# Patient Record
Sex: Female | Born: 1990 | Race: White | Hispanic: No | Marital: Single | Smoking: Never smoker
Health system: Southern US, Community
[De-identification: ages and names within clinical notes are randomized; demographics above are authoritative.]

## PROBLEM LIST (undated history)

## (undated) DIAGNOSIS — L0291 Cutaneous abscess, unspecified: Secondary | ICD-10-CM

## (undated) DIAGNOSIS — G43909 Migraine, unspecified, not intractable, without status migrainosus: Secondary | ICD-10-CM

## (undated) DIAGNOSIS — A6000 Herpesviral infection of urogenital system, unspecified: Secondary | ICD-10-CM

## (undated) DIAGNOSIS — Z9889 Other specified postprocedural states: Secondary | ICD-10-CM

## (undated) DIAGNOSIS — F329 Major depressive disorder, single episode, unspecified: Secondary | ICD-10-CM

## (undated) DIAGNOSIS — F319 Bipolar disorder, unspecified: Secondary | ICD-10-CM

## (undated) DIAGNOSIS — T8859XA Other complications of anesthesia, initial encounter: Secondary | ICD-10-CM

## (undated) DIAGNOSIS — F32A Depression, unspecified: Secondary | ICD-10-CM

## (undated) DIAGNOSIS — F419 Anxiety disorder, unspecified: Secondary | ICD-10-CM

## (undated) HISTORY — PX: CERVICAL DISC SURGERY: SHX588

## (undated) HISTORY — PX: BACK SURGERY: SHX140

## (undated) HISTORY — PX: MOUTH SURGERY: SHX715

---
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
## 2011-05-27 ENCOUNTER — Emergency Department: Payer: Self-pay | Admitting: Emergency Medicine

## 2011-07-07 ENCOUNTER — Ambulatory Visit: Payer: Self-pay | Admitting: Family Medicine

## 2011-11-13 ENCOUNTER — Observation Stay: Payer: Self-pay

## 2011-11-22 ENCOUNTER — Inpatient Hospital Stay: Payer: Self-pay | Admitting: Obstetrics and Gynecology

## 2011-11-22 LAB — CBC WITH DIFFERENTIAL/PLATELET
Basophil %: 0.2 %
Eosinophil #: 0.1 10*3/uL (ref 0.0–0.7)
Eosinophil %: 0.7 %
HCT: 38.4 % (ref 35.0–47.0)
HGB: 12.9 g/dL (ref 12.0–16.0)
Lymphocyte #: 1.8 10*3/uL (ref 1.0–3.6)
Lymphocyte %: 10.6 %
Monocyte #: 0.8 10*3/uL — ABNORMAL HIGH (ref 0.0–0.7)
Neutrophil #: 14.3 10*3/uL — ABNORMAL HIGH (ref 1.4–6.5)
Neutrophil %: 83.8 %
RBC: 4.12 10*6/uL (ref 3.80–5.20)

## 2011-11-23 LAB — URINALYSIS, COMPLETE
Blood: NEGATIVE
Leukocyte Esterase: NEGATIVE
Nitrite: NEGATIVE
Ph: 7 (ref 4.5–8.0)
Protein: NEGATIVE
Specific Gravity: 1.006 (ref 1.003–1.030)
WBC UR: 1 /HPF (ref 0–5)

## 2011-11-24 LAB — HEMATOCRIT: HCT: 34.9 % — ABNORMAL LOW (ref 35.0–47.0)

## 2011-11-25 LAB — URINE CULTURE

## 2012-10-02 ENCOUNTER — Emergency Department: Payer: Self-pay | Admitting: Emergency Medicine

## 2012-10-02 LAB — URINALYSIS, COMPLETE
Bilirubin,UR: NEGATIVE
Glucose,UR: NEGATIVE mg/dL (ref 0–75)
Ketone: NEGATIVE
Leukocyte Esterase: NEGATIVE
Ph: 5 (ref 4.5–8.0)
Squamous Epithelial: 2

## 2012-10-02 LAB — DRUG SCREEN, URINE
Amphetamines, Ur Screen: NEGATIVE (ref ?–1000)
Benzodiazepine, Ur Scrn: NEGATIVE (ref ?–200)
Cannabinoid 50 Ng, Ur ~~LOC~~: NEGATIVE (ref ?–50)
Cocaine Metabolite,Ur ~~LOC~~: NEGATIVE (ref ?–300)
MDMA (Ecstasy)Ur Screen: NEGATIVE (ref ?–500)
Opiate, Ur Screen: NEGATIVE (ref ?–300)
Phencyclidine (PCP) Ur S: NEGATIVE (ref ?–25)
Tricyclic, Ur Screen: NEGATIVE (ref ?–1000)

## 2012-10-02 LAB — COMPREHENSIVE METABOLIC PANEL
Albumin: 3.5 g/dL (ref 3.4–5.0)
Alkaline Phosphatase: 78 U/L (ref 50–136)
BUN: 10 mg/dL (ref 7–18)
Bilirubin,Total: 0.2 mg/dL (ref 0.2–1.0)
Calcium, Total: 8.9 mg/dL (ref 8.5–10.1)
Co2: 29 mmol/L (ref 21–32)
Creatinine: 0.78 mg/dL (ref 0.60–1.30)
EGFR (Non-African Amer.): >60
Osmolality: 283 (ref 275–301)
Potassium: 3.7 mmol/L (ref 3.5–5.1)
SGPT (ALT): 20 U/L (ref 12–78)
Sodium: 143 mmol/L (ref 136–145)

## 2012-10-02 LAB — CBC
HCT: 43.5 % (ref 35.0–47.0)
HGB: 14.6 g/dL (ref 12.0–16.0)
MCHC: 33.5 g/dL (ref 32.0–36.0)
Platelet: 315 10*3/uL (ref 150–440)
RBC: 4.83 10*6/uL (ref 3.80–5.20)

## 2012-10-02 LAB — ETHANOL: Ethanol: <3 mg/dL

## 2012-10-02 LAB — TSH: Thyroid Stimulating Horm: 1.71 u[IU]/mL

## 2012-10-02 LAB — ACETAMINOPHEN LEVEL: Acetaminophen: <2 ug/mL

## 2013-08-01 ENCOUNTER — Emergency Department: Payer: Self-pay | Admitting: Emergency Medicine

## 2014-05-25 ENCOUNTER — Emergency Department: Payer: Self-pay | Admitting: Emergency Medicine

## 2014-05-25 LAB — TROPONIN I: Troponin-I: <0.02 ng/mL

## 2014-05-25 LAB — BASIC METABOLIC PANEL
Anion Gap: 12 (ref 7–16)
BUN: 7 mg/dL (ref 7–18)
CHLORIDE: 105 mmol/L (ref 98–107)
Calcium, Total: 8.9 mg/dL (ref 8.5–10.1)
Co2: 24 mmol/L (ref 21–32)
Creatinine: 1.01 mg/dL (ref 0.60–1.30)
EGFR (Non-African Amer.): >60
Glucose: 95 mg/dL (ref 65–99)
Osmolality: 279 (ref 275–301)
POTASSIUM: 3.3 mmol/L — AB (ref 3.5–5.1)
SODIUM: 141 mmol/L (ref 136–145)

## 2014-05-25 LAB — CBC
HCT: 43.7 % (ref 35.0–47.0)
HGB: 15.1 g/dL (ref 12.0–16.0)
MCH: 31 pg (ref 26.0–34.0)
MCHC: 34.6 g/dL (ref 32.0–36.0)
MCV: 90 fL (ref 80–100)
PLATELETS: 340 10*3/uL (ref 150–440)
RBC: 4.87 10*6/uL (ref 3.80–5.20)
RDW: 12.7 % (ref 11.5–14.5)
WBC: 10.9 10*3/uL (ref 3.6–11.0)

## 2014-05-30 ENCOUNTER — Ambulatory Visit (INDEPENDENT_AMBULATORY_CARE_PROVIDER_SITE_OTHER): Payer: BC Managed Care – PPO | Admitting: Cardiovascular Disease

## 2014-05-30 ENCOUNTER — Encounter (INDEPENDENT_AMBULATORY_CARE_PROVIDER_SITE_OTHER): Payer: Self-pay

## 2014-05-30 ENCOUNTER — Encounter: Payer: Self-pay | Admitting: Cardiovascular Disease

## 2014-05-30 VITALS — BP 118/82 | HR 100 | Ht 63.0 in | Wt 163.2 lb

## 2014-05-30 DIAGNOSIS — R0789 Other chest pain: Secondary | ICD-10-CM | POA: Insufficient documentation

## 2014-05-30 DIAGNOSIS — F411 Generalized anxiety disorder: Secondary | ICD-10-CM

## 2014-05-30 DIAGNOSIS — F419 Anxiety disorder, unspecified: Secondary | ICD-10-CM | POA: Insufficient documentation

## 2014-05-30 DIAGNOSIS — R0602 Shortness of breath: Secondary | ICD-10-CM | POA: Insufficient documentation

## 2014-05-30 DIAGNOSIS — R000 Tachycardia, unspecified: Secondary | ICD-10-CM | POA: Insufficient documentation

## 2014-05-30 DIAGNOSIS — F410 Panic disorder [episodic paroxysmal anxiety] without agoraphobia: Secondary | ICD-10-CM

## 2014-05-30 NOTE — Progress Notes (Signed)
Patient ID: [name], female    DOB: 28-Sep-1991, 23 y.o.   MRN: [id number]  HPI Comments: [name] is a pleasant 23 year old woman with long history of general anxiety disorder, depression, migraines, seen at the [facility] who presents to the office for new patient evaluation after being seen in the emergency room for chest pain.  She states that for several years she has had difficulty with anxiety and insomnia.  Symptoms have been worse in the past month or so since her brother moved into her mother's house. Now there are 3 families living under one roof. The patient and HER-22-year-old child share one room, brother and their child share another room. She states it is very stressful. There are frequent arguments. [place] and does not like to go walking outside her house as she is frequently approached by strange men. He does not have a car and unable to work at this time. She does not have the financial means or transportation to enjoy herself. She states that her father is relatively supportive, mother is not very supportive and often has many complaints of her own.  She was previously in therapy, previously on medications including Remeron for sleep and depression but this made her feel like a zombie.  In the past month has been having chest squeezing, heart pounding, episodes of shortness of breath even at rest, palpitations, episodes of dizziness, worsening headaches, insomnia.  She was seen in the emergency room 05/25/2014 for the symptoms above. Workup was essentially normal, potassium 3.3 otherwise everything was within normal limits EKG was normal, heart rate 100 beats per minute EKG today shows normal sinus rhythm with rate 97 beats per minute with no significant ST or T wave changes  From the emergency room she was given Toradol, Valium and Phenergan for nausea. This has not been helping her symptoms She reports that she also has a prescription  for Flexeril and this has also not been helping her chest tightness  Orthostatics were done that showed mild drop in her pressure from supine to standing position, change from 093 systolic down to 235, minimal increase in her heart rate up from 96 beats per minute up to 102   Outpatient Encounter Prescriptions as of 05/30/2014  Medication Sig  . diazepam (VALIUM) 5 MG tablet Take 5 mg by mouth every 6 (six) hours as needed.   Marland Kitchen ketorolac (TORADOL) 10 MG tablet Take 10 mg by mouth every 6 (six) hours.   . promethazine (PHENERGAN) 25 MG tablet Take 25 mg by mouth every 6 (six) hours as needed for nausea or vomiting.     Review of Systems  Constitutional: Negative.   HENT: Negative.   Eyes: Negative.   Respiratory: Positive for chest tightness and shortness of breath.   Cardiovascular: Positive for chest pain.  Gastrointestinal: Negative.   Endocrine: Negative.   Musculoskeletal: Negative.   Skin: Negative.   Allergic/Immunologic: Negative.   Neurological: Positive for dizziness.  Hematological: Negative.   Psychiatric/Behavioral: Positive for sleep disturbance, dysphoric mood and decreased concentration. The patient is nervous/anxious.   All other systems reviewed and are negative.   BP 118/82  Pulse 100  Ht $R'5\' 3"'yE$  (1.6 m)  Wt 163 lb 4 oz (74.05 kg)  BMI 28.93 kg/m2  Physical Exam  Nursing note and vitals reviewed. Constitutional: She is oriented to person, place, and time. She appears well-developed and well-nourished.  HENT:  Head: Normocephalic.  Nose: Nose  normal.  Mouth/Throat: Oropharynx is clear and moist.  Eyes: Conjunctivae are normal. Pupils are equal, round, and reactive to light.  Neck: Normal range of motion. Neck supple. No JVD present.  Cardiovascular: Normal rate, regular rhythm, normal heart sounds and intact distal pulses.  Exam reveals no gallop and no friction rub.   No murmur heard. Pulmonary/Chest: Effort normal and breath sounds normal. No respiratory  distress. She has no wheezes. She has no rales. She exhibits no tenderness.  Abdominal: Soft. Bowel sounds are normal. She exhibits no distension. There is no tenderness.  Musculoskeletal: Normal range of motion. She exhibits no edema and no tenderness.  Lymphadenopathy:    She has no cervical adenopathy.  Neurological: She is alert and oriented to person, place, and time. Coordination normal.  Skin: Skin is warm and dry. No rash noted. No erythema.  Psychiatric: She has a normal mood and affect. Her behavior is normal. Judgment and thought content normal.    Assessment and Plan

## 2014-05-30 NOTE — Assessment & Plan Note (Signed)
She reports having a history of panic attack. One episode while she was at her last job. Suspect she will need medical management with prescription medications given the severity of her symptoms

## 2014-05-30 NOTE — Assessment & Plan Note (Signed)
Shortness of breath here in the exam room at rest likely from anxiety and stress at home. Unable to do daily exercise given transportation issues. Will likely require pharmacologic therapy

## 2014-05-30 NOTE — Patient Instructions (Addendum)
Your heart is doing well No medication changes were made.  Numbers for psychiatry: [facility] [address]  [place] [phone] Walk-in clinic Mon, Wed, Fri 8-3  Download the apps on your phone: Search for "pulse meter" Download "instant heart rate"

## 2014-05-30 NOTE — Assessment & Plan Note (Signed)
She appreciates tachycardia. She'll monitor her heart rate using her phone. Sinus tachycardia noted in the office today. Again likely from anxiety. If she does appreciate heart rates more than 130 at rest, suggested she call us. Holter monitor could be ordered

## 2014-05-30 NOTE — Assessment & Plan Note (Signed)
Most of her symptoms are likely secondary to underlying anxiety and stress at home. Also suspect she has general anxiety disorder. Clinical exam is essentially benign, normal EKG. No significant risk factors, no smoking, no diabetes. No further workup at this time

## 2014-05-30 NOTE — Assessment & Plan Note (Signed)
Long history of anxiety per the patient. Recommended she follow up with psychiatry. Number will be provided today

## 2014-11-08 ENCOUNTER — Inpatient Hospital Stay: Payer: Self-pay | Admitting: Internal Medicine

## 2014-11-08 LAB — CBC WITH DIFFERENTIAL/PLATELET
Basophil #: 0 10*3/uL (ref 0.0–0.1)
Basophil %: 0.2 %
Eosinophil #: 0 10*3/uL (ref 0.0–0.7)
Eosinophil %: 0 %
HCT: 42.7 % (ref 35.0–47.0)
HGB: 14.4 g/dL (ref 12.0–16.0)
LYMPHS ABS: 2.1 10*3/uL (ref 1.0–3.6)
LYMPHS PCT: 17.2 %
MCH: 30.7 pg (ref 26.0–34.0)
MCHC: 33.7 g/dL (ref 32.0–36.0)
MCV: 91 fL (ref 80–100)
Monocyte #: 1.1 x10 3/mm — ABNORMAL HIGH (ref 0.2–0.9)
Monocyte %: 8.5 %
Neutrophil #: 9.2 10*3/uL — ABNORMAL HIGH (ref 1.4–6.5)
Neutrophil %: 74.1 %
Platelet: 303 10*3/uL (ref 150–440)
RBC: 4.69 10*6/uL (ref 3.80–5.20)
RDW: 12.7 % (ref 11.5–14.5)
WBC: 12.4 10*3/uL — AB (ref 3.6–11.0)

## 2014-11-08 LAB — URINALYSIS, COMPLETE
RBC,UR: 373 /HPF (ref 0–5)
Specific Gravity: 1.027 (ref 1.003–1.030)
Squamous Epithelial: 150
WBC UR: 6329 /HPF (ref 0–5)

## 2014-11-08 LAB — COMPREHENSIVE METABOLIC PANEL
ALBUMIN: 4 g/dL (ref 3.4–5.0)
ALK PHOS: 86 U/L
Anion Gap: 10 (ref 7–16)
BUN: 13 mg/dL (ref 7–18)
Bilirubin,Total: 0.7 mg/dL (ref 0.2–1.0)
CREATININE: 1.11 mg/dL (ref 0.60–1.30)
Calcium, Total: 9.1 mg/dL (ref 8.5–10.1)
Chloride: 103 mmol/L (ref 98–107)
Co2: 25 mmol/L (ref 21–32)
EGFR (African American): >60
EGFR (Non-African Amer.): >60
GLUCOSE: 103 mg/dL — AB (ref 65–99)
OSMOLALITY: 276 (ref 275–301)
Potassium: 3.8 mmol/L (ref 3.5–5.1)
SGOT(AST): 27 U/L (ref 15–37)
SGPT (ALT): 26 U/L
SODIUM: 138 mmol/L (ref 136–145)
TOTAL PROTEIN: 8.4 g/dL — AB (ref 6.4–8.2)

## 2014-11-08 LAB — PREGNANCY, URINE: Pregnancy Test, Urine: NEGATIVE m[IU]/mL

## 2014-11-09 LAB — BASIC METABOLIC PANEL
ANION GAP: 6 — AB (ref 7–16)
BUN: 10 mg/dL (ref 7–18)
CO2: 27 mmol/L (ref 21–32)
Calcium, Total: 7.9 mg/dL — ABNORMAL LOW (ref 8.5–10.1)
Chloride: 107 mmol/L (ref 98–107)
Creatinine: 0.95 mg/dL (ref 0.60–1.30)
EGFR (African American): >60
EGFR (Non-African Amer.): >60
Glucose: 110 mg/dL — ABNORMAL HIGH (ref 65–99)
Osmolality: 279 (ref 275–301)
Potassium: 3.8 mmol/L (ref 3.5–5.1)
SODIUM: 140 mmol/L (ref 136–145)

## 2014-11-09 LAB — CBC WITH DIFFERENTIAL/PLATELET
Basophil #: 0 10*3/uL (ref 0.0–0.1)
Basophil %: 0.4 %
EOS PCT: 0.1 %
Eosinophil #: 0 10*3/uL (ref 0.0–0.7)
HCT: 36.5 % (ref 35.0–47.0)
HGB: 12.1 g/dL (ref 12.0–16.0)
LYMPHS ABS: 2.8 10*3/uL (ref 1.0–3.6)
Lymphocyte %: 33.1 %
MCH: 30.8 pg (ref 26.0–34.0)
MCHC: 33.3 g/dL (ref 32.0–36.0)
MCV: 93 fL (ref 80–100)
MONO ABS: 0.7 x10 3/mm (ref 0.2–0.9)
Monocyte %: 8.8 %
NEUTROS ABS: 4.9 10*3/uL (ref 1.4–6.5)
Neutrophil %: 57.6 %
Platelet: 212 10*3/uL (ref 150–440)
RBC: 3.95 10*6/uL (ref 3.80–5.20)
RDW: 12.6 % (ref 11.5–14.5)
WBC: 8.5 10*3/uL (ref 3.6–11.0)

## 2014-11-10 LAB — URINE CULTURE

## 2014-11-10 LAB — GC/CHLAMYDIA PROBE AMP

## 2014-11-10 LAB — ALT: SGPT (ALT): 43 U/L

## 2014-11-10 LAB — ALKALINE PHOSPHATASE: ALK PHOS: 70 U/L

## 2014-11-10 LAB — SGOT (AST)(ARMC): SGOT(AST): 42 U/L — ABNORMAL HIGH (ref 15–37)

## 2014-11-11 LAB — ALT: ALT: 48 U/L

## 2014-11-11 LAB — SGOT (AST)(ARMC): AST: 38 U/L — AB (ref 15–37)

## 2014-11-12 LAB — BASIC METABOLIC PANEL
Anion Gap: 6 — ABNORMAL LOW (ref 7–16)
BUN: 8 mg/dL (ref 7–18)
CALCIUM: 8.4 mg/dL — AB (ref 8.5–10.1)
Chloride: 108 mmol/L — ABNORMAL HIGH (ref 98–107)
Co2: 29 mmol/L (ref 21–32)
Creatinine: 0.81 mg/dL (ref 0.60–1.30)
EGFR (African American): >60
GLUCOSE: 79 mg/dL (ref 65–99)
Osmolality: 282 (ref 275–301)
POTASSIUM: 3.8 mmol/L (ref 3.5–5.1)
Sodium: 143 mmol/L (ref 136–145)

## 2014-11-12 LAB — CBC WITH DIFFERENTIAL/PLATELET
BASOS PCT: 0.5 %
Basophil #: 0 10*3/uL (ref 0.0–0.1)
EOS PCT: 3 %
Eosinophil #: 0.2 10*3/uL (ref 0.0–0.7)
HCT: 33.9 % — ABNORMAL LOW (ref 35.0–47.0)
HGB: 11.4 g/dL — AB (ref 12.0–16.0)
LYMPHS ABS: 2.5 10*3/uL (ref 1.0–3.6)
Lymphocyte %: 42.4 %
MCH: 30.7 pg (ref 26.0–34.0)
MCHC: 33.6 g/dL (ref 32.0–36.0)
MCV: 92 fL (ref 80–100)
Monocyte #: 0.6 x10 3/mm (ref 0.2–0.9)
Monocyte %: 9.6 %
NEUTROS ABS: 2.6 10*3/uL (ref 1.4–6.5)
NEUTROS PCT: 44.5 %
Platelet: 215 10*3/uL (ref 150–440)
RBC: 3.71 10*6/uL — AB (ref 3.80–5.20)
RDW: 12.3 % (ref 11.5–14.5)
WBC: 5.9 10*3/uL (ref 3.6–11.0)

## 2014-11-13 LAB — RAPID HIV SCREEN (HIV 1/2 AB+AG)

## 2014-11-15 LAB — BASIC METABOLIC PANEL
ANION GAP: 7 (ref 7–16)
BUN: 16 mg/dL (ref 7–18)
CO2: 28 mmol/L (ref 21–32)
Calcium, Total: 9.1 mg/dL (ref 8.5–10.1)
Chloride: 105 mmol/L (ref 98–107)
Creatinine: 0.94 mg/dL (ref 0.60–1.30)
EGFR (Non-African Amer.): >60
GLUCOSE: 90 mg/dL (ref 65–99)
Osmolality: 280 (ref 275–301)
Potassium: 4 mmol/L (ref 3.5–5.1)
Sodium: 140 mmol/L (ref 136–145)

## 2014-11-17 LAB — MISC AER/ANAEROBIC CULT.

## 2015-02-17 NOTE — Discharge Summary (Signed)
PATIENT NAME:  [name], [name] MR#:  [id number] DATE OF BIRTH:  1991-04-07  DATE OF ADMISSION:  11/08/2014 DATE OF DISCHARGE:  11/16/2014  DISCHARGE DIAGNOSES: 1.  Sepsis secondary to acute pyelonephritis.  2.  Vulvar edema with pustular lesions.  SECONDARY DIAGNOSES: Bipolar disorder with depression and anxiety.    CONSULTATIONS: 1.  Infectious disease, Stann MainlandDavid P. Sampson GoonFitzgerald, MD.  2.  OB/GYN, [place] Bingharlie Pickens, MD.  3.  Dermatology, [place] Astersavid A. Dasher, MD.   PROCEDURES AND RADIOLOGY:  CT scan of the abdomen and pelvis with contrast on January 23 showed no acute abnormality.   Major laboratory panel: UA on admission showed 6329 WBCs, 3+ bacteria.   Urine culture was contaminated.   Right thigh ulcer wound culture was consistent with normal flora.   Urine pregnancy test was negative. HIV antibodies were nonreactive. Chlamydia trachomatis was negative, along with gonorrhea which was also negative.   HSV IgG was negative. HSV IgM was negative. HSV IgM 1/2 ratio was elevated with a value of 2.86. HSV-1 and -2 DNA were negative.   RPR was nonreactive.   CMV antibodies were negative for IgM. CMV IgG was elevated with a value of 7.6. EBV IgM and IgG early antigen were negative, but EBV VCA antibody was elevated with a value of 61.3. EBV nuclear antigen antibodies IgG was elevated with a value of 410.   HISTORY AND SHORT HOSPITAL COURSE: The patient is a 24 year old female with the above-mentioned medical problems who was admitted for dysuria and right flank pain. She was found to have early sepsis secondary to acute pyelonephritis. Please see Dr. Thomasena Edisadhika Kalisetti'Ward dictated history and physical for further details. OB/GYN consultation was obtained with Dr. [place] Bingharlie Pickens for vulvar swelling and discharge, who felt this to be HSV infection and the patient was started on acyclovir. She was found to have multiple vesicular lesions worrisome for herpes simplex. The patient was not getting  significantly better with ongoing treatment with acyclovir for which infectious disease consultation was obtained along with dermatology consult. Dr. Sampson GoonFitzgerald recommended treating the patient'Ward sepsis due to UTI with ciprofloxacin, and he also recommended continuing acyclovir to finish the course for possible herpes simplex. Dermatology consultation was obtained with Dr. Pilar Plateavid Dasher from [facility], who recommended possible punch biopsy, but the patient refused. He recommended further different virus cultures which were ordered, some of which seemed to be positive for IgG antibodies. On the day of patient'Ward discharge, the patient remained afebrile for almost 48 hours before discharge and she was feeling a whole lot better by January 29. She was started on prednisone taper by Dr. Sampson GoonFitzgerald due to vulvar edema which was not improving much, and steroid did seem to help her.  DISCHARGE VITAL SIGNS: On the date of discharge, her vital signs were as follows: Temperature 98.2, heart rate 90 per minute, respirations 18 per minute, blood pressure 100/60. She was saturating 97% on room air.   PERTINENT PHYSICAL EXAMINATION ON THE DATE OF DISCHARGE:  CARDIOVASCULAR: S1, S2 normal. No murmurs, rubs, or gallop.  LUNGS: Clear to auscultation bilaterally. No wheezing, rales, rhonchi, or crepitation.  ABDOMEN: Soft, benign.  NEUROLOGIC: Nonfocal examination. GENITOURINARY: The patient'Ward vulvar area shows multiple 20 to 30 discreet 2 to 5 mm vesicular, some hemorrhagic, and erosions involving the vulva. Moderate vulvar edema also was noted.  All other physical examination remained at baseline.   DISCHARGE MEDICATIONS:   Medication Instructions  seroquel xr 400 mg oral tablet, extended release  1 tab(Ward) orally once a  day (at bedtime)   azo urinary pain relief 97.5 mg oral tablet  2 tab(Ward) orally every 6 hours, As Needed for UTI symptoms.    sprintec 35 mcg-0.25 mg oral tablet  1 tab(Ward) orally once a  day   ciprofloxacin 500 mg oral tablet  1 tab(Ward) orally every 12 hours x 5 days   acyclovir 400 mg oral tablet  1 tab(Ward) orally every 8 hours x 3 days   prednisone 10 mg oral tablet  Start at 30 mg and taper by 10 mg daily until complete    DISCHARGE DIET: Regular.   DISCHARGE ACTIVITY: As tolerated.  DISCHARGE INSTRUCTIONS AND FOLLOWUP: The patient was instructed to follow up with her primary care physician doctor in 1 week. She was instructed to follow up with Dr. Clydie Braun from infectious disease in 1 to 2 weeks, with Dr. Pilar Plate from [facility] in 2 to 4 weeks, and with [facility] in 4 to 6 weeks.  TOTAL TIME SPENT: On this patient was 45 minutes.   ____________________________ Ellamae Sia. Sherryll Burger, MD vss:ST D: 11/19/2014 22:56:11 ET T: 11/19/2014 23:32:44 ET JOB#: [id number]  cc: Camilah Spillman Ward. Sherryll Burger, MD, <Dictator> Primary care physician Stann Mainland. Sampson Goon, MD [place] Asters Dasher, MD [facility] Bing, MD  Ellamae Sia [facility] MD ELECTRONICALLY SIGNED 11/20/2014 10:11

## 2015-02-17 NOTE — H&P (Signed)
PATIENT NAME:  [name], [name] MR#:  [id number] DATE OF BIRTH:  11/10/1990  DATE OF ADMISSION:  11/08/2014  ADMITTING PHYSICIAN:  , MD .   PRIMARY CARE PHYSICIAN: Nonlocal.   CHIEF COMPLAINT: Dysuria and right flank pain.   HISTORY OF PRESENT ILLNESS: [name] is a 23-year-old Caucasian female with past medical history significant for bipolar, depression, anxiety, who presents to the hospital secondary to worsening dysuria and also right flank pain. The patient said she developed vaginal discharge about 4 days ago. She is not sexually active currently. She was asymptomatic. Two days after that she found to have significant dysuria, chills at home, so went to her physician and was started on Cipro twice a day. She just got it filled yesterday, started taking it, but her discomfort, dysuria has been worsening. She has nausea, unable to take her medications, very dehydrated, so presents to the hospital. Now she also has right-sided flank ankle. She is tachycardic, has a white and urine shows significant UTI. So she is being admitted for early sepsis from acute pyelonephritis.   PAST MEDICAL HISTORY: Bipolar disorder with depression and anxiety.   PAST SURGICAL HISTORY: None other than a dental surgery.   ALLERGIES TO MEDICATIONS: No known drug allergies.   CURRENT HOME MEDICATIONS:  1.  She was started on the Cipro 250 b.i.d., which she just started taking yesterday.  2.  She was also started on Azo for urinary pain relief, which she just started taking yesterday.  3.  She takes Seroquel XR 400 mg p.o. at bedtime.   SOCIAL HISTORY: Lives at home with her parents and also has a 3-year-old daughter. No smoking. Occasional alcohol use.   FAMILY HISTORY: Significant for cancer, diabetes and hypertension in the family.   REVIEW OF SYSTEMS: CONSTITUTIONAL: Positive for fever and chills. No fatigue or weakness.  EYES: No blurred vision, double vision, inflammation or glaucoma.   EARS, NOSE, AND THROAT: No tinnitus, ear pain, hearing loss, epistaxis or discharge.  RESPIRATORY: No cough, wheeze, hemoptysis or COPD.  CARDIOVASCULAR: No chest pain, orthopnea, edema, arrhythmia, palpitations or syncope.  GASTROINTESTINAL: Positive for nausea. No vomiting, diarrhea, abdominal pain, hematemesis, or melena.  GENITOURINARY: Significant dysuria, decreased frequency of urination. No hematuria, no incontinence.  ENDOCRINE: No polyuria, nocturia, thyroid problems, heat or cold intolerance.  HEMATOLOGY: No anemia, easy bruising or bleeding.  SKIN: No acne, rash or lesions.  MUSCULOSKELETAL: No neck, back, shoulder pain, arthritis or gout.  NEUROLOGIC: No numbness, weakness, CVA, TIA or seizures.  PSYCHOLOGICAL: No anxiety, insomnia, depression.   PHYSICAL EXAMINATION:  VITAL SIGNS: Temperature 98.2 degrees Fahrenheit, pulse 116, respirations 18, blood pressure 109/78, pulse of 99% on room air.  GENERAL: Well-built, well-nourished female lying in bed, not in any acute distress.  HEENT: Normocephalic, atraumatic. Pupils equal, round, reacting to light. Anicteric sclerae. Extraocular movements intact.  OROPHARYNX: Clear without erythema, mass or exudates.  NECK: Supple. No thyromegaly, JVD or carotid bruits. No lymphadenopathy. LUNGS: Moving air bilaterally. No wheeze or crackles. No use of accessory muscles for breathing.  CARDIOVASCULAR: S1, S2, regular rate and rhythm. No murmurs, rubs or gallops.  ABDOMEN: Soft, nontender, nondistended. Some discomfort in the right CVA with tenderness. No rebound tenderness. No guarding or rigidity. Normal bowel sounds.  EXTREMITIES: No pedal edema. No clubbing or cyanosis, 2+ dorsalis pedis pulses palpable bilaterally.  SKIN: No acne, rash or lesions.  LYMPHATICS: No cervical or inguinal lymphadenopathy.  NEUROLOGICAL: Cranial nerves intact. No focal motor or sensory deficits. PSYCHOLOGICAL:   The patient is awake, alert, oriented x 3.    LABORATORY DATA: WBC 12.4, hemoglobin 14.4, hematocrit 42.7, platelet count 303,000.   Sodium 138, potassium 3.8, chloride 103, bicarb 25, BUN 13, creatinine 1.1, glucose 103, calcium of 9.1.   ALT 26, AST 27, alk phos 86, total bili 0.7, albumin of 4.0. Pregnancy test is negative. Urinalysis with 3+ bacteria, 6000 wbc'Ward, epithelial cells noted. Urine was significantly orange, cloudy with mucus present. Lactic acid is within normal limits.   ASSESSMENT AND PLAN: A 23-year-old female with history of bipolar, depression admitted for acute pyelonephritis.  1.  Early sepsis secondary to acute pyelonephritis. Failed outpatient antibiotic treatment with Cipro. We will admit, blood and urine cultures, IV fluids, started on Pyridium for discomfort and also Rocephin. No CT done. If symptoms do not improve by tomorrow, consider CT abdomen.  2.  Bipolar with depression and anxiety. Stable. Continue on her Seroquel.   CODE STATUS: Full code.   TIME SPENT ON ADMISSION: Fifty minutes.    ____________________________  , MD rk:TT D: 11/08/2014 19:39:58 ET T: 11/08/2014 20:08:37 ET JOB#: [id number]  cc:  , MD, <Dictator>   MD ELECTRONICALLY SIGNED 11/11/2014 14:37 

## 2015-02-17 NOTE — Consult Note (Signed)
Chief Complaint:  Subjective/Chief Complaint Feels about the same.  Has noted no significant change in vulvar edema or lesions.   VITAL SIGNS/ANCILLARY NOTES: **Vital Signs.:   25-Jan-16 00:26  Vital Signs Type Q 8hr  Temperature Temperature (F) 98.2  Celsius 36.7  Temperature Source oral  Pulse Pulse 101  Respirations Respirations 18  Systolic BP Systolic BP 110  Diastolic BP (mmHg) Diastolic BP (mmHg) 65  Mean BP 80  Pulse Ox % Pulse Ox % 96  Pulse Ox Activity Level  At rest  Oxygen Delivery Room Air/ 21 %    07:53  Temperature Temperature (F) 98.3  Celsius 36.8  Pulse Pulse 93  Respirations Respirations 16  Systolic BP Systolic BP 104  Diastolic BP (mmHg) Diastolic BP (mmHg) 66  Mean BP 78  Pulse Ox % Pulse Ox % 96  Pulse Ox Activity Level  At rest  Oxygen Delivery Room Air/ 21 %   Brief Assessment:  GEN no acute distress   Additional Physical Exam HEENT: normal oropharynx, no oral lesions Genitourinary: Significant edema involving labia minora and majora with several vesicuar lesions as well as some small ulcerations involving labia minor, majora, and thighs.  Foley in place   Lab Results: Routine Hem:  21-Jan-16 17:19   WBC (CBC)  12.4  22-Jan-16 04:56   WBC (CBC) 8.5  25-Jan-16 05:18   WBC (CBC) 5.9   Assessment/Plan:  Assessment/Plan:  Assessment 24 yo with vulvar lesions being presumptively treated fo HSV   Plan - HSV PCR and IgG negative, IgM pending - Given no intercourse in the last year and negative IgG would have to consider etiologies outside of HSV.  These include vulvar Bahcet's disease, staph infection, or candida.  I will place a dermatology consult to get their input while IgM is pending - at present continue current plan of managment   Electronic Signatures: Lorrene ReidStaebler, Goddess Gebbia M (MD)  (Signed 25-Jan-16 09:05)  Authored: Chief Complaint, VITAL SIGNS/ANCILLARY NOTES, Brief Assessment, Lab Results, Assessment/Plan   Last Updated: 25-Jan-16  09:05 by Lorrene ReidStaebler, Mekiah Cambridge M (MD)

## 2015-02-17 NOTE — Consult Note (Signed)
PATIENT NAME:  [name], [name] DATE OF BIRTH:  Nov 17, 1990  DATE OF CONSULTATION:  11/12/2014  REFERRING PHYSICIAN:  Enid Baasadhika Kalisetti, MD  CONSULTING PHYSICIAN:  Stann Mainlandavid P. Sampson GoonFitzgerald, MD  REASON FOR CONSULTATION:  Pyelonephritis and vaginal lesions.   HISTORY OF PRESENT ILLNESS:  This is a very pleasant 24 year old female with history of bipolar disorder and depression, who presented to the hospital January 21 with dysuria and right flank pain. She reports initially that she developed a mild vaginal discharge. She then started to have some dysuria and presented to her primary care office. They started her on ciprofloxacin. However, the dysuria and flank pain worsened, and she developed nausea and decreased p.o. intake. She also developed some swelling on her external genitalia. She was admitted January 21 with tachycardia and increased white count. Since then, she has been seen by gynecology. Clinically, she reports some mild improvement in her dysuria and back pain, but her vaginal swelling persists.   PAST MEDICAL HISTORY: Bipolar with depression and anxiety.   PAST SURGICAL HISTORY:  None.   ALLERGIES:  No known drug allergies.   SOCIAL HISTORY:  Lives with her parents and her 23-year-old daughter. She is seeking work and just had a job interview. She does not smoke. Occasional alcohol use.   FAMILY HISTORY:  Noncontributory.   REVIEW OF SYSTEMS:  Eleven systems reviewed and negative except as per HPI.  PHYSICAL EXAMINATION: VITAL SIGNS:  Temperature 98.3, pulse 93, blood pressure 104/66, respirations 16, saturation 96% on room air. Maximum temperature since admission was 102 on January 23.  GENERAL:  She is pleasant, interactive, and alert. She is walking around the room.  HEENT:  Pupils are reactive. Extraocular movements are intact. Sclerae are anicteric. Oropharynx is clear.  NECK:  Supple.  HEART:  Regular.  LUNGS:  Clear.  ABDOMEN:  Soft, nontender,  nondistended. No hepatosplenomegaly.  EXTREMITIES:  No clubbing, cyanosis, or edema.  GENITOURINARY:  She has a Foley catheter in place. There is marked swelling of her vulva and perineum. She has what appear to be pedunculated umbilicated lesions on her labia and perineum. There is no active ulceration.  NEUROLOGIC:  She is alert and oriented x 3. Grossly nonfocal neurological exam.   LABORATORY DATA:  White blood count on admission was 12.4 and currently 5.9, hemoglobin 11.4, and platelets 215,000. Her renal function shows a creatinine of 0.81. LFTs were normal. Urinalysis done January 21 showed 6329 white cells, 373 red cells, and 3+ bacteria. Mucus was present. HSV swab of the genital lesions was negative by PCR. HSV-1 and HSV-2 IgG antibodies were negative. HSV type specific IgM antibodies are pending. HIV testing was not done. Gonorrhea and Chlamydia Gen-Probe were negative. Pregnancy test was negative.   CT of the abdomen and pelvis showed no acute abdominal abnormalities.   IMPRESSION:  This is a 24 year old female admitted with pyelonephritis. She also has marked labial swelling and erythema. She has umbilicated lesions on her genitalia as well. This appears somewhat like molluscum contagiosum.   RECOMMENDATIONS: 1.  Continue current antibiotics.  2.  Check HIV/RPR 3.  Continue local care of the lesions per gynecology. 4.  Would discontinue acyclovir, as PCR is negative.  5.  Start cipro for UTI (was initially on ceftriaxone but changed to keflex)   ____________________________ Stann Mainlandavid P. Sampson GoonFitzgerald, MD dpf:nb D: 11/12/2014 21:35:42 ET T: 11/12/2014 23:22:06 ET JOB#: [id number]  cc: Stann Mainlandavid P. Sampson GoonFitzgerald, MD, <Dictator> Kelda Azad Sampson GoonFITZGERALD MD ELECTRONICALLY SIGNED 11/13/2014 9:52

## 2015-02-17 NOTE — Consult Note (Signed)
OBGYN Consult Noteof Consult: 1/22/16of Admission 1/21/2015Service: Medicine, Dr. Maryruth HancockGouruOBGYN: none complaint: vulvar swelling23 y/o G1P1001 (LMP 3 weeks ago) with the above CC. PMHx unremarkable. Patient had UTI s/s, such as dysuria, starting on 1/19, with some clear/white, non malodorous vaginal discharge starting a two days prior; she sometimes has this type of discharge prior to her periods, so she thought it was that.  She was prescribed cipro on 1/19 but her s/s got worse so she was further evaluated in the ER and was admitted for UTI/pyelo and early sepsis and started on rocephin.  She states that she feels better and denies any constitutional s/s and her right sided back pain is gone. TSVD 2013, no problems. Periods are regular, qmonth but can be irregular if patient is stressed. Started OCPs a few days.bipolar, anxiety, depresionoralno female cancersno tobacco, rare etoh and no illicit drug use. single. not currently working. Last intercourse approximately 1 year agoprior to admission: OCP (pt unsure of name), seroquel, cipro  AF (Tmax: 98.7)  100s-120s  90s-130s/60s-80s (98/62, 96/64) 96/RA 18no inguinal LAD. vulva is edematous (RL 6x2.5x4cm swelling>LR 4 x 2 x 4cm) that appears to involve the whole vulva but doesn't track near her rectur. Vulva is very TTP and there is slight yellowish discharge (unclear if uretheral vs vaginal). Patient very uncomfortable with swabs for GC/CT and wet prep so bimanual or speculum exam not done.  Patient is shaven with several, diffuse, small well healed excoriations.  These appear to be healing well and not to be infected. RRR no MRGssoft, NTTP, ND, +BSno CVATno c/c/e WBC 8.5 from 12.4 and Cr 0.9 from 1.1. UPT negativeprep by me: rare fungal elements, normal squamous epithelium, numerous WBCs, no RBCs.none patient with new onset vulvar swelling and discharge, with pyelo; pt is currently stableof etiology. excoriations from shaving are well healed and don't appear to  be infected. PID is low on differential given no abdominal pain and negative abdominal exam and, per patient, no intercourse in last year. Also, patient states the swelling started prior to hospital admission and foley placement, which is a non latex one.  Possibly could be really bad candidasis infection.  Will come by later tonight and assess to ensure stability and start diflucan 150mg  po x 2 (72hours apart). If swelling gets worse or any other concerning features, will consider CT scan. Will follow up GC/CT swab.  of care d/w Dr. Amado CoeGouru      Electronic Signatures: [place] BingPickens, Aften Lipsey (MD) (Signed on 22-Jan-16 16:37)  Authored   Last Updated: 26-Jan-16 12:46 by [place] BingPickens, Ralph Benavidez (MD)

## 2015-02-17 NOTE — Consult Note (Signed)
Details:   - GYN Consult Note 24 y/o G1P1001 (LMP 4 weeks ago) HD#6 admitted for pyelo, early sepsis. GYN consulted HD#2 for vulvar swelling. Foley still in place S: patient states vulvar swelling and pain are stable (no worse/no better) O: AF VS normal and stable with just mild tachycardia in the low 100s NAD, patient crying/upset due to possibility of HSV infection GU: limited due to above; lesions (vs when I first consulted on her) look different with black/scab crusted over them; difficult to tell if they are weeping but don't appear to be. vulvar edema appears worse in that on the left side at the inferior aspect, it appears to more edematous. still ttp.  Labs: IgM combination HSV test +  A/P: pt stable Patient is s/p ID consult yesterday and Dermatology just got done seeing her with plans for biopsy. Per ID, the +test has possibility to be + due to cross-reactivity from other sources. Perplexing that HSV swab was negative, especially given all her other labs point to this as a primary infection, if this is indeed an HSV cause. Dermatology to come by and biopsy it again and possibly reswab. As for her lesions looking different to me today vs on initial consultation, it's likely that I saw them just before they worsened and hopefully since the left labia only looks slightly different and she, subjectively, states that her discomfort is stable, that her disease progression isn't worsening.  Will continue to follow; appreciate excellent care from primary service and consultants.   Electronic Signatures: [place] BingPickens, Jaquavious Mercer (MD)  (Signed 26-Jan-16 12:55)  Authored: Details   Last Updated: 26-Jan-16 12:55 by [place] BingPickens, Teiana Hajduk (MD)

## 2015-02-17 NOTE — Consult Note (Signed)
Chief Complaint:  Subjective/Chief Complaint Patient with decreased vulvar and lower abdominal pain today.  States that she is mostly tired.  Denies sexual intercourse for > 1 year.  She had a fever this morning to 102.  Has come down slightly now, but still febrile.  Denies history of HSV.  Catheter placed yesterday with return of 1,000 ml urine.  Now is clear.   VITAL SIGNS/ANCILLARY NOTES: **Vital Signs.:   23-Jan-16 00:57  Vital Signs Type Q 8hr  Temperature Temperature (F) 99.6  Celsius 37.5  Temperature Source oral  Pulse Pulse 109  Respirations Respirations 18  Systolic BP Systolic BP 120  Diastolic BP (mmHg) Diastolic BP (mmHg) 70  Mean BP 86  Pulse Ox % Pulse Ox % 95  Pulse Ox Activity Level  At rest  Oxygen Delivery Room Air/ 21 %    08:28  Vital Signs Type Q 8hr  Temperature Temperature (F) 102  Celsius 38.8  Temperature Source oral  Pulse Pulse 139  Respirations Respirations 18  Systolic BP Systolic BP 104  Diastolic BP (mmHg) Diastolic BP (mmHg) 68  Mean BP 80  Pulse Ox % Pulse Ox % 96  Pulse Ox Activity Level  At rest  Oxygen Delivery Room Air/ 21 %  *Intake and Output.:   23-Jan-16 00:46  Urine ml     Out:  325    04:03  Urine ml     Out:  475    Shift 07:00  Urine ml     Out:  800    Daily 07:00  Urine ml     Out:  2600    08:28  Urine ml     Out:  550    Shift 15:00  Urine ml     Out:  550   Brief Assessment:  GEN well developed, well nourished   Cardiac tachycardia, rhythm regular   Respiratory normal resp effort  clear BS  no use of accessory muscles   Gastrointestinal details normal Soft  Nontender  Nondistended  No masses palpable  Bowel sounds normal  No rebound tenderness  No gaurding   EXTR negative edema, no erythema   Additional Physical Exam GU: external genitalia edemetous with right labium minus greater than left.  Bilateral labiu majoria edematous.  This edema does not track and no inguinal lymphadenopathy noted.  multiple  vesicular lesions noted in various stages of developement (noted by RN to be new from yesterday and much worse).  No obvious lesions that are unroofed. Very tender to palpation.  mild erythema, foley catheter in place.  The vesicals are bilateral and include the surrounding vulva, labia majora and minora.   Lab Results: Hepatic:  21-Jan-16 17:19   Alkaline Phosphatase 86 (46-116 NOTE: New Reference Range 05/08/14)  SGPT (ALT) 26 (14-63 NOTE: New Reference Range 05/08/14)  SGOT (AST) 27  Routine Micro:  21-Jan-16 17:19   Micro Text Report URINE CULTURE   COMMENT                   MIXED BACTERIAL ORGANISMS   COMMENT                   RESULTS SUGGESTIVE OF CONTAMINATION   ANTIBIOTIC                       Specimen Source CLEAN [facility]  22-Jan-16 16:53   Micro Text Report CHLAM/N.GC RT-PCR ([facility])   CHLAMYDIA  CHLAMYDIA TRACHOMATIS NEGATIVE   N.GONORRHOEAE             N.GONORRHOEAE NEGATIVE   ANTIBIOTIC                       Specimen Source VAGINAL  Routine Chem:  21-Jan-16 17:19   Creatinine (comp) 1.11  22-Jan-16 04:56   Creatinine (comp) 0.95  Routine Sero:  21-Jan-16 17:19   Pregnancy Test, Urine NEGATIVE (The results of the qualitative urine HCG (Pregnancy Test) should be evaluated in light of other clinical information.  There are limitations to the test which, in certain clinical situations, may result in a false positive or negative result. Thehigh dose hook effect can occur in urine samples with extremely high HCG concentrations.  This effect can produce a negative result in certain situations. It is suggested that results of the qualitative HCG be confirmed by an alternate methodology, such as the quantitative serum beta HCG test.)  Routine Hem:  21-Jan-16 17:19   WBC (CBC)  12.4  Hemoglobin (CBC) 14.4  Hematocrit (CBC) 42.7  Platelet Count (CBC) 303  22-Jan-16 04:56   WBC (CBC) 8.5  Hemoglobin (CBC) 12.1  Hematocrit (CBC) 36.5  Platelet Count  (CBC) 212   Assessment/Plan:  Invasive Device Daily Assessment of Necessity:  Does the patient currently have any of the following indwelling devices? foley   Indwelling Urinary Catheter continued, requirement due to   Reason to continue Indwelling Urinary Catheter inability to void as documented when return of urine was 900 ml when placed   Assessment/Plan:  Assessment 1) Pyelonephritis with continued fever 2) urinary retention: likely secondary to vulvar/labial edema.  Likely to improve once able to reduce edema (See below) 3) multiple vesicles in various stages of progression - noted to be worse today by RN and per report from gynecologist seeing her yesterday during sign-out report.  These lesions are highly suggestive of a herpes simplex eruption.  The patient denies sexual intercourse in the past year and denies a history of a prior eruption of similar lesions.   Plan 1) pyelonephritis: per primary medicine team. 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  Also, will get serum HSV I-II IgG and IgM to get an idea of whether this is truly a primary outbreak or a longstanding infection.  Given severity of edema, is likely primary.  Recommend acyclovir  PO tid.  for edema, may give topical steroid of mild to medium strength.  For discomfort a topical anesthetic may also be given.  LFTs normal two days ago. But, will recheck as with primary HSV infection can be lead to end-organ dysfunction, including hepatitis.  If present, may require more aggressive therapy for this complicated version of the disease.   Discussed with general medicine attending of the day. Will continue to follow along and monitor.   Electronic Signatures: Conard Novak (MD)  (Signed 23-Jan-16 11:59)  Authored: Chief Complaint, VITAL SIGNS/ANCILLARY NOTES, Brief Assessment, Lab Results, Assessment/Plan   Last Updated: 23-Jan-16 11:59  by Conard Novak (MD)

## 2015-02-17 NOTE — Consult Note (Signed)
Chief Complaint:  Subjective/Chief Complaint patient feeling lethergic today and sore.  States the topical anesthetic used yesterday caused burning.  Has been afebrile since yesterday at around 11am.   VITAL SIGNS/ANCILLARY NOTES: **Vital Signs.:   24-Jan-16 01:29  Vital Signs Type Q 8hr  Celsius 37.6  Temperature Source oral  Pulse Pulse 122  Respirations Respirations 18  Systolic BP Systolic BP 105  Diastolic BP (mmHg) Diastolic BP (mmHg) 66  Mean BP 79  Pulse Ox % Pulse Ox % 93  Pulse Ox Activity Level  At rest  Oxygen Delivery Room Air/ 21 %    08:09  Vital Signs Type Q 8hr  Temperature Temperature (F) 99.7  Celsius 37.6  Temperature Source oral  Pulse Pulse 109  Respirations Respirations 18  Systolic BP Systolic BP 121  Diastolic BP (mmHg) Diastolic BP (mmHg) 74  Mean BP 89  Pulse Ox % Pulse Ox % 96  Pulse Ox Activity Level  At rest  Oxygen Delivery Room Air/ 21 %  *Intake and Output.:   24-Jan-16 01:22  Grand Totals Intake:  579 Output:  400    Net:  179 24 Hr.:  -1910    04:47  Grand Totals Intake:  393.01 Output:  600    Net:  -206.99 24 Hr.:  -2116.99    06:23  Grand Totals Intake:  0 Output:  200    Net:  -200 24 Hr.:  -2316.99    Shift 07:00  Grand Totals Intake:  972.01 Output:  1200    Net:  -227.99 24 Hr.:  -2316.99    Daily 07:00  Grand Totals Intake:  3033.01 Output:  5350    Net:  -2316.99 24 Hr.:  -2316.99    08:09  Grand Totals Intake:  0 Output:  250    Net:  -250 24 Hr.:  -250    08:58  Grand Totals Intake:  0 Output:      Net:  0 24 Hr.:  -250    09:10  Grand Totals Intake:  254 Output:      Net:  254 24 Hr.:  4    10:08  Grand Totals Intake:   Output:  325    Net:  -325 24 Hr.:  -321    12:45  Grand Totals Intake:   Output:  400    Net:  -400 24 Hr.:  -721    Shift 15:00  Grand Totals Intake:  254 Output:  975    Net:  -721 24 Hr.:  -721   Brief Assessment:  GEN well developed, well nourished   Cardiac  Regular Rate and Rhythm   Respiratory normal resp effort  clear BS  no use of accessory muscles   Gastrointestinal details normal Soft  Nontender  Nondistended  No masses palpable  Bowel sounds normal  No rebound tenderness  No gaurding   EXTR negative edema, no erythema   Additional Physical Exam GU: external genitalia edemetous with right labium minus greater than left.  Bilateral labiu majoria edematous.  This edema does not track and no inguinal lymphadenopathy noted.  multiple vesicular lesions noted in various stages of developement with increased crusting of lesions. None appeard unrood and ulcerated at this time. Very tender to palpation.  mild erythema decreased from yesterday, foley catheter in place.  The vesicals are bilateral and include the surrounding vulva, labia majora and minora.   Lab Results: Hepatic:  23-Jan-16 12:01   SGOT (AST)  42 (Result(s) reported on 10 Nov 2014 at 12:33PM.)  SGPT (ALT) 43 (14-63 NOTE: New Reference Range 05/08/14)  Alkaline Phosphatase 70 (46-116 NOTE: New Reference Range 05/08/14)   Radiology Results: CT:    23-Jan-16 14:33, CT Abdomen and Pelvis With Contrast  CT Abdomen and Pelvis With Contrast   REASON FOR EXAM:    (1) fevers, pyelonephritis; (2) fevers, vulvar edema  COMMENTS:       PROCEDURE: CT  - CT ABDOMEN / PELVIS  W  - Nov 10 2014  2:33PM     CLINICAL DATA:  Fevers and vulvar lesions    EXAM:  CT ABDOMEN AND PELVIS WITH CONTRAST    TECHNIQUE:  Multidetector CT imaging of the abdomen and pelvis was performed  using the standard protocol following bolus administration of  intravenous contrast.  CONTRAST:  100 mL Omnipaque 300.    COMPARISON:  None.    FINDINGS:  Lung bases demonstrate mild bibasilar atelectatic changes. Very  minimal pleural effusions are seen.    The liver, gallbladder, spleen, adrenal glands and pancreas are all  normal in their CT appearance. Kidneys are well visualized  bilaterally and reveal a  normal enhancement pattern. No obstructive  changes are seen.    Bladder is well distended and demonstrates some air within. This is  likely related to the recent instrumentation. The catheter is noted  within the bladder. No pelvic mass lesion or sidewall abnormality is  noted. No pelvic fluid is noted. The appendix is within normal  limits. No acute bony abnormality is noted.     IMPRESSION:  No acute abnormality noted.      Electronically Signed    By: Alcide CleverMark  Lukens M.D.    On: 11/10/2014 14:38         Verified By: Phillips OdorMARK L. LUKENS, M.D.,   Assessment/Plan:  Invasive Device Daily Assessment of Necessity:  Does the patient currently have any of the following indwelling devices? foley   Indwelling Urinary Catheter continued, requirement due to   Reason to continue Indwelling Urinary Catheter inability to void as documented when return of urine was 900 ml when placed   Assessment/Plan:  Assessment 1) Pyelonephritis: afebrile since yesterday.   2) urinary retention: likely secondary to vulvar/labial edema.  Likely to improve once able to reduce edema (See below) 3) multiple vesicles in various stages of progression - These lesions are highly suggestive of a herpes simplex eruption.  The patient denies sexual intercourse in the past year and denies a history of a prior eruption of similar lesions.   Plan 1) pyelonephritis: per primary medicine team. 2) urinary retention: continue indwelling catheter at this time 3) likely HSV: pending HSV swab for PCR speciation.  Also, pending is serum HSV I-II IgG and IgM to get an idea of whether this is truly a primary outbreak or a longstanding infection.  Given severity of edema, is likely primary.  Recommend acyclovir 400mg  PO tid.  for edema, may use ice packs.  For discomfort and edema will switch to epifoam.  LFTs slightly elevated yesterday. But, will recheck as with primary HSV infection can be lead to end-organ dysfunction, including  hepatitis.  If present, may require more aggressive therapy for this complicated version of the disease.   Will continue to follow along and monitor.   Electronic Signatures: Conard NovakJackson, Angelyse Heslin D (MD)  (Signed 24-Jan-16 13:47)  Authored: Chief Complaint, VITAL SIGNS/ANCILLARY NOTES, Brief Assessment, Lab Results, Radiology Results, Assessment/Plan   Last Updated: 24-Jan-16 13:47 by Conard NovakJackson, Haris Baack D (MD)

## 2015-11-24 ENCOUNTER — Emergency Department
Admission: EM | Admit: 2015-11-24 | Discharge: 2015-11-24 | Disposition: A | Discharge status: Home / Self Care | Payer: Medicaid Other | Attending: Emergency Medicine | Admitting: Emergency Medicine

## 2015-11-24 ENCOUNTER — Emergency Department: Payer: Medicaid Other

## 2015-11-24 DIAGNOSIS — Z79899 Other long term (current) drug therapy: Secondary | ICD-10-CM | POA: Diagnosis not present

## 2015-11-24 DIAGNOSIS — F410 Panic disorder [episodic paroxysmal anxiety] without agoraphobia: Secondary | ICD-10-CM | POA: Diagnosis not present

## 2015-11-24 DIAGNOSIS — Z3202 Encounter for pregnancy test, result negative: Secondary | ICD-10-CM | POA: Diagnosis not present

## 2015-11-24 DIAGNOSIS — R079 Chest pain, unspecified: Secondary | ICD-10-CM

## 2015-11-24 HISTORY — DX: Herpesviral infection of urogenital system, unspecified: A60.00

## 2015-11-24 LAB — BASIC METABOLIC PANEL
Anion gap: 7 (ref 5–15)
BUN: 10 mg/dL (ref 6–20)
CHLORIDE: 106 mmol/L (ref 101–111)
CO2: 25 mmol/L (ref 22–32)
Calcium: 9.5 mg/dL (ref 8.9–10.3)
Creatinine, Ser: 1.05 mg/dL — ABNORMAL HIGH (ref 0.44–1.00)
GFR calc Af Amer: >60 mL/min (ref >60)
GLUCOSE: 97 mg/dL (ref 65–99)
POTASSIUM: 3.5 mmol/L (ref 3.5–5.1)
Sodium: 138 mmol/L (ref 135–145)

## 2015-11-24 LAB — CBC
HEMATOCRIT: 42.1 % (ref 35.0–47.0)
Hemoglobin: 14.3 g/dL (ref 12.0–16.0)
MCH: 30 pg (ref 26.0–34.0)
MCHC: 34.1 g/dL (ref 32.0–36.0)
MCV: 88 fL (ref 80.0–100.0)
Platelets: 289 10*3/uL (ref 150–440)
RBC: 4.79 MIL/uL (ref 3.80–5.20)
RDW: 14 % (ref 11.5–14.5)
WBC: 7.9 10*3/uL (ref 3.6–11.0)

## 2015-11-24 LAB — URINALYSIS COMPLETE WITH MICROSCOPIC (ARMC ONLY)
BACTERIA UA: NONE SEEN
Bilirubin Urine: NEGATIVE
Glucose, UA: NEGATIVE mg/dL
Hgb urine dipstick: NEGATIVE
Ketones, ur: NEGATIVE mg/dL
Nitrite: NEGATIVE
PH: 8 (ref 5.0–8.0)
PROTEIN: NEGATIVE mg/dL
Specific Gravity, Urine: 1.013 (ref 1.005–1.030)

## 2015-11-24 LAB — PREGNANCY, URINE: Preg Test, Ur: NEGATIVE

## 2015-11-24 LAB — TROPONIN I: Troponin I: <0.03 ng/mL (ref <0.031)

## 2015-11-24 MED ORDER — DIAZEPAM 5 MG/ML IJ SOLN
5.0000 mg | Freq: Once | INTRAMUSCULAR | Status: AC
  Administered 2015-11-24: 5 mg via INTRAVENOUS
  Filled 2015-11-24: qty 2

## 2015-11-24 MED ORDER — ONDANSETRON 4 MG PO TBDP: 4.0000 mg | ORAL_TABLET | Freq: Three times a day (TID) | ORAL | Status: DC | PRN

## 2015-11-24 MED ORDER — ONDANSETRON 8 MG PO TBDP
8.0000 mg | ORAL_TABLET | Freq: Once | ORAL | Status: AC
  Administered 2015-11-24: 8 mg via ORAL
  Filled 2015-11-24: qty 1

## 2015-11-24 NOTE — ED Notes (Signed)
Pt came to ED c/o chest pain that started at work. Pt reports nothing triggered the chest pain. Pt reports a pressure/squeezing feeling in the center of her chest. Pt reports feeling numb and tingling. Pt has history of anxiety attacks.

## 2015-11-24 NOTE — Discharge Instructions (Signed)
°Panic Attacks °Panic attacks are sudden, short-lived surges of severe anxiety, fear, or discomfort. They may occur for no reason when you are relaxed, when you are anxious, or when you are sleeping. Panic attacks may occur for a number of reasons:  °· Healthy people occasionally have panic attacks in extreme, life-threatening situations, such as war or natural disasters. Normal anxiety is a protective mechanism of the body that helps us react to danger (fight or flight response). °· Panic attacks are often seen with anxiety disorders, such as panic disorder, social anxiety disorder, generalized anxiety disorder, and phobias. Anxiety disorders cause excessive or uncontrollable anxiety. They may interfere with your relationships or other life activities. °· Panic attacks are sometimes seen with other mental illnesses, such as depression and posttraumatic stress disorder. °· Certain medical conditions, prescription medicines, and drugs of abuse can cause panic attacks. °SYMPTOMS  °Panic attacks start suddenly, peak within 20 minutes, and are accompanied by four or more of the following symptoms: °· Pounding heart or fast heart rate (palpitations). °· Sweating. °· Trembling or shaking. °· Shortness of breath or feeling smothered. °· Feeling choked. °· Chest pain or discomfort. °· Nausea or strange feeling in your stomach. °· Dizziness, light-headedness, or feeling like you will faint. °· Chills or hot flushes. °· Numbness or tingling in your lips or hands and feet. °· Feeling that things are not real or feeling that you are not yourself. °· Fear of losing control or going crazy. °· Fear of dying. °Some of these symptoms can mimic serious medical conditions. For example, you may think you are having a heart attack. Although panic attacks can be very scary, they are not life threatening. °DIAGNOSIS  °Panic attacks are diagnosed through an assessment by your health care provider. Your health care provider will ask  questions about your symptoms, such as where and when they occurred. Your health care provider will also ask about your medical history and use of alcohol and drugs, including prescription medicines. Your health care provider may order blood tests or other studies to rule out a serious medical condition. Your health care provider may refer you to a mental health professional for further evaluation. °TREATMENT  °· Most healthy people who have one or two panic attacks in an extreme, life-threatening situation will not require treatment. °· The treatment for panic attacks associated with anxiety disorders or other mental illness typically involves counseling with a mental health professional, medicine, or a combination of both. Your health care provider will help determine what treatment is best for you. °· Panic attacks due to physical illness usually go away with treatment of the illness. If prescription medicine is causing panic attacks, talk with your health care provider about stopping the medicine, decreasing the dose, or substituting another medicine. °· Panic attacks due to alcohol or drug abuse go away with abstinence. Some adults need professional help in order to stop drinking or using drugs. °HOME CARE INSTRUCTIONS  °· Take all medicines as directed by your health care provider.   °· Schedule and attend follow-up visits as directed by your health care provider. It is important to keep all your appointments. °SEEK MEDICAL CARE IF: °· You are not able to take your medicines as prescribed. °· Your symptoms do not improve or get worse. °SEEK IMMEDIATE MEDICAL CARE IF:  °· You experience panic attack symptoms that are different than your usual symptoms. °· You have serious thoughts about hurting yourself or others. °· You are taking medicine for panic attacks and   have a serious side effect. °MAKE SURE YOU: °· Understand these instructions. °· Will watch your condition. °· Will get help right away if you are not  doing well or get worse. °  °This information is not intended to replace advice given to you by your health care provider. Make sure you discuss any questions you have with your health care provider. °  °Document Released: 10/05/2005 Document Revised: 10/10/2013 Document Reviewed: 05/19/2013 °Elsevier Interactive Patient Education ©2016 [employer] °Nonspecific Chest Pain  °Chest pain can be caused by many different conditions. There is always a chance that your pain could be related to something serious, such as a heart attack or a blood clot in your lungs. Chest pain can also be caused by conditions that are not life-threatening. If you have chest pain, it is very important to follow up with your health care provider. °CAUSES  °Chest pain can be caused by: °· Heartburn. °· Pneumonia or bronchitis. °· Anxiety or stress. °· Inflammation around your heart (pericarditis) or lung (pleuritis or pleurisy). °· A blood clot in your lung. °· A collapsed lung (pneumothorax). It can develop suddenly on its own (spontaneous pneumothorax) or from trauma to the chest. °· Shingles infection (varicella-zoster virus). °· Heart attack. °· Damage to the bones, muscles, and cartilage that make up your chest wall. This can include: °· Bruised bones due to injury. °· Strained muscles or cartilage due to frequent or repeated coughing or overwork. °· Fracture to one or more ribs. °· Sore cartilage due to inflammation (costochondritis). °RISK FACTORS  °Risk factors for chest pain may include: °· Activities that increase your risk for trauma or injury to your chest. °· Respiratory infections or conditions that cause frequent coughing. °· Medical conditions or overeating that can cause heartburn. °· Heart disease or family history of heart disease. °· Conditions or health behaviors that increase your risk of developing a blood clot. °· Having had chicken pox (varicella zoster). °SIGNS AND SYMPTOMS °Chest pain can feel like: °· Burning or  tingling on the surface of your chest or deep in your chest. °· Crushing, pressure, aching, or squeezing pain. °· Dull or sharp pain that is worse when you move, cough, or take a deep breath. °· Pain that is also felt in your back, neck, shoulder, or arm, or pain that spreads to any of these areas. °Your chest pain may come and go, or it may stay constant. °DIAGNOSIS °Lab tests or other studies may be needed to find the cause of your pain. Your health care provider may have you take a test called an ambulatory ECG (electrocardiogram). An ECG records your heartbeat patterns at the time the test is performed. You may also have other tests, such as: °· Transthoracic echocardiogram (TTE). During echocardiography, sound waves are used to create a picture of all of the heart structures and to look at how blood flows through your heart. °· Transesophageal echocardiogram (TEE). This is a more advanced imaging test that obtains images from inside your body. It allows your health care provider to see your heart in finer detail. °· Cardiac monitoring. This allows your health care provider to monitor your heart rate and rhythm in real time. °· Holter monitor. This is a portable device that records your heartbeat and can help to diagnose abnormal heartbeats. It allows your health care provider to track your heart activity for several days, if needed. °· Stress tests. These can be done through exercise or by taking medicine that makes your heart   beat more quickly. °· Blood tests. °· Imaging tests. °TREATMENT  °Your treatment depends on what is causing your chest pain. Treatment may include: °· Medicines. These may include: °· Acid blockers for heartburn. °· Anti-inflammatory medicine. °· Pain medicine for inflammatory conditions. °· Antibiotic medicine, if an infection is present. °· Medicines to dissolve blood clots. °· Medicines to treat coronary artery disease. °· Supportive care for conditions that do not require medicines.  This may include: °· Resting. °· Applying heat or cold packs to injured areas. °· Limiting activities until pain decreases. °HOME CARE INSTRUCTIONS °· If you were prescribed an antibiotic medicine, finish it all even if you start to feel better. °· Avoid any activities that bring on chest pain. °· Do not use any tobacco products, including cigarettes, chewing tobacco, or electronic cigarettes. If you need help quitting, ask your health care provider. °· Do not drink alcohol. °· Take medicines only as directed by your health care provider. °· Keep all follow-up visits as directed by your health care provider. This is important. This includes any further testing if your chest pain does not go away. °· If heartburn is the cause for your chest pain, you may be told to keep your head raised (elevated) while sleeping. This reduces the chance that acid will go from your stomach into your esophagus. °· Make lifestyle changes as directed by your health care provider. These may include: °· Getting regular exercise. Ask your health care provider to suggest some activities that are safe for you. °· Eating a heart-healthy diet. A registered dietitian can help you to learn healthy eating options. °· Maintaining a healthy weight. °· Managing diabetes, if necessary. °· Reducing stress. °SEEK MEDICAL CARE IF: °· Your chest pain does not go away after treatment. °· You have a rash with blisters on your chest. °· You have a fever. °SEEK IMMEDIATE MEDICAL CARE IF:  °· Your chest pain is worse. °· You have an increasing cough, or you cough up blood. °· You have severe abdominal pain. °· You have severe weakness. °· You faint. °· You have chills. °· You have sudden, unexplained chest discomfort. °· You have sudden, unexplained discomfort in your arms, back, neck, or jaw. °· You have shortness of breath at any time. °· You suddenly start to sweat, or your skin gets clammy. °· You feel nauseous or you vomit. °· You suddenly feel  light-headed or dizzy. °· Your heart begins to beat quickly, or it feels like it is skipping beats. °These symptoms may represent a serious problem that is an emergency. Do not wait to see if the symptoms will go away. Get medical help right away. Call your local emergency services (911 in the [place] Do not drive yourself to the hospital. °  °This information is not intended to replace advice given to you by your health care provider. Make sure you discuss any questions you have with your health care provider. °  °Document Released: 07/15/2005 Document Revised: 10/26/2014 Document Reviewed: 05/11/2014 °Elsevier Interactive Patient Education ©2016 Elsevier Inc. ° °

## 2015-11-24 NOTE — ED Provider Notes (Signed)
[facility] Emergency Department Provider Note  ____________________________________________  Time seen: 12:05 PM  I have reviewed the triage vital signs and the nursing notes.   HISTORY  Chief Complaint Chest Pain    HPI [name] is a 25 y.o. female who complains of chest pain that started earlier today about 3 hours ago. It's been waxing and waning but constant since then. Since described as pressure and squeezing in the center of her chest, nonradiating, no associated shortness of breath diaphoresis or vomiting. She does report feeling paresthesias in her bilateral hands face and her entire body. Denies syncope but was having some dizziness. Pain is not exertional, not pleuritic. Has a history of similar symptoms from anxiety and panic attacks. Was sent to the ED by her coworkers due to dizziness.    Past Medical History  Diagnosis Date  . Genital herpes      Patient Active Problem List   Diagnosis Date Noted  . SOB (shortness of breath) 05/30/2014  . Other chest pain 05/30/2014  . Rapid heart beat 05/30/2014  . Panic attacks 05/30/2014  . Anxiety 05/30/2014     History reviewed. No pertinent past surgical history.   Current Outpatient Rx  Name  Route  Sig  Dispense  Refill  . DULoxetine (CYMBALTA) 20 MG capsule   Oral   Take 20 mg by mouth at bedtime.         . norgestimate-ethinyl estradiol (SPRINTEC 28) 0.25-35 MG-MCG tablet   Oral   Take 1 tablet by mouth at bedtime.         Marland Kitchen QUEtiapine (SEROQUEL XR) 400 MG 24 hr tablet   Oral   Take 400 mg by mouth at bedtime.            Allergies Review of patient's allergies indicates no known allergies.   Family History  Problem Relation Age of Onset  . Hyperlipidemia Mother   . Hypertension Mother   . Hyperlipidemia Father     Social History Social History  Substance Use Topics  . Smoking status: Never Smoker   . Smokeless tobacco: None  . Alcohol Use: Yes   Comment: Rare.     Review of Systems  Constitutional:   No fever or chills. No weight changes Eyes:   No blurry vision or double vision.  ENT:   No sore throat. Cardiovascular:   Positive as above chest pain. Respiratory:   No dyspnea or cough. Gastrointestinal:   Negative for abdominal pain, vomiting and diarrhea.  No BRBPR or melena. Genitourinary:   Negative for dysuria, urinary retention, bloody urine, or difficulty urinating. Musculoskeletal:   Negative for back pain. No joint swelling or pain. Skin:   Negative for rash. Neurological:   Negative for headaches, focal weakness or numbness. Psychiatric:  Positive anxiety.   Endocrine:  No hot/cold intolerance, changes in energy, or sleep difficulty.  10-point ROS otherwise negative.  ____________________________________________   PHYSICAL EXAM:  VITAL SIGNS: ED Triage Vitals  Enc Vitals Group     BP 11/24/15 1204 132/85 mmHg     Pulse Rate 11/24/15 1204 107     Resp 11/24/15 1204 20     Temp 11/24/15 1204 97.8 F (36.6 C)     Temp Source 11/24/15 1204 Oral     SpO2 11/24/15 1204 100 %     Weight 11/24/15 1204 190 lb (86.183 kg)     Height 11/24/15 1204  (1.6 m)     Head Cir --  Peak Flow --      Pain Score 11/24/15 1205 8     Pain Loc --      Pain Edu? --      Excl. in GC? --     Vital signs reviewed, nursing assessments reviewed.   Constitutional:   Alert and oriented. Well appearing and in no distress. Eyes:   No scleral icterus. No conjunctival pallor. PERRL. EOMI ENT   Head:   Normocephalic and atraumatic.   Nose:   No congestion/rhinnorhea. No septal hematoma   Mouth/Throat:   MMM, no pharyngeal erythema. No peritonsillar mass. No uvula shift.   Neck:   No stridor. No SubQ emphysema. No meningismus. Hematological/Lymphatic/Immunilogical:   No cervical lymphadenopathy. Cardiovascular:   RRR. Normal and symmetric distal pulses are present in all extremities. No murmurs, rubs, or  gallops. Respiratory:   Normal respiratory effort without tachypnea nor retractions. Breath sounds are clear and equal bilaterally. No wheezes/rales/rhonchi. Gastrointestinal:   Soft and nontender. No distention. There is no CVA tenderness.  No rebound, rigidity, or guarding. Genitourinary:   deferred Musculoskeletal:   Nontender with normal range of motion in all extremities. No joint effusions.  No lower extremity tenderness.  No edema. Neurologic:   Normal speech and language.  CN 2-10 normal. Motor grossly intact. No pronator drift.  Normal gait. No gross focal neurologic deficits are appreciated.  Skin:    Skin is warm, dry and intact. No rash noted.  No petechiae, purpura, or bullae. Psychiatric:   Mood and affect are normal. Speech and behavior are normal. Patient exhibits appropriate insight and judgment.  ____________________________________________    LABS (pertinent positives/negatives) (all labs ordered are listed, but only abnormal results are displayed) Labs Reviewed  BASIC METABOLIC PANEL - Abnormal; Notable for the following:    Creatinine, Ser 1.05 (*)    All other components within normal limits  CBC  TROPONIN I  URINALYSIS COMPLETEWITH MICROSCOPIC ([facility] ONLY)  PREGNANCY, URINE   ____________________________________________   EKG  Interpreted by me  Date: 11/24/2015  Rate: 97  Rhythm: normal sinus rhythm  QRS Axis: normal  Intervals: normal  ST/T Wave abnormalities: normal  Conduction Disutrbances: none  Narrative Interpretation: unremarkable      ____________________________________________    RADIOLOGY  Chest x-ray unremarkable  ____________________________________________   PROCEDURES   ____________________________________________   INITIAL IMPRESSION / ASSESSMENT AND PLAN / ED COURSE  Pertinent labs & imaging results that were available during my care of the patient were reviewed by me and considered in my medical decision making  (see chart for details).  Patient presents with nonspecific chest pain. History and physical exam are both reassuring.Considering the patient's symptoms, medical history, and physical examination today, I have low suspicion for ACS, PE, TAD, pneumothorax, carditis, mediastinitis, pneumonia, CHF, or sepsis. Heart score is low. Patient does appear to be anxious and we'll give her a dose of Ativan while checking labs.       ____________________________________________   FINAL CLINICAL IMPRESSION(S) / ED DIAGNOSES  Final diagnoses:  Panic attacks  Nonspecific chest pain      Sharman Cheek, MD 11/24/15 [phone]

## 2015-11-30 ENCOUNTER — Emergency Department
Admission: EM | Admit: 2015-11-30 | Discharge: 2015-12-01 | Disposition: A | Discharge status: Home / Self Care | Payer: Medicaid Other | Attending: Emergency Medicine | Admitting: Emergency Medicine

## 2015-11-30 ENCOUNTER — Emergency Department: Payer: Medicaid Other

## 2015-11-30 ENCOUNTER — Encounter: Payer: Self-pay | Admitting: Emergency Medicine

## 2015-11-30 DIAGNOSIS — Z793 Long term (current) use of hormonal contraceptives: Secondary | ICD-10-CM | POA: Diagnosis not present

## 2015-11-30 DIAGNOSIS — Y9389 Activity, other specified: Secondary | ICD-10-CM | POA: Insufficient documentation

## 2015-11-30 DIAGNOSIS — Y92512 Supermarket, store or market as the place of occurrence of the external cause: Secondary | ICD-10-CM | POA: Insufficient documentation

## 2015-11-30 DIAGNOSIS — W208XXA Other cause of strike by thrown, projected or falling object, initial encounter: Secondary | ICD-10-CM | POA: Diagnosis not present

## 2015-11-30 DIAGNOSIS — Z79899 Other long term (current) drug therapy: Secondary | ICD-10-CM | POA: Diagnosis not present

## 2015-11-30 DIAGNOSIS — S9031XA Contusion of right foot, initial encounter: Secondary | ICD-10-CM | POA: Diagnosis not present

## 2015-11-30 DIAGNOSIS — S99921A Unspecified injury of right foot, initial encounter: Secondary | ICD-10-CM | POA: Diagnosis present

## 2015-11-30 DIAGNOSIS — Y990 Civilian activity done for income or pay: Secondary | ICD-10-CM | POA: Diagnosis not present

## 2015-11-30 MED ORDER — NAPROXEN 500 MG PO TABS: 500.0000 mg | ORAL_TABLET | Freq: Two times a day (BID) | ORAL | Status: DC

## 2015-11-30 NOTE — ED Provider Notes (Signed)
[facility] Emergency Department Provider Note  ____________________________________________  Time seen: Approximately 10:57 PM  I have reviewed the triage vital signs and the nursing notes.   HISTORY  Chief Complaint Foot Pain    HPI [name] is a 25 y.o. female who presents emergency department complaining of right foot pain. Patient states that she was at work yesterday when she dropped a box on her foot. She didn't think anything of it at that time but states that this evening when she was taking a shower she saw the bruise and an foot began to hurt worse. She denies any nose or tingling into the distal portion of her foot. She states the pain is mid foot. There is a bruise there. No swelling.   Past Medical History  Diagnosis Date  . Genital herpes     Patient Active Problem List   Diagnosis Date Noted  . SOB (shortness of breath) 05/30/2014  . Other chest pain 05/30/2014  . Rapid heart beat 05/30/2014  . Panic attacks 05/30/2014  . Anxiety 05/30/2014    History reviewed. No pertinent past surgical history.  Current Outpatient Rx  Name  Route  Sig  Dispense  Refill  . DULoxetine (CYMBALTA) 20 MG capsule   Oral   Take 30 mg by mouth daily.          . naproxen (NAPROSYN) 500 MG tablet   Oral   Take 1 tablet (500 mg total) by mouth 2 (two) times daily with a meal.   60 tablet   0   . norgestimate-ethinyl estradiol (SPRINTEC 28) 0.25-35 MG-MCG tablet   Oral   Take 1 tablet by mouth at bedtime.         . ondansetron (ZOFRAN ODT) 4 MG disintegrating tablet   Oral   Take 1 tablet (4 mg total) by mouth every 8 (eight) hours as needed for nausea or vomiting.   20 tablet   0   . QUEtiapine (SEROQUEL XR) 400 MG 24 hr tablet   Oral   Take 400 mg by mouth at bedtime.           Allergies Review of patient's allergies indicates no known allergies.  Family History  Problem Relation Age of Onset  . Hyperlipidemia Mother   .  Hypertension Mother   . Hyperlipidemia Father     Social History Social History  Substance Use Topics  . Smoking status: Never Smoker   . Smokeless tobacco: None  . Alcohol Use: Yes     Comment: Rare.      Review of Systems  Constitutional: No fever/chills Musculoskeletal: Negative for back pain. Positive for right foot pain and bruise to the dorsal aspect. Skin: Negative for rash. Neurological: Negative for headaches, focal weakness or numbness. 10-point ROS otherwise negative.  ____________________________________________   PHYSICAL EXAM:  VITAL SIGNS: ED Triage Vitals  Enc Vitals Group     BP 11/30/15 2208 136/93 mmHg     Pulse Rate 11/30/15 2208 102     Resp 11/30/15 2208 18     Temp 11/30/15 2208 98.5 F (36.9 C)     Temp Source 11/30/15 2208 Oral     SpO2 11/30/15 2208 98 %     Weight 11/30/15 2208 194 lb (87.998 kg)     Height 11/30/15 2208  (1.6 m)     Head Cir --      Peak Flow --      Pain Score 11/30/15 2209 6  Pain Loc --      Pain Edu? --      Excl. in GC? --      Constitutional: Alert and oriented. Well appearing and in no acute distress. Musculoskeletal: Bruising noted to the dorsal aspect of the right foot. No edema or deformity noted. Full range of motion to ankle and all digits. Patient is diffusely tender to palpation mid foot on the medial aspect. No palpable abnormality. Capillary refill intact 5 digits. Sensation intact 5 digits. Neurologic:  Normal speech and language. No gross focal neurologic deficits are appreciated.  Skin:  Skin is warm, dry and intact. No rash noted. Psychiatric: Mood and affect are normal. Speech and behavior are normal. Patient exhibits appropriate insight and judgement.   ____________________________________________   LABS (all labs ordered are listed, but only abnormal results are displayed)  Labs Reviewed - No data to  display ____________________________________________  EKG   ____________________________________________  RADIOLOGY Festus Barren Cuthriell, personally viewed and evaluated these images (plain radiographs) as part of my medical decision making, as well as reviewing the written report by the radiologist.  Dg Foot Complete Right  11/30/2015  CLINICAL DATA:  Dropped box on right foot, with pain and bruising about the first metatarsal. Initial encounter. EXAM: RIGHT FOOT COMPLETE - 3+ VIEW COMPARISON:  Right ankle radiographs performed 08/01/2013 FINDINGS: There is no evidence of fracture or dislocation. The joint spaces are preserved. There is no evidence of talar subluxation; the subtalar joint is unremarkable in appearance. No significant soft tissue abnormalities are seen. IMPRESSION: No evidence of fracture or dislocation. Electronically Signed   By: Roanna Raider M.D.   On: 11/30/2015 22:30    ____________________________________________    PROCEDURES  Procedure(s) performed:       Medications - No data to display   ____________________________________________   INITIAL IMPRESSION / ASSESSMENT AND PLAN / ED COURSE  Pertinent labs & imaging results that were available during my care of the patient were reviewed by me and considered in my medical decision making (see chart for details).  Patient's diagnosis is consistent with right foot contusion. Patient will be discharged home with prescriptions for anti-inflammatories. Patient is to follow up with her Medicare provider if symptoms persist past this treatment course. Patient is given ED precautions to return to the ED for any worsening or new symptoms.     ____________________________________________  FINAL CLINICAL IMPRESSION(S) / ED DIAGNOSES  Final diagnoses:  Foot contusion, right, initial encounter      NEW MEDICATIONS STARTED DURING THIS VISIT:  New Prescriptions   NAPROXEN (NAPROSYN) 500 MG TABLET     Take 1 tablet (500 mg total) by mouth 2 (two) times daily with a meal.        Racheal Patches, PA-C 11/30/15 2309  Governor Rooks, MD 12/01/15 0000

## 2015-11-30 NOTE — ED Notes (Signed)
Pt says she dropped a box on her foot at work yesterday at [employer]; pain to the top of her right foot radiating up to top of right great toe; pt says she was able to work today; didn't notice the pain until she was in the shower tonight and saw the bruise;

## 2015-11-30 NOTE — Discharge Instructions (Signed)

## 2015-12-01 NOTE — ED Notes (Signed)
Pt verbalizes understanding of discharged instructions

## 2016-01-23 ENCOUNTER — Emergency Department
Admission: EM | Admit: 2016-01-23 | Discharge: 2016-01-23 | Disposition: A | Discharge status: Home / Self Care | Payer: Medicaid Other | Attending: Emergency Medicine | Admitting: Emergency Medicine

## 2016-01-23 ENCOUNTER — Emergency Department: Payer: Medicaid Other

## 2016-01-23 ENCOUNTER — Encounter: Payer: Self-pay | Admitting: Urgent Care

## 2016-01-23 DIAGNOSIS — R000 Tachycardia, unspecified: Secondary | ICD-10-CM | POA: Diagnosis not present

## 2016-01-23 DIAGNOSIS — M25511 Pain in right shoulder: Secondary | ICD-10-CM | POA: Insufficient documentation

## 2016-01-23 DIAGNOSIS — Z79899 Other long term (current) drug therapy: Secondary | ICD-10-CM | POA: Insufficient documentation

## 2016-01-23 DIAGNOSIS — F3131 Bipolar disorder, current episode depressed, mild: Secondary | ICD-10-CM | POA: Diagnosis not present

## 2016-01-23 HISTORY — DX: Major depressive disorder, single episode, unspecified: F32.9

## 2016-01-23 HISTORY — DX: Bipolar disorder, unspecified: F31.9

## 2016-01-23 HISTORY — DX: Depression, unspecified: F32.A

## 2016-01-23 MED ORDER — CYCLOBENZAPRINE HCL 10 MG PO TABS: 10.0000 mg | ORAL_TABLET | Freq: Three times a day (TID) | ORAL | Status: DC | PRN

## 2016-01-23 MED ORDER — CYCLOBENZAPRINE HCL 10 MG PO TABS
10.0000 mg | ORAL_TABLET | Freq: Once | ORAL | Status: AC
  Administered 2016-01-23: 10 mg via ORAL
  Filled 2016-01-23: qty 1

## 2016-01-23 NOTE — ED Notes (Addendum)
Pt states R shoulder pain- states constant pain in muscle, pain in shoulder, pain in arm with movement-, states pain x1 month. Pt states pain increased after going to gym last night. Pt states she lifts heavy objects at work. States muscle on shoulder blade "burns" "feels like its on fire". Denies falling on shoulder. Pt states she has taken ibuprofen and tylenol.

## 2016-01-23 NOTE — ED Notes (Signed)
Patient presents with c/o RIGHT shoulder pain x 1 month. Limited ROM, per her report, secondary to pain. Patient denies injury; states, "it feels like bone is grinding".

## 2016-01-23 NOTE — ED Provider Notes (Signed)
[facility] Emergency Department Provider Note  ____________________________________________  Time seen: 5:00 AM  I have reviewed the triage vital signs and the nursing notes.   HISTORY  Chief Complaint Shoulder Pain      HPI RAFEEF Ward is a 25 y.o. female presents with nontraumatic right shoulder pain times one month with acute worsening yesterday after going to the gym. Patient states that the pain is worse with movement current pain score 7 out of 10. Patient states that she used machine hour her upper body at the gym last night with worsening of the pain following that.     Past Medical History  Diagnosis Date  . Genital herpes   . Depression   . Bipolar disorder [facility])     Patient Active Problem List   Diagnosis Date Noted  . SOB (shortness of breath) 05/30/2014  . Other chest pain 05/30/2014  . Rapid heart beat 05/30/2014  . Panic attacks 05/30/2014  . Anxiety 05/30/2014    History reviewed. No pertinent past surgical history.  Current Outpatient Rx  Name  Route  Sig  Dispense  Refill  . cyclobenzaprine (FLEXERIL) 10 MG tablet   Oral   Take 1 tablet (10 mg total) by mouth 3 (three) times daily as needed for muscle spasms.   30 tablet   0   . DULoxetine (CYMBALTA) 20 MG capsule   Oral   Take 30 mg by mouth daily.          . naproxen (NAPROSYN) 500 MG tablet   Oral   Take 1 tablet (500 mg total) by mouth 2 (two) times daily with a meal.   60 tablet   0   . norgestimate-ethinyl estradiol (SPRINTEC 28) 0.25-35 MG-MCG tablet   Oral   Take 1 tablet by mouth at bedtime.         . ondansetron (ZOFRAN ODT) 4 MG disintegrating tablet   Oral   Take 1 tablet (4 mg total) by mouth every 8 (eight) hours as needed for nausea or vomiting.   20 tablet   0   . QUEtiapine (SEROQUEL XR) 400 MG 24 hr tablet   Oral   Take 400 mg by mouth at bedtime.           Allergies Nickel  Family History  Problem Relation Age of Onset   . Hyperlipidemia Mother   . Hypertension Mother   . Hyperlipidemia Father     Social History Social History  Substance Use Topics  . Smoking status: Never Smoker   . Smokeless tobacco: None  . Alcohol Use: Yes     Comment: Rare.     Review of Systems  Constitutional: Negative for fever. Eyes: Negative for visual changes. ENT: Negative for sore throat. Cardiovascular: Negative for chest pain. Respiratory: Negative for shortness of breath. Gastrointestinal: Negative for abdominal pain, vomiting and diarrhea. Genitourinary: Negative for dysuria. Musculoskeletal: Negative for back pain.Positive for right shoulder pain Skin: Negative for rash. Neurological: Negative for headaches, focal weakness or numbness.  10-point ROS otherwise negative.  ____________________________________________   PHYSICAL EXAM:  VITAL SIGNS: ED Triage Vitals  Enc Vitals Group     BP 01/23/16 0347 137/84 mmHg     Pulse Rate 01/23/16 0347 110     Resp 01/23/16 0347 18     Temp 01/23/16 0347 98.1 F (36.7 C)     Temp Source 01/23/16 0347 Oral     SpO2 01/23/16 0347 100 %     Weight  01/23/16 0347 197 lb (89.359 kg)     Height 01/23/16 0347 5\' 3"  (1.6 m)     Head Cir --      Peak Flow --      Pain Score 01/23/16 0348 9     Pain Loc --      Pain Edu? --      Excl. in GC? --      Constitutional: Alert and oriented. Well appearing and in no distress. Eyes: Conjunctivae are normal. PERRL. Normal extraocular movements. ENT   Head: Normocephalic and atraumatic.   Nose: No congestion/rhinnorhea.   Mouth/Throat: Mucous membranes are moist.   Neck: No stridor. Hematological/Lymphatic/Immunilogical: No cervical lymphadenopathy. Cardiovascular: Normal rate, regular rhythm. Normal and symmetric distal pulses are present in all extremities. No murmurs, rubs, or gallops. Respiratory: Normal respiratory effort without tachypnea nor retractions. Breath sounds are clear and equal  bilaterally. No wheezes/rales/rhonchi. Gastrointestinal: Soft and nontender. No distention. There is no CVA tenderness. Genitourinary: deferred Musculoskeletal: Nontender with normal range of motion in all extremities. No joint effusions.  No lower extremity tenderness nor edema. Right shoulder pain with passive and active range of motion. Pain with palpation supraspinatus muscle and tendon. Pain with palpation of the rhomboid muscles on the right Neurologic:  Normal speech and language. No gross focal neurologic deficits are appreciated. Speech is normal.  Skin:  Skin is warm, dry and intact. No rash noted. Psychiatric: Mood and affect are normal. Speech and behavior are normal. Patient exhibits appropriate insight and judgment.      RADIOLOGY     DG Shoulder Right (Final result) Result time: 01/23/16 04:15:11   Final result by Rad Results In Interface (01/23/16 04:15:11)   Narrative:   CLINICAL DATA: Initial evaluation for acute right shoulder pain for 1 month, limited range of motion. No injury.  EXAM: RIGHT SHOULDER - 2+ VIEW  COMPARISON: None.  FINDINGS: No acute fracture dislocation. Humeral head in normal alignment with the glenoid. AC joint approximated. No radiographic evidence for degenerative or erosive arthropathy. No periarticular calcification. Osseous mineralization normal. Partially visualized right hemi thorax is clear.  IMPRESSION: Normal radiograph of the right shoulder.   Electronically Signed By: Rise MuBenjamin McClintock M.D. On: 01/23/2016 04:15         ____________________________________________   INITIAL IMPRESSION / ASSESSMENT AND PLAN / ED COURSE  Pertinent labs & imaging results that were available during my care of the patient were reviewed by me and considered in my medical decision making (see chart for details).  Patient received Flexeril 10 mg tablet. Suspect possible bursitis versus rotator cuff  injury  ____________________________________________   FINAL CLINICAL IMPRESSION(S) / ED DIAGNOSES  Final diagnoses:  Shoulder pain, acute, right      Darci Currentandolph N Ingram Onnen, MD 01/23/16 [phone]

## 2016-01-23 NOTE — Discharge Instructions (Signed)

## 2016-02-19 ENCOUNTER — Emergency Department
Admission: EM | Admit: 2016-02-19 | Discharge: 2016-02-19 | Disposition: A | Discharge status: Home / Self Care | Payer: No Typology Code available for payment source | Attending: Emergency Medicine | Admitting: Emergency Medicine

## 2016-02-19 DIAGNOSIS — S4991XA Unspecified injury of right shoulder and upper arm, initial encounter: Secondary | ICD-10-CM | POA: Diagnosis present

## 2016-02-19 DIAGNOSIS — S46811A Strain of other muscles, fascia and tendons at shoulder and upper arm level, right arm, initial encounter: Secondary | ICD-10-CM | POA: Diagnosis not present

## 2016-02-19 DIAGNOSIS — S46911A Strain of unspecified muscle, fascia and tendon at shoulder and upper arm level, right arm, initial encounter: Secondary | ICD-10-CM

## 2016-02-19 DIAGNOSIS — F319 Bipolar disorder, unspecified: Secondary | ICD-10-CM | POA: Insufficient documentation

## 2016-02-19 DIAGNOSIS — Y92410 Unspecified street and highway as the place of occurrence of the external cause: Secondary | ICD-10-CM | POA: Diagnosis not present

## 2016-02-19 DIAGNOSIS — Y999 Unspecified external cause status: Secondary | ICD-10-CM | POA: Insufficient documentation

## 2016-02-19 DIAGNOSIS — Y9389 Activity, other specified: Secondary | ICD-10-CM | POA: Diagnosis not present

## 2016-02-19 MED ORDER — MELOXICAM 15 MG PO TABS: 15.0000 mg | ORAL_TABLET | Freq: Every day | ORAL | Status: DC

## 2016-02-19 MED ORDER — CYCLOBENZAPRINE HCL 10 MG PO TABS: 10.0000 mg | ORAL_TABLET | Freq: Three times a day (TID) | ORAL | Status: DC | PRN

## 2016-02-19 NOTE — Discharge Instructions (Signed)
Muscle Strain °A muscle strain is an injury that occurs when a muscle is stretched beyond its normal length. Usually a small number of muscle fibers are torn when this happens. Muscle strain is rated in degrees. First-degree strains have the least amount of muscle fiber tearing and pain. Second-degree and third-degree strains have increasingly more tearing and pain.  °Usually, recovery from muscle strain takes 1-2 weeks. Complete healing takes 5-6 weeks.  °CAUSES  °Muscle strain happens when a sudden, violent force placed on a muscle stretches it too far. This may occur with lifting, sports, or a fall.  °RISK FACTORS °Muscle strain is especially common in athletes.  °SIGNS AND SYMPTOMS °At the site of the muscle strain, there may be: °· Pain. °· Bruising. °· Swelling. °· Difficulty using the muscle due to pain or lack of normal function. °DIAGNOSIS  °Your health care provider will perform a physical exam and ask about your medical history. °TREATMENT  °Often, the best treatment for a muscle strain is resting, icing, and applying cold compresses to the injured area.   °HOME CARE INSTRUCTIONS  °· Use the PRICE method of treatment to promote muscle healing during the first 2-3 days after your injury. The PRICE method involves: °· Protecting the muscle from being injured again. °· Restricting your activity and resting the injured body part. °· Icing your injury. To do this, put ice in a plastic bag. Place a towel between your skin and the bag. Then, apply the ice and leave it on from 15-20 minutes each hour. After the third day, switch to moist heat packs. °· Apply compression to the injured area with a splint or elastic bandage. Be careful not to wrap it too tightly. This may interfere with blood circulation or increase swelling. °· Elevate the injured body part above the level of your heart as often as you can. °· Only take over-the-counter or prescription medicines for pain, discomfort, or fever as directed by your  health care provider. °· Warming up prior to exercise helps to prevent future muscle strains. °SEEK MEDICAL CARE IF:  °· You have increasing pain or swelling in the injured area. °· You have numbness, tingling, or a significant loss of strength in the injured area. °MAKE SURE YOU:  °· Understand these instructions. °· Will watch your condition. °· Will get help right away if you are not doing well or get worse. °  °This information is not intended to replace advice given to you by your health care provider. Make sure you discuss any questions you have with your health care provider. °  °Document Released: 10/05/2005 Document Revised: 07/26/2013 Document Reviewed: 05/04/2013 °Elsevier Interactive Patient Education ©2016 [employer] ° °Cryotherapy °Cryotherapy means treatment with cold. Ice or gel packs can be used to reduce both pain and swelling. Ice is the most helpful within the first 24 to 48 hours after an injury or flare-up from overusing a muscle or joint. Sprains, strains, spasms, burning pain, shooting pain, and aches can all be eased with ice. Ice can also be used when recovering from surgery. Ice is effective, has very few side effects, and is safe for most people to use. °PRECAUTIONS  °Ice is not a safe treatment option for people with: °· Raynaud phenomenon. This is a condition affecting small blood vessels in the extremities. Exposure to cold may cause your problems to return. °· Cold hypersensitivity. There are many forms of cold hypersensitivity, including: °¨ Cold urticaria. Red, itchy hives appear on the skin when the   tissues begin to warm after being iced. °¨ Cold erythema. This is a red, itchy rash caused by exposure to cold. °¨ Cold hemoglobinuria. Red blood cells break down when the tissues begin to warm after being iced. The hemoglobin that carry oxygen are passed into the urine because they cannot combine with blood proteins fast enough. °· Numbness or altered sensitivity in the area being  iced. °If you have any of the following conditions, do not use ice until you have discussed cryotherapy with your caregiver: °· Heart conditions, such as arrhythmia, angina, or chronic heart disease. °· High blood pressure. °· Healing wounds or open skin in the area being iced. °· Current infections. °· Rheumatoid arthritis. °· Poor circulation. °· Diabetes. °Ice slows the blood flow in the region it is applied. This is beneficial when trying to stop inflamed tissues from spreading irritating chemicals to surrounding tissues. However, if you expose your skin to cold temperatures for too long or without the proper protection, you can damage your skin or nerves. Watch for signs of skin damage due to cold. °HOME CARE INSTRUCTIONS °Follow these tips to use ice and cold packs safely. °· Place a dry or damp towel between the ice and skin. A damp towel will cool the skin more quickly, so you may need to shorten the time that the ice is used. °· For a more rapid response, add gentle compression to the ice. °· Ice for no more than 10 to 20 minutes at a time. The bonier the area you are icing, the less time it will take to get the benefits of ice. °· Check your skin after 5 minutes to make sure there are no signs of a poor response to cold or skin damage. °· Rest 20 minutes or more between uses. °· Once your skin is numb, you can end your treatment. You can test numbness by very lightly touching your skin. The touch should be so light that you do not see the skin dimple from the pressure of your fingertip. When using ice, most people will feel these normal sensations in this order: cold, burning, aching, and numbness. °· Do not use ice on someone who cannot communicate their responses to pain, such as small children or people with dementia. °HOW TO MAKE AN ICE PACK °Ice packs are the most common way to use ice therapy. Other methods include ice massage, ice baths, and cryosprays. Muscle creams that cause a cold, tingly  feeling do not offer the same benefits that ice offers and should not be used as a substitute unless recommended by your caregiver. °To make an ice pack, do one of the following: °· Place crushed ice or a bag of frozen vegetables in a sealable plastic bag. Squeeze out the excess air. Place this bag inside another plastic bag. Slide the bag into a pillowcase or place a damp towel between your skin and the bag. °· Mix 3 parts water with 1 part rubbing alcohol. Freeze the mixture in a sealable plastic bag. When you remove the mixture from the freezer, it will be slushy. Squeeze out the excess air. Place this bag inside another plastic bag. Slide the bag into a pillowcase or place a damp towel between your skin and the bag. °SEEK MEDICAL CARE IF: °· You develop white spots on your skin. This may give the skin a blotchy (mottled) appearance. °· Your skin turns blue or pale. °· Your skin becomes waxy or hard. °· Your swelling gets worse. °MAKE SURE   YOU:  °· Understand these instructions. °· Will watch your condition. °· Will get help right away if you are not doing well or get worse. °  °This information is not intended to replace advice given to you by your health care provider. Make sure you discuss any questions you have with your health care provider. °  °Document Released: 06/01/2011 Document Revised: 10/26/2014 Document Reviewed: 06/01/2011 °Elsevier Interactive Patient Education ©2016 Elsevier Inc. ° °
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
## 2016-05-14 ENCOUNTER — Emergency Department
Admission: EM | Admit: 2016-05-14 | Discharge: 2016-05-14 | Disposition: A | Discharge status: Home / Self Care | Payer: Medicaid Other | Attending: Emergency Medicine | Admitting: Emergency Medicine

## 2016-05-14 ENCOUNTER — Encounter: Payer: Self-pay | Admitting: Emergency Medicine

## 2016-05-14 DIAGNOSIS — Z791 Long term (current) use of non-steroidal anti-inflammatories (NSAID): Secondary | ICD-10-CM | POA: Insufficient documentation

## 2016-05-14 DIAGNOSIS — F410 Panic disorder [episodic paroxysmal anxiety] without agoraphobia: Secondary | ICD-10-CM

## 2016-05-14 DIAGNOSIS — Z79899 Other long term (current) drug therapy: Secondary | ICD-10-CM | POA: Diagnosis not present

## 2016-05-14 DIAGNOSIS — R0602 Shortness of breath: Secondary | ICD-10-CM | POA: Insufficient documentation

## 2016-05-14 DIAGNOSIS — F319 Bipolar disorder, unspecified: Secondary | ICD-10-CM | POA: Insufficient documentation

## 2016-05-14 LAB — CBC
HCT: 40.3 % (ref 35.0–47.0)
Hemoglobin: 13.9 g/dL (ref 12.0–16.0)
MCH: 30.4 pg (ref 26.0–34.0)
MCHC: 34.5 g/dL (ref 32.0–36.0)
MCV: 88.1 fL (ref 80.0–100.0)
PLATELETS: 309 10*3/uL (ref 150–440)
RBC: 4.58 MIL/uL (ref 3.80–5.20)
RDW: 12.6 % (ref 11.5–14.5)
WBC: 12.4 10*3/uL — AB (ref 3.6–11.0)

## 2016-05-14 LAB — BASIC METABOLIC PANEL
Anion gap: 12 (ref 5–15)
BUN: 12 mg/dL (ref 6–20)
CHLORIDE: 106 mmol/L (ref 101–111)
CO2: 20 mmol/L — ABNORMAL LOW (ref 22–32)
CREATININE: 0.89 mg/dL (ref 0.44–1.00)
Calcium: 9.3 mg/dL (ref 8.9–10.3)
Glucose, Bld: 110 mg/dL — ABNORMAL HIGH (ref 65–99)
POTASSIUM: 3.3 mmol/L — AB (ref 3.5–5.1)
SODIUM: 138 mmol/L (ref 135–145)

## 2016-05-14 LAB — TROPONIN I: Troponin I: <0.03 ng/mL (ref <0.03)

## 2016-05-14 NOTE — ED Provider Notes (Signed)
[facility] Emergency Department Provider Note  ____________________________________________   First MD Initiated Contact with Patient 05/14/16 2039     (approximate)  I have reviewed the triage vital signs and the nursing notes.   HISTORY  Chief Complaint Panic Attack    HPI [name] is a 25 y.o. female with history of bipolar disorder and panic attackspresents by EMS after having a panic attack at work.  She says she has been under increased amount of stress recently.  She has had panic attacks at work and at home in the past.  She states that it felt similar to prior with racing heart, some shortness of breath, and some mild chest pain.  Her symptoms completely resolved at this time and she is even able to get some sleep while she was awaiting evaluation.  She is on medication and sees a therapist once every 2 weeks.  She denies any alcohol or drug use.  She denies suicidal ideation and homicidal ideation.  She says the symptoms were acute in onset, severe, but it completely resolved.   Past Medical History:  Diagnosis Date  . Bipolar disorder (HCC)   . Depression   . Genital herpes     Patient Active Problem List   Diagnosis Date Noted  . SOB (shortness of breath) 05/30/2014  . Other chest pain 05/30/2014  . Rapid heart beat 05/30/2014  . Panic attacks 05/30/2014  . Anxiety 05/30/2014    History reviewed. No pertinent surgical history.  Prior to Admission medications   Medication Sig Start Date End Date Taking? Authorizing Provider  cyclobenzaprine (FLEXERIL) 10 MG tablet Take 1 tablet (10 mg total) by mouth 3 (three) times daily as needed for muscle spasms. 02/19/16   Delorise Royals Cuthriell, PA-C  DULoxetine (CYMBALTA) 20 MG capsule Take 30 mg by mouth daily.     Historical Provider, MD  meloxicam (MOBIC) 15 MG tablet Take 1 tablet (15 mg total) by mouth daily. 02/19/16   Delorise Royals Cuthriell, PA-C  naproxen (NAPROSYN) 500 MG tablet Take 1  tablet (500 mg total) by mouth 2 (two) times daily with a meal. 11/30/15   Delorise Royals Cuthriell, PA-C  norgestimate-ethinyl estradiol (SPRINTEC 28) 0.25-35 MG-MCG tablet Take 1 tablet by mouth at bedtime.    Historical Provider, MD  ondansetron (ZOFRAN ODT) 4 MG disintegrating tablet Take 1 tablet (4 mg total) by mouth every 8 (eight) hours as needed for nausea or vomiting. 11/24/15   Sharman Cheek, MD  QUEtiapine (SEROQUEL XR) 400 MG 24 hr tablet Take 400 mg by mouth at bedtime.    Historical Provider, MD    Allergies Nickel  Family History  Problem Relation Age of Onset  . Hyperlipidemia Mother   . Hypertension Mother   . Hyperlipidemia Father     Social History Social History  Substance Use Topics  . Smoking status: Never Smoker  . Smokeless tobacco: Never Used  . Alcohol use Yes     Comment: Rare.     Review of Systems Constitutional: No fever/chills Eyes: No visual changes. ENT: No sore throat. Cardiovascular: +chest pain. Respiratory: +shortness of breath. Gastrointestinal: No abdominal pain.  No nausea, no vomiting.  No diarrhea.  No constipation. Genitourinary: Negative for dysuria. Musculoskeletal: Negative for back pain. Skin: Negative for rash. Neurological: Negative for headaches, focal weakness or numbness.  10-point ROS otherwise negative.  ____________________________________________   PHYSICAL EXAM:  VITAL SIGNS: ED Triage Vitals  Enc Vitals Group     BP  05/14/16 1743 116/83     Pulse Rate 05/14/16 1743 98     Resp 05/14/16 1743 18     Temp 05/14/16 1743 98.7 F (37.1 C)     Temp Source 05/14/16 1743 Oral     SpO2 05/14/16 1743 100 %     Weight 05/14/16 1743 200 lb (90.7 kg)     Height 05/14/16 1743 5\' 3"  (1.6 m)     Head Circumference --      Peak Flow --      Pain Score 05/14/16 1747 9     Pain Loc --      Pain Edu? --      Excl. in GC? --     Constitutional: Alert and oriented. Well appearing and in no acute distress. Eyes:  Conjunctivae are normal. PERRL. EOMI. Head: Atraumatic. Nose: No congestion/rhinnorhea. Mouth/Throat: Mucous membranes are moist.  Oropharynx non-erythematous. Neck: No stridor.  No meningeal signs.   Cardiovascular: Normal rate, regular rhythm. Good peripheral circulation. Grossly normal heart sounds.   Respiratory: Normal respiratory effort.  No retractions. Lungs CTAB. Gastrointestinal: Soft and nontender. No distention.  Musculoskeletal: No lower extremity tenderness nor edema. No gross deformities of extremities. Neurologic:  Normal speech and language. No gross focal neurologic deficits are appreciated.  Skin:  Skin is warm, dry and intact. No rash noted. Psychiatric: Mood and affect are normal. Speech and behavior are normal.  ____________________________________________   LABS (all labs ordered are listed, but only abnormal results are displayed)  Labs Reviewed  BASIC METABOLIC PANEL - Abnormal; Notable for the following:       Result Value   Potassium 3.3 (*)    CO2 20 (*)    Glucose, Bld 110 (*)    All other components within normal limits  CBC - Abnormal; Notable for the following:    WBC 12.4 (*)    All other components within normal limits  TROPONIN I   ____________________________________________  EKG  ED ECG REPORT I, Winfrey Chillemi, the attending physician, personally viewed and interpreted this ECG.  Date: 05/14/2016 Rate: 80 Rhythm: normal sinus rhythm QRS Axis: normal Intervals: normal ST/T Wave abnormalities: normal Conduction Disturbances: none Narrative Interpretation: unremarkable  ____________________________________________  RADIOLOGY   No results found.  ____________________________________________   PROCEDURES  Procedure(s) performed:   Procedures   ____________________________________________   INITIAL IMPRESSION / ASSESSMENT AND PLAN / ED COURSE  Pertinent labs & imaging results that were available during my care of the  patient were reviewed by me and considered in my medical decision making (see chart for details).  Uncomplicated panic attack, now resolved.  Patient has good follow-up available.  I gave my usual and customary return precautions.     Clinical Course    ____________________________________________  FINAL CLINICAL IMPRESSION(S) / ED DIAGNOSES  Final diagnoses:  Panic attack     MEDICATIONS GIVEN DURING THIS VISIT:  Medications - No data to display   NEW OUTPATIENT MEDICATIONS STARTED DURING THIS VISIT:  Discharge Medication List as of 05/14/2016  8:56 PM        Note:  This document was prepared using Dragon voice recognition software and may include unintentional dictation errors.    Loleta Rose, MD 05/14/16 [phone]

## 2016-05-14 NOTE — ED Notes (Signed)
This RN noted superficial scratches to patient's left arm.  Patient states she scratched her arm with a knife 2 days ago.  Patient denies thoughts of hurting herself today.  Patient states, "they kind of come and go, but I'm not planning on killing myself."

## 2016-05-14 NOTE — ED Triage Notes (Signed)
Patient presents to the ED via EMS from work with a panic attack.  Patient reports having a panic disorder that she takes cymbalta daily for.  Patient hyperventilating in triage and reports chest pain and feeling like she is going to pass out.  Patient states she was feeling stressed at work.  Patient states she is currently feeling the way she usually does when she has a panic attack.

## 2016-07-02 ENCOUNTER — Emergency Department
Admission: EM | Admit: 2016-07-02 | Discharge: 2016-07-02 | Disposition: A | Discharge status: Home / Self Care | Payer: Medicaid Other | Attending: Emergency Medicine | Admitting: Emergency Medicine

## 2016-07-02 ENCOUNTER — Encounter: Payer: Self-pay | Admitting: Emergency Medicine

## 2016-07-02 ENCOUNTER — Emergency Department: Payer: Medicaid Other

## 2016-07-02 DIAGNOSIS — M25562 Pain in left knee: Secondary | ICD-10-CM | POA: Diagnosis not present

## 2016-07-02 DIAGNOSIS — S93402A Sprain of unspecified ligament of left ankle, initial encounter: Secondary | ICD-10-CM | POA: Diagnosis not present

## 2016-07-02 DIAGNOSIS — Z79899 Other long term (current) drug therapy: Secondary | ICD-10-CM | POA: Diagnosis not present

## 2016-07-02 DIAGNOSIS — X501XXA Overexertion from prolonged static or awkward postures, initial encounter: Secondary | ICD-10-CM | POA: Insufficient documentation

## 2016-07-02 DIAGNOSIS — Y9389 Activity, other specified: Secondary | ICD-10-CM | POA: Diagnosis not present

## 2016-07-02 DIAGNOSIS — Y999 Unspecified external cause status: Secondary | ICD-10-CM | POA: Insufficient documentation

## 2016-07-02 DIAGNOSIS — Y929 Unspecified place or not applicable: Secondary | ICD-10-CM | POA: Diagnosis not present

## 2016-07-02 DIAGNOSIS — S99912A Unspecified injury of left ankle, initial encounter: Secondary | ICD-10-CM | POA: Diagnosis present

## 2016-07-02 MED ORDER — KETOROLAC TROMETHAMINE 60 MG/2ML IM SOLN
60.0000 mg | Freq: Once | INTRAMUSCULAR | Status: AC
  Administered 2016-07-02: 60 mg via INTRAMUSCULAR
  Filled 2016-07-02: qty 2

## 2016-07-02 MED ORDER — IBUPROFEN 800 MG PO TABS: 800.0000 mg | ORAL_TABLET | Freq: Three times a day (TID) | ORAL | 0 refills | Status: DC | PRN

## 2016-07-02 NOTE — ED Triage Notes (Signed)
Pt to triage via w/c with no distress noted; st about 1130pm, twisted left knee & ankle while chasing a shopping cart

## 2016-07-03 NOTE — ED Provider Notes (Signed)
[facility] Emergency Department Provider Note    First MD Initiated Contact with Patient 07/02/16 [phone]     (approximate)  I have reviewed the triage vital signs and the nursing notes.   HISTORY  Chief Complaint No chief complaint on file.    HPI [name] is a 25 y.o. female with history of bipolar disorder genital herpes and depression presents to emergency department history of twisting her left ankle and knee [place]   Past Medical History:  Diagnosis Date  . Bipolar disorder (HCC)   . Depression   . Genital herpes     Patient Active Problem List   Diagnosis Date Noted  . SOB (shortness of breath) 05/30/2014  . Other chest pain 05/30/2014  . Rapid heart beat 05/30/2014  . Panic attacks 05/30/2014  . Anxiety 05/30/2014    History reviewed. No pertinent surgical history.  Prior to Admission medications   Medication Sig Start Date End Date Taking? Authorizing Provider  DULoxetine (CYMBALTA) 20 MG capsule Take 30 mg by mouth daily.    Yes Historical Provider, MD  norgestimate-ethinyl estradiol (SPRINTEC 28) 0.25-35 MG-MCG tablet Take 1 tablet by mouth at bedtime.   Yes Historical Provider, MD  QUEtiapine (SEROQUEL XR) 400 MG 24 hr tablet Take 400 mg by mouth at bedtime.   Yes Historical Provider, MD  cyclobenzaprine (FLEXERIL) 10 MG tablet Take 1 tablet (10 mg total) by mouth 3 (three) times daily as needed for muscle spasms. 02/19/16   Delorise RoyalsJonathan D Cuthriell, PA-C  ibuprofen (ADVIL,MOTRIN) 800 MG tablet Take 1 tablet (800 mg total) by mouth every 8 (eight) hours as needed. 07/02/16   Darci Currentandolph N Vincen Bejar, MD  meloxicam (MOBIC) 15 MG tablet Take 1 tablet (15 mg total) by mouth daily. 02/19/16   Delorise RoyalsJonathan D Cuthriell, PA-C  naproxen (NAPROSYN) 500 MG tablet Take 1 tablet (500 mg total) by mouth 2 (two) times daily with a meal. 11/30/15   Delorise RoyalsJonathan D Cuthriell, PA-C  ondansetron (ZOFRAN ODT) 4 MG disintegrating  tablet Take 1 tablet (4 mg total) by mouth every 8 (eight) hours as needed for nausea or vomiting. 11/24/15   Sharman CheekPhillip Stafford, MD    Allergies Nickel  Family History  Problem Relation Age of Onset  . Hyperlipidemia Mother   . Hypertension Mother   . Hyperlipidemia Father     Social History Social History  Substance Use Topics  . Smoking status: Never Smoker  . Smokeless tobacco: Never Used  . Alcohol use Yes     Comment: Rare.     Review of Systems Constitutional: No fever/chills Eyes: No visual changes. ENT: No sore throat. Cardiovascular: Denies chest pain. Respiratory: Denies shortness of breath. Gastrointestinal: No abdominal pain.  No nausea, no vomiting.  No diarrhea.  No constipation. Genitourinary: Negative for dysuria. Musculoskeletal: Negative for back pain.Positive for left ankle and knee pain Skin: Negative for rash. Neurological: Negative for headaches, focal weakness or numbness.  10-point ROS otherwise negative.  ____________________________________________   PHYSICAL EXAM:  VITAL SIGNS: ED Triage Vitals  Enc Vitals Group     BP 07/02/16 0144 131/81     Pulse Rate 07/02/16 0144 (!) 102     Resp 07/02/16 0144 20     Temp 07/02/16 0144 97.7 F (36.5 C)     Temp Source 07/02/16 0144 Oral     SpO2 07/02/16 0144 100 %     Weight 07/02/16 0143 200 lb (90.7 kg)  Height 07/02/16 0143 5\' 3"  (1.6 m)     Head Circumference --      Peak Flow --      Pain Score 07/02/16 0143 9     Pain Loc --      Pain Edu? --      Excl. in GC? --     Constitutional: Alert and oriented. Well appearing and in no acute distress. Eyes: Conjunctivae are normal. PERRL. EOMI. Head: Atraumatic. Mouth/Throat: Mucous membranes are moist.  Oropharynx non-erythematous. Neck: No stridor.  No meningeal signs.   Cardiovascular: Normal rate, regular rhythm. Good peripheral circulation. Grossly normal heart sounds. Respiratory: Normal respiratory effort.  No retractions. Lungs  CTAB. Gastrointestinal: Soft and nontender. No distention.  Musculoskeletal: No lower extremity tenderness nor edema. No gross deformities of extremities. Neurologic:  Normal speech and language. No gross focal neurologic deficits are appreciated.  Skin:  Skin is warm, dry and intact. No rash noted. Psychiatric: Mood and affect are normal. Speech and behavior are normal.   RADIOLOGY I, Hollins N Shayne Diguglielmo, personally viewed and evaluated these images (plain radiographs) as part of my medical decision making, as well as reviewing the written report by the radiologist. CLINICAL DATA:  Pain after twisting injury at 23:30 tonight  EXAM: LEFT ANKLE COMPLETE - 3+ VIEW  COMPARISON:  None.  FINDINGS: There is no evidence of fracture, dislocation, or joint effusion. There is no evidence of arthropathy or other focal bone abnormality. Soft tissues are unremarkable.  IMPRESSION: Negative.   Electronically Signed   By: Ellery Plunk M.D.   On: 07/02/2016 02:13 CLINICAL DATA:  Pain after twisting injury at 23:30 tonight  EXAM: LEFT KNEE - COMPLETE 4+ VIEW  COMPARISON:  None.  FINDINGS: No evidence of fracture, dislocation, or joint effusion. No evidence of arthropathy or other focal bone abnormality. Soft tissues are unremarkable.  IMPRESSION: Negative.   Electronically Signed   By: Ellery Plunk M.D.   On: 07/02/2016 02:12   Procedures     INITIAL IMPRESSION / ASSESSMENT AND PLAN / ED COURSE  Pertinent labs & imaging results that were available during my care of the patient were reviewed by me and considered in my medical decision making (see chart for details).    Clinical Course    ____________________________________________  FINAL CLINICAL IMPRESSION(S) / ED DIAGNOSES  Final diagnoses:  Left ankle sprain, initial encounter  Left knee pain     MEDICATIONS GIVEN DURING THIS VISIT:  Medications  ketorolac (TORADOL) injection 60 mg  (60 mg Intramuscular Given 07/02/16 0426)     NEW OUTPATIENT MEDICATIONS STARTED DURING THIS VISIT:  Discharge Medication List as of 07/02/2016  4:43 AM    START taking these medications   Details  ibuprofen (ADVIL,MOTRIN) 800 MG tablet Take 1 tablet (800 mg total) by mouth every 8 (eight) hours as needed., Starting Thu 07/02/2016, Print        Discharge Medication List as of 07/02/2016  4:43 AM      Discharge Medication List as of 07/02/2016  4:43 AM       Note:  This document was prepared using Dragon voice recognition software and may include unintentional dictation errors.    Darci Current, MD 07/03/16 [phone]

## 2016-07-30 ENCOUNTER — Encounter: Payer: Self-pay | Admitting: Emergency Medicine

## 2016-07-30 ENCOUNTER — Emergency Department
Admission: EM | Admit: 2016-07-30 | Discharge: 2016-07-30 | Disposition: A | Discharge status: Home / Self Care | Payer: Medicaid Other | Attending: Emergency Medicine | Admitting: Emergency Medicine

## 2016-07-30 DIAGNOSIS — F410 Panic disorder [episodic paroxysmal anxiety] without agoraphobia: Secondary | ICD-10-CM | POA: Insufficient documentation

## 2016-07-30 MED ORDER — LORAZEPAM 0.5 MG PO TABS
0.5000 mg | ORAL_TABLET | Freq: Once | ORAL | Status: AC
  Administered 2016-07-30: 0.5 mg via ORAL
  Filled 2016-07-30: qty 1

## 2016-07-30 NOTE — ED Notes (Signed)
Pt arrived via wheelchair to triage by EMS.  C/o anxiety, VS WNL

## 2016-07-30 NOTE — Discharge Instructions (Signed)
Prefer not to have blood drawn which is certainly her choice but does limit what we can evaluate you for here. We are reassured that this for things look well and that you feel better. Without blood work there are some things we cannot rule out. For this reason we stressed that if you feel worse in any way including chest pain shortness of breath any thoughts of hurting herself or anyone else, or any other concerning symptoms you  return to the emergency department. Follow closely with your therapist and primary care physician.

## 2016-07-30 NOTE — ED Notes (Signed)
Pt refuses any blood work till a provider evaluates her, " I am scared  Of needles, EKG performed for chest discomfort

## 2016-07-30 NOTE — ED Provider Notes (Signed)
[facility] Emergency Department Provider Note  ____________________________________________   I have reviewed the triage vital signs and the nursing notes.   HISTORY  Chief Complaint Panic Attack    HPI [name] is a 25 y.o. female who states that she has history of chronic panic attacks, who states that she had a panic today. She has panic attacks with great frequency she states. This was exactly like all of her prior panic attacks. No history of blood clot or PE. She states she has been able to calm down she has no symptoms. She is refusing blood draw or any further testing. She would just like to be discharged. She's been here for panic attacks in the past. She states that she did not passed out. She states that when she gets panic attacks, she sometimes will zones out". She states that she has mild chest tightness when she has and this is exactly the same as multiple other episodes of panic attacks which she has had for years. She sees a psychiatrist and therapist and she takes medication for it.      Past Medical History:  Diagnosis Date  . Bipolar disorder (HCC)   . Depression   . Genital herpes     Patient Active Problem List   Diagnosis Date Noted  . SOB (shortness of breath) 05/30/2014  . Other chest pain 05/30/2014  . Rapid heart beat 05/30/2014  . Panic attacks 05/30/2014  . Anxiety 05/30/2014    History reviewed. No pertinent surgical history.  Prior to Admission medications   Medication Sig Start Date End Date Taking? Authorizing Provider  cyclobenzaprine (FLEXERIL) 10 MG tablet Take 1 tablet (10 mg total) by mouth 3 (three) times daily as needed for muscle spasms. 02/19/16   Delorise RoyalsJonathan D Cuthriell, PA-C  DULoxetine (CYMBALTA) 20 MG capsule Take 30 mg by mouth daily.     Historical Provider, MD  ibuprofen (ADVIL,MOTRIN) 800 MG tablet Take 1 tablet (800 mg total) by mouth every 8 (eight) hours as needed. 07/02/16   Darci Currentandolph N Brown, MD   meloxicam (MOBIC) 15 MG tablet Take 1 tablet (15 mg total) by mouth daily. 02/19/16   Delorise RoyalsJonathan D Cuthriell, PA-C  naproxen (NAPROSYN) 500 MG tablet Take 1 tablet (500 mg total) by mouth 2 (two) times daily with a meal. 11/30/15   Delorise RoyalsJonathan D Cuthriell, PA-C  norgestimate-ethinyl estradiol (SPRINTEC 28) 0.25-35 MG-MCG tablet Take 1 tablet by mouth at bedtime.    Historical Provider, MD  ondansetron (ZOFRAN ODT) 4 MG disintegrating tablet Take 1 tablet (4 mg total) by mouth every 8 (eight) hours as needed for nausea or vomiting. 11/24/15   Sharman CheekPhillip Stafford, MD  QUEtiapine (SEROQUEL XR) 400 MG 24 hr tablet Take 400 mg by mouth at bedtime.    Historical Provider, MD    Allergies Nickel  Family History  Problem Relation Age of Onset  . Hyperlipidemia Mother   . Hypertension Mother   . Hyperlipidemia Father     Social History Social History  Substance Use Topics  . Smoking status: Never Smoker  . Smokeless tobacco: Never Used  . Alcohol use Yes     Comment: Rare.     Review of Systems Constitutional: No fever/chills Eyes: No visual changes. ENT: No sore throat. No stiff neck no neck pain Cardiovascular: Denies chest pain.At this time Respiratory: Denies shortness of breath. Gastrointestinal:   no vomiting.  No diarrhea.  No constipation. Genitourinary: Negative for dysuria. Musculoskeletal: Negative lower extremity swelling Skin:  Negative for rash. Neurological: Negative for severe headaches, focal weakness or numbness. 10-point ROS otherwise negative.  ____________________________________________   PHYSICAL EXAM:  VITAL SIGNS: ED Triage Vitals  Enc Vitals Group     BP 07/30/16 1543 116/74     Pulse Rate 07/30/16 1543 (!) 101     Resp 07/30/16 1543 18     Temp 07/30/16 1543 98.1 F (36.7 C)     Temp Source 07/30/16 1543 Oral     SpO2 07/30/16 1543 99 %     Weight 07/30/16 1543 200 lb (90.7 kg)     Height 07/30/16 1543 5\' 3"  (1.6 m)     Head Circumference --      Peak  Flow --      Pain Score 07/30/16 1544 7     Pain Loc --      Pain Edu? --      Excl. in GC? --     Constitutional: Alert and oriented. Well appearing and in no acute distress. Eyes: Conjunctivae are normal. PERRL. EOMI. Head: Atraumatic. Nose: No congestion/rhinnorhea. Mouth/Throat: Mucous membranes are moist.  Oropharynx non-erythematous. Neck: No stridor.   Nontender with no meningismus Cardiovascular: Normal rate, regular rhythm. Grossly normal heart sounds.  Good peripheral circulation. Respiratory: Normal respiratory effort.  No retractions. Lungs CTAB. Abdominal: Soft and nontender. No distention. No guarding no rebound Back:  There is no focal tenderness or step off.  there is no midline tenderness there are no lesions noted. there is no CVA tenderness  Musculoskeletal: No lower extremity tenderness, no upper extremity tenderness. No joint effusions, no DVT signs strong distal pulses no edema Neurologic:  Normal speech and language. No gross focal neurologic deficits are appreciated.  Skin:  Skin is warm, dry and intact. No rash noted. Psychiatric: Mood and affect are normal. Speech and behavior are normal.  ____________________________________________   LABS (all labs ordered are listed, but only abnormal results are displayed)  Labs Reviewed - No data to display ____________________________________________  EKG  I personally interpreted any EKGs ordered by me or triage Sinus rhythm mild tachycardia noted rate 103 normal axis, not suggestive flattening no acute ischemic changes ____________________________________________  RADIOLOGY  I reviewed any imaging ordered by me or triage that were performed during my shift and, if possible, patient and/or family made aware of any abnormal findings. ____________________________________________   PROCEDURES  Procedure(s) performed: None  Procedures  Critical Care performed:  None  ____________________________________________   INITIAL IMPRESSION / ASSESSMENT AND PLAN / ED COURSE  Pertinent labs & imaging results that were available during my care of the patient were reviewed by me and considered in my medical decision making (see chart for details).  Patient here claiming that she had a panic attack. She refuses further workup she feels 100% better and she wants to go home. I have low suspicion for ACS PE or dissection etc. however, obviously cannot rule those things out any further without further imaging or blood work and patient is disinclined to display not at this time she states "I just want to go home". She is not driving. She is asking for some A for her nerves before she goes. Her heart rate at this time is 63, she has a very reassuring exam and her history is consistent with what she describes as a long history of panic attacks. A very low suspicion for any other acute pathology and I do not feel uncomfortable discharging her without further workup but it has been made clear  that without further workup I cannot rule anything else out. The patient's requesting some Ativan prior to discharge as her mother is driving. She has no SI or HI and denies being abused. Return precautions and follow-up are given and understood  Clinical Course   ____________________________________________   FINAL CLINICAL IMPRESSION(S) / ED DIAGNOSES  Final diagnoses:  None      This chart was dictated using voice recognition software.  Despite best efforts to proofread,  errors can occur which can change meaning.      Jeanmarie Plant, MD 07/30/16 [phone]

## 2016-07-30 NOTE — ED Notes (Signed)
Pt states she had panic attack at work around 1500. Unaware of duration, states her coworkers said she was in and out of consciousness. Denies any triggers that precipitated event.Pt hx of panic attacks. Pt A&O, c/o some generalized CP , denies SOB . NSR on monitor

## 2016-07-30 NOTE — ED Triage Notes (Signed)
Ems pt to lobby, from work with a panic attack, hx of panic attacks, pt currently ahving chest discomfort, pt states is the same feeling she has had in the past, frequency of panic attack " a couple a month"  Pt calm and cooperative, skin warm and dry, even unlabored respirations

## 2016-08-17 ENCOUNTER — Emergency Department
Admission: EM | Admit: 2016-08-17 | Discharge: 2016-08-17 | Disposition: A | Discharge status: Home / Self Care | Payer: Medicaid Other | Attending: Emergency Medicine | Admitting: Emergency Medicine

## 2016-08-17 DIAGNOSIS — Z791 Long term (current) use of non-steroidal anti-inflammatories (NSAID): Secondary | ICD-10-CM | POA: Diagnosis not present

## 2016-08-17 DIAGNOSIS — L03113 Cellulitis of right upper limb: Secondary | ICD-10-CM | POA: Diagnosis not present

## 2016-08-17 DIAGNOSIS — Z79899 Other long term (current) drug therapy: Secondary | ICD-10-CM | POA: Insufficient documentation

## 2016-08-17 DIAGNOSIS — L0291 Cutaneous abscess, unspecified: Secondary | ICD-10-CM

## 2016-08-17 DIAGNOSIS — L089 Local infection of the skin and subcutaneous tissue, unspecified: Secondary | ICD-10-CM | POA: Diagnosis present

## 2016-08-17 DIAGNOSIS — L02413 Cutaneous abscess of right upper limb: Secondary | ICD-10-CM | POA: Insufficient documentation

## 2016-08-17 LAB — CBC WITH DIFFERENTIAL/PLATELET
BASOS PCT: 1 %
Basophils Absolute: 0.1 10*3/uL (ref 0–0.1)
Eosinophils Absolute: 0.2 10*3/uL (ref 0–0.7)
Eosinophils Relative: 2 %
HEMATOCRIT: 38.8 % (ref 35.0–47.0)
HEMOGLOBIN: 13.1 g/dL (ref 12.0–16.0)
LYMPHS ABS: 2.8 10*3/uL (ref 1.0–3.6)
Lymphocytes Relative: 31 %
MCH: 29.8 pg (ref 26.0–34.0)
MCHC: 33.8 g/dL (ref 32.0–36.0)
MCV: 88.1 fL (ref 80.0–100.0)
MONOS PCT: 7 %
Monocytes Absolute: 0.6 10*3/uL (ref 0.2–0.9)
NEUTROS ABS: 5.5 10*3/uL (ref 1.4–6.5)
NEUTROS PCT: 59 %
Platelets: 342 10*3/uL (ref 150–440)
RBC: 4.4 MIL/uL (ref 3.80–5.20)
RDW: 12.9 % (ref 11.5–14.5)
WBC: 9.2 10*3/uL (ref 3.6–11.0)

## 2016-08-17 LAB — COMPREHENSIVE METABOLIC PANEL
ALBUMIN: 3.6 g/dL (ref 3.5–5.0)
ALK PHOS: 75 U/L (ref 38–126)
ALT: 18 U/L (ref 14–54)
ANION GAP: 8 (ref 5–15)
AST: 21 U/L (ref 15–41)
BUN: 11 mg/dL (ref 6–20)
CALCIUM: 8.7 mg/dL — AB (ref 8.9–10.3)
CO2: 22 mmol/L (ref 22–32)
CREATININE: 0.98 mg/dL (ref 0.44–1.00)
Chloride: 101 mmol/L (ref 101–111)
GFR calc Af Amer: >60 mL/min (ref >60)
GFR calc non Af Amer: >60 mL/min (ref >60)
Glucose, Bld: 109 mg/dL — ABNORMAL HIGH (ref 65–99)
Potassium: 3.4 mmol/L — ABNORMAL LOW (ref 3.5–5.1)
Sodium: 131 mmol/L — ABNORMAL LOW (ref 135–145)
TOTAL PROTEIN: 7.3 g/dL (ref 6.5–8.1)

## 2016-08-17 LAB — LACTIC ACID, PLASMA: Lactic Acid, Venous: 1.2 mmol/L (ref 0.5–1.9)

## 2016-08-17 MED ORDER — CLINDAMYCIN PHOSPHATE 600 MG/50ML IV SOLN
600.0000 mg | Freq: Once | INTRAVENOUS | Status: AC
  Administered 2016-08-17: 600 mg via INTRAVENOUS
  Filled 2016-08-17: qty 50

## 2016-08-17 MED ORDER — CLINDAMYCIN HCL 300 MG PO CAPS: 300.0000 mg | ORAL_CAPSULE | Freq: Four times a day (QID) | ORAL | 0 refills | Status: DC

## 2016-08-17 MED ORDER — SODIUM CHLORIDE 0.9 % IV BOLUS (SEPSIS)
1000.0000 mL | Freq: Once | INTRAVENOUS | Status: AC
  Administered 2016-08-17: 1000 mL via INTRAVENOUS

## 2016-08-17 NOTE — ED Triage Notes (Signed)
Pt reports going to [facility] 3 days ago and was put on Bactrim for "several bumps" near a new tattoo.  Pt reports bumps being there for a week.  [facility] her it was a st[facility] infection.  Pt reports increased pain, redness, and swelling to area.

## 2016-08-17 NOTE — ED Notes (Signed)
Pt discharged to home.  Family member driving.  Discharge instructions reviewed.  Verbalized understanding.  No questions or concerns at this time.  Teach back verified.  Pt in NAD.  No items left in ED.   

## 2016-08-17 NOTE — ED Provider Notes (Signed)
[facility] Emergency Department Provider Note  ____________________________________________  Time seen: Approximately 7:43 PM  I have reviewed the triage vital signs and the nursing notes.   HISTORY  Chief Complaint Wound Infection    HPI [name] is a 25 y.o. female who presents emergency department complaining of a worsening infection to her right forearm. Patient reports that she had her boyfriend perform a home tattoo to her arm that became infected. She presented to urgent care in town and was diagnosed with staph infection and placed on oral antibiotics. Patient reports that the erythema, edema, pain have increased. Now area is openly using pustulant material. Patient denies any fevers or chills, chest pain, shortness of breath, abdominal pain, nausea or vomiting. No history of reoccurring skin infections. Patient states that she has been using the Bactrim as prescribed from urgent care.   Past Medical History:  Diagnosis Date  . Bipolar disorder (HCC)   . Depression   . Genital herpes     Patient Active Problem List   Diagnosis Date Noted  . SOB (shortness of breath) 05/30/2014  . Other chest pain 05/30/2014  . Rapid heart beat 05/30/2014  . Panic attacks 05/30/2014  . Anxiety 05/30/2014    History reviewed. No pertinent surgical history.  Prior to Admission medications   Medication Sig Start Date End Date Taking? Authorizing Provider  clindamycin (CLEOCIN) 300 MG capsule Take 1 capsule (300 mg total) by mouth 4 (four) times daily. 08/17/16   Delorise RoyalsJonathan D Hazelynn Mckenny, PA-C  cyclobenzaprine (FLEXERIL) 10 MG tablet Take 1 tablet (10 mg total) by mouth 3 (three) times daily as needed for muscle spasms. 02/19/16   Delorise RoyalsJonathan D Bali Lyn, PA-C  DULoxetine (CYMBALTA) 20 MG capsule Take 30 mg by mouth daily.     Historical Provider, MD  ibuprofen (ADVIL,MOTRIN) 800 MG tablet Take 1 tablet (800 mg total) by mouth every 8 (eight) hours as needed.  07/02/16   Darci Currentandolph N Brown, MD  meloxicam (MOBIC) 15 MG tablet Take 1 tablet (15 mg total) by mouth daily. 02/19/16   Delorise RoyalsJonathan D Maresa Morash, PA-C  naproxen (NAPROSYN) 500 MG tablet Take 1 tablet (500 mg total) by mouth 2 (two) times daily with a meal. 11/30/15   Delorise RoyalsJonathan D Naleyah Ohlinger, PA-C  norgestimate-ethinyl estradiol (SPRINTEC 28) 0.25-35 MG-MCG tablet Take 1 tablet by mouth at bedtime.    Historical Provider, MD  ondansetron (ZOFRAN ODT) 4 MG disintegrating tablet Take 1 tablet (4 mg total) by mouth every 8 (eight) hours as needed for nausea or vomiting. 11/24/15   Sharman CheekPhillip Stafford, MD  QUEtiapine (SEROQUEL XR) 400 MG 24 hr tablet Take 400 mg by mouth at bedtime.    Historical Provider, MD    Allergies Nickel  Family History  Problem Relation Age of Onset  . Hyperlipidemia Mother   . Hypertension Mother   . Hyperlipidemia Father     Social History Social History  Substance Use Topics  . Smoking status: Never Smoker  . Smokeless tobacco: Never Used  . Alcohol use Yes     Comment: Rare.      Review of Systems  Constitutional: No fever/chills Eyes: No visual changes.  Cardiovascular: no chest pain. Respiratory: no cough. No SOB. Gastrointestinal: No abdominal pain.  No nausea, no vomiting.  No diarrhea.  No constipation. Musculoskeletal: Negative for musculoskeletal pain. Skin: Positive for worsening infection to the right forearm Neurological: Negative for headaches, focal weakness or numbness. 10-point ROS otherwise negative.  ____________________________________________   PHYSICAL EXAM:  VITAL SIGNS: ED Triage Vitals  Enc Vitals Group     BP 08/17/16 1929 137/86     Pulse Rate 08/17/16 1929 98     Resp 08/17/16 1929 18     Temp 08/17/16 1929 98.5 F (36.9 C)     Temp Source 08/17/16 1929 Oral     SpO2 08/17/16 1929 98 %     Weight 08/17/16 1932 200 lb (90.7 kg)     Height 08/17/16 1932 5\' 3"  (1.6 m)     Head Circumference --      Peak Flow --      Pain Score  08/17/16 1932 7     Pain Loc --      Pain Edu? --      Excl. in GC? --      Constitutional: Alert and oriented. Well appearing and in no acute distress. Eyes: Conjunctivae are normal. PERRL. EOMI. Head: Atraumatic. Neck: No stridor.   Hematological/Lymphatic/Immunilogical: No cervical lymphadenopathy. Cardiovascular: Normal rate, regular rhythm. Normal S1 and S2.  Good peripheral circulation. Respiratory: Normal respiratory effort without tachypnea or retractions. Lungs CTAB. Good air entry to the bases with no decreased or absent breath sounds. Musculoskeletal: Full range of motion to all extremities. No gross deformities appreciated. Neurologic:  Normal speech and language. No gross focal neurologic deficits are appreciated.  Skin:  Skin is warm, dry and intact. No rash noted. Erythema and edema is noted to the right medial forearm. At its widest, area is approximately 10 cm in diameter. 2 lesions are noted in this region, one is firm the other is open and draining. Sensation and pulses intact distally. Psychiatric: Mood and affect are normal. Speech and behavior are normal. Patient exhibits appropriate insight and judgement.   ____________________________________________   LABS (all labs ordered are listed, but only abnormal results are displayed)  Labs Reviewed  COMPREHENSIVE METABOLIC PANEL - Abnormal; Notable for the following:       Result Value   Sodium 131 (*)    Potassium 3.4 (*)    Glucose, Bld 109 (*)    Calcium 8.7 (*)    Total Bilirubin <0.1 (*)    All other components within normal limits  CULTURE, BLOOD (ROUTINE X 2)  CULTURE, BLOOD (ROUTINE X 2)  CBC WITH DIFFERENTIAL/PLATELET  LACTIC ACID, PLASMA   ____________________________________________  EKG   ____________________________________________  RADIOLOGY   No results found.  ____________________________________________    PROCEDURES  Procedure(s) performed:    Procedures    Medications   sodium chloride 0.9 % bolus 1,000 mL (0 mLs Intravenous Stopped 08/17/16 2247)  clindamycin (CLEOCIN) IVPB 600 mg (0 mg Intravenous Stopped 08/17/16 2032)     ____________________________________________   INITIAL IMPRESSION / ASSESSMENT AND PLAN / ED COURSE  Pertinent labs & imaging results that were available during my care of the patient were reviewed by me and considered in my medical decision making (see chart for details).  Review of the  [place] was performed in accordance of the [place] prior to dispensing any controlled drugs.  Clinical Course    Patient's diagnosis is consistent with Worsening cellulitis and abscess to the right forearm. Patient was given IV antibiotics in the emergency department and labs were ordered. Patient's results are reassuring with no indication of acute septicemia or bacteremia. Patient's exam is reassuring with no indication for incision and drainage as area is already open and draining on and on its own. Patient was afebrile, within normal limits.. Patient will be placed on oral  clindamycin at home. Patient is given strict precautions to return for any worsening symptoms. Otherwise, patient will follow-up with primary care..  Patient is given ED precautions to return to the ED for any worsening or new symptoms.     ____________________________________________  FINAL CLINICAL IMPRESSION(S) / ED DIAGNOSES  Final diagnoses:  Cellulitis of right upper extremity  Abscess      NEW MEDICATIONS STARTED DURING THIS VISIT:  Discharge Medication List as of 08/17/2016 10:43 PM    START taking these medications   Details  clindamycin (CLEOCIN) 300 MG capsule Take 1 capsule (300 mg total) by mouth 4 (four) times daily., Starting Mon 08/17/2016, Print            This chart was dictated using voice recognition software/Dragon. Despite best efforts to proofread, errors can occur which can change the meaning. Any change was purely unintentional.     Racheal Patches, PA-C 08/17/16 2255    Sharman Cheek, MD 08/17/16 2352

## 2016-08-22 LAB — CULTURE, BLOOD (ROUTINE X 2)
Culture: NO GROWTH
Culture: NO GROWTH

## 2016-09-19 ENCOUNTER — Emergency Department
Admission: EM | Admit: 2016-09-19 | Discharge: 2016-09-19 | Disposition: A | Discharge status: Home / Self Care | Payer: Medicaid Other | Attending: Emergency Medicine | Admitting: Emergency Medicine

## 2016-09-19 ENCOUNTER — Encounter: Payer: Self-pay | Admitting: *Deleted

## 2016-09-19 DIAGNOSIS — L02411 Cutaneous abscess of right axilla: Secondary | ICD-10-CM | POA: Diagnosis not present

## 2016-09-19 DIAGNOSIS — L02412 Cutaneous abscess of left axilla: Secondary | ICD-10-CM | POA: Insufficient documentation

## 2016-09-19 DIAGNOSIS — Z79899 Other long term (current) drug therapy: Secondary | ICD-10-CM | POA: Diagnosis not present

## 2016-09-19 DIAGNOSIS — L0291 Cutaneous abscess, unspecified: Secondary | ICD-10-CM

## 2016-09-19 HISTORY — DX: Cutaneous abscess, unspecified: L02.91

## 2016-09-19 MED ORDER — CLINDAMYCIN HCL 150 MG PO CAPS
300.0000 mg | ORAL_CAPSULE | Freq: Once | ORAL | Status: AC
  Administered 2016-09-19: 300 mg via ORAL
  Filled 2016-09-19: qty 2

## 2016-09-19 MED ORDER — HYDROCODONE-ACETAMINOPHEN 5-325 MG PO TABS
1.0000 | ORAL_TABLET | Freq: Once | ORAL | Status: AC
  Administered 2016-09-19: 1 via ORAL
  Filled 2016-09-19: qty 1

## 2016-09-19 MED ORDER — HYDROCODONE-ACETAMINOPHEN 5-325 MG PO TABS: 1.0000 | ORAL_TABLET | Freq: Four times a day (QID) | ORAL | 0 refills | Status: DC | PRN

## 2016-09-19 MED ORDER — IBUPROFEN 800 MG PO TABS
800.0000 mg | ORAL_TABLET | Freq: Once | ORAL | Status: AC
  Administered 2016-09-19: 800 mg via ORAL
  Filled 2016-09-19: qty 1

## 2016-09-19 MED ORDER — IBUPROFEN 800 MG PO TABS: 800.0000 mg | ORAL_TABLET | Freq: Three times a day (TID) | ORAL | 0 refills | Status: DC | PRN

## 2016-09-19 MED ORDER — CLINDAMYCIN HCL 300 MG PO CAPS: 300.0000 mg | ORAL_CAPSULE | Freq: Three times a day (TID) | ORAL | 0 refills | Status: DC

## 2016-09-19 NOTE — ED Provider Notes (Signed)
[facility] Emergency Department Provider Note   ____________________________________________   First MD Initiated Contact with Patient 09/19/16 [phone]     (approximate)  I have reviewed the triage vital signs and the nursing notes.   HISTORY  Chief Complaint Abscess    HPI [name] is a 25 y.o. female who presents to the ED from home with a chief complaint of abscess.Patient reports recurrent abscesses since October 2017 when she got a tattoo on her right arm. Initially she went to urgent care and was placed on antibiotic. Subsequently she came to the emergency department was placed on clindamycin but only took 2 days because she lost her bottle of medicine. Reports worsening pain and swelling in bilateral axilla, right greater than left 1.5 weeks. Complains of generalized malaise. Denies associated fever, chills, chest pain, shortness of breath, abdominal pain, nausea, vomiting, diarrhea. Denies recent travel or trauma. Nothing makes her symptoms better or worse.   Past Medical History:  Diagnosis Date  . Abscess   . Bipolar disorder (HCC)   . Depression   . Genital herpes     Patient Active Problem List   Diagnosis Date Noted  . SOB (shortness of breath) 05/30/2014  . Other chest pain 05/30/2014  . Rapid heart beat 05/30/2014  . Panic attacks 05/30/2014  . Anxiety 05/30/2014    Past Surgical History:  Procedure Laterality Date  . MOUTH SURGERY      Prior to Admission medications   Medication Sig Start Date End Date Taking? Authorizing Provider  clindamycin (CLEOCIN) 300 MG capsule Take 1 capsule (300 mg total) by mouth 3 (three) times daily. 09/19/16   Irean Hong, MD  cyclobenzaprine (FLEXERIL) 10 MG tablet Take 1 tablet (10 mg total) by mouth 3 (three) times daily as needed for muscle spasms. 02/19/16   Delorise Royals Cuthriell, PA-C  DULoxetine (CYMBALTA) 20 MG capsule Take 30 mg by mouth daily.     Historical Provider, MD    HYDROcodone-acetaminophen (NORCO) 5-325 MG tablet Take 1 tablet by mouth every 6 (six) hours as needed for moderate pain. 09/19/16   Irean Hong, MD  ibuprofen (ADVIL,MOTRIN) 800 MG tablet Take 1 tablet (800 mg total) by mouth every 8 (eight) hours as needed for moderate pain. 09/19/16   Irean Hong, MD  meloxicam (MOBIC) 15 MG tablet Take 1 tablet (15 mg total) by mouth daily. 02/19/16   Delorise Royals Cuthriell, PA-C  naproxen (NAPROSYN) 500 MG tablet Take 1 tablet (500 mg total) by mouth 2 (two) times daily with a meal. 11/30/15   Delorise Royals Cuthriell, PA-C  norgestimate-ethinyl estradiol (SPRINTEC 28) 0.25-35 MG-MCG tablet Take 1 tablet by mouth at bedtime.    Historical Provider, MD  ondansetron (ZOFRAN ODT) 4 MG disintegrating tablet Take 1 tablet (4 mg total) by mouth every 8 (eight) hours as needed for nausea or vomiting. 11/24/15   Sharman Cheek, MD  QUEtiapine (SEROQUEL XR) 400 MG 24 hr tablet Take 400 mg by mouth at bedtime.    Historical Provider, MD    Allergies Nickel  Family History  Problem Relation Age of Onset  . Hyperlipidemia Mother   . Hypertension Mother   . Hyperlipidemia Father     Social History Social History  Substance Use Topics  . Smoking status: Never Smoker  . Smokeless tobacco: Never Used  . Alcohol use Yes     Comment: Rare.     Review of Systems  Constitutional: Positive for generalized malaise. No  fever/chills. Eyes: No visual changes. ENT: No sore throat. Cardiovascular: Denies chest pain. Respiratory: Denies shortness of breath. Gastrointestinal: No abdominal pain.  No nausea, no vomiting.  No diarrhea.  No constipation. Genitourinary: Negative for dysuria. Musculoskeletal: Negative for back pain. Skin: Positive for abscesses. Negative for rash. Neurological: Negative for headaches, focal weakness or numbness.  10-point ROS otherwise negative.  ____________________________________________   PHYSICAL EXAM:  VITAL SIGNS: ED Triage Vitals   Enc Vitals Group     BP 09/19/16 0014 128/82     Pulse Rate 09/19/16 0014 90     Resp 09/19/16 0014 20     Temp 09/19/16 0014 98 F (36.7 C)     Temp Source 09/19/16 0014 Oral     SpO2 09/19/16 0014 100 %     Weight 09/19/16 0014 205 lb (93 kg)     Height 09/19/16 0014 5\' 3"  (1.6 m)     Head Circumference --      Peak Flow --      Pain Score 09/19/16 0015 8     Pain Loc --      Pain Edu? --      Excl. in GC? --     Constitutional: Alert and oriented. Well appearing and in no acute distress. Eyes: Conjunctivae are normal. PERRL. EOMI. Head: Atraumatic. Nose: No congestion/rhinnorhea. Mouth/Throat: Mucous membranes are moist.  Oropharynx non-erythematous. Neck: No stridor.   Cardiovascular: Normal rate, regular rhythm. Grossly normal heart sounds.  Good peripheral circulation. Respiratory: Normal respiratory effort.  No retractions. Lungs CTAB. Gastrointestinal: Soft and nontender. No distention. No abdominal bruits. No CVA tenderness. Musculoskeletal: No lower extremity tenderness nor edema.  No joint effusions. Neurologic:  Normal speech and language. No gross focal neurologic deficits are appreciated. No gait instability. Skin:  Multiple small, draining abscesses to bilateral axilla. Also patchy areas of folliculitis. Skin is warm, dry and intact. No rash noted. Psychiatric: Mood and affect are normal. Speech and behavior are normal.  ____________________________________________   LABS (all labs ordered are listed, but only abnormal results are displayed)  Labs Reviewed - No data to display ____________________________________________  EKG  None ____________________________________________  RADIOLOGY  None ____________________________________________   PROCEDURES  Procedure(s) performed: None  Procedures  Critical Care performed: No  ____________________________________________   INITIAL IMPRESSION / ASSESSMENT AND PLAN / ED COURSE  Pertinent labs &  imaging results that were available during my care of the patient were reviewed by me and considered in my medical decision making (see chart for details).  25 year old female who presents with multiple draining tiny abscesses and patchy areas of folliculitis to both axilla; none requiring incision and drainage. Will place patient on clindamycin, analgesics, warm compresses and she will follow-up with her PCP next week. Strict return precautions given. Patient verbalizes understanding and agrees with plan of care.  Clinical Course      ____________________________________________   FINAL CLINICAL IMPRESSION(S) / ED DIAGNOSES  Final diagnoses:  Abscess      NEW MEDICATIONS STARTED DURING THIS VISIT:  New Prescriptions   CLINDAMYCIN (CLEOCIN) 300 MG CAPSULE    Take 1 capsule (300 mg total) by mouth 3 (three) times daily.   HYDROCODONE-ACETAMINOPHEN (NORCO) 5-325 MG TABLET    Take 1 tablet by mouth every 6 (six) hours as needed for moderate pain.   IBUPROFEN (ADVIL,MOTRIN) 800 MG TABLET    Take 1 tablet (800 mg total) by mouth every 8 (eight) hours as needed for moderate pain.     Note:  This  document was prepared using Conservation officer, historic buildingsDragon voice recognition software and may include unintentional dictation errors.    Irean HongJade J Sung, MD 09/19/16 [phone]

## 2016-09-19 NOTE — ED Notes (Signed)
Pt is in good condition; discharge instructions reviewed, follow up care and home care reviewed; prescription medication reviewed; pt verbalized understanding; pt was prescribed Norco prior to leaving, but since the pt is driving herself, pt was given the Norco to take home and take it once reaching home; pt is ambulatory and went home with a friend

## 2016-09-19 NOTE — Discharge Instructions (Signed)
1. Take antibiotic as prescribed (clindamycin 300 mg 3 times daily 10 days). 2. You may take ibuprofen and Norco as needed for pain. 3. Apply warm compress to affected area several times daily. 4. Return to the ER for worsening symptoms, persistent vomiting, fever or other concerns.

## 2016-09-19 NOTE — ED Triage Notes (Signed)
Pt has recurrent abscesses since Oct. 2017 when she got a tattoo. Pt states she lost her abx that were last rx'd and did not complete course. Pt states abscesses are in bilateral axilla. Pt states general feeling of illness, denies fever and is presently afebrile.

## 2016-10-04 ENCOUNTER — Encounter: Payer: Self-pay | Admitting: Emergency Medicine

## 2016-10-04 ENCOUNTER — Emergency Department
Admission: EM | Admit: 2016-10-04 | Discharge: 2016-10-04 | Disposition: A | Discharge status: Home / Self Care | Payer: Medicaid Other | Attending: Emergency Medicine | Admitting: Emergency Medicine

## 2016-10-04 DIAGNOSIS — R110 Nausea: Secondary | ICD-10-CM | POA: Insufficient documentation

## 2016-10-04 DIAGNOSIS — R45851 Suicidal ideations: Secondary | ICD-10-CM

## 2016-10-04 DIAGNOSIS — R630 Anorexia: Secondary | ICD-10-CM | POA: Insufficient documentation

## 2016-10-04 DIAGNOSIS — Z791 Long term (current) use of non-steroidal anti-inflammatories (NSAID): Secondary | ICD-10-CM | POA: Insufficient documentation

## 2016-10-04 DIAGNOSIS — Z79899 Other long term (current) drug therapy: Secondary | ICD-10-CM | POA: Diagnosis not present

## 2016-10-04 LAB — URINE DRUG SCREEN, QUALITATIVE (ARMC ONLY)
Amphetamines, Ur Screen: NOT DETECTED
BARBITURATES, UR SCREEN: NOT DETECTED
BENZODIAZEPINE, UR SCRN: NOT DETECTED
Cannabinoid 50 Ng, Ur ~~LOC~~: NOT DETECTED
Cocaine Metabolite,Ur ~~LOC~~: NOT DETECTED
MDMA (Ecstasy)Ur Screen: NOT DETECTED
METHADONE SCREEN, URINE: NOT DETECTED
Opiate, Ur Screen: NOT DETECTED
PHENCYCLIDINE (PCP) UR S: NOT DETECTED
Tricyclic, Ur Screen: POSITIVE — AB

## 2016-10-04 LAB — COMPREHENSIVE METABOLIC PANEL
ALBUMIN: 4.3 g/dL (ref 3.5–5.0)
ALK PHOS: 75 U/L (ref 38–126)
ALT: 26 U/L (ref 14–54)
AST: 23 U/L (ref 15–41)
Anion gap: 7 (ref 5–15)
BILIRUBIN TOTAL: 0.6 mg/dL (ref 0.3–1.2)
BUN: 8 mg/dL (ref 6–20)
CALCIUM: 9.4 mg/dL (ref 8.9–10.3)
CO2: 26 mmol/L (ref 22–32)
Chloride: 105 mmol/L (ref 101–111)
Creatinine, Ser: 0.84 mg/dL (ref 0.44–1.00)
GFR calc Af Amer: >60 mL/min (ref >60)
GLUCOSE: 88 mg/dL (ref 65–99)
Potassium: 3.8 mmol/L (ref 3.5–5.1)
Sodium: 138 mmol/L (ref 135–145)
TOTAL PROTEIN: 7.8 g/dL (ref 6.5–8.1)

## 2016-10-04 LAB — SALICYLATE LEVEL: Salicylate Lvl: <7 mg/dL (ref 2.8–30.0)

## 2016-10-04 LAB — CBC
HEMATOCRIT: 40.8 % (ref 35.0–47.0)
Hemoglobin: 14.3 g/dL (ref 12.0–16.0)
MCH: 30.7 pg (ref 26.0–34.0)
MCHC: 34.9 g/dL (ref 32.0–36.0)
MCV: 88 fL (ref 80.0–100.0)
Platelets: 342 10*3/uL (ref 150–440)
RBC: 4.64 MIL/uL (ref 3.80–5.20)
RDW: 13.2 % (ref 11.5–14.5)
WBC: 11.4 10*3/uL — ABNORMAL HIGH (ref 3.6–11.0)

## 2016-10-04 LAB — POC URINE PREG, ED: Preg Test, Ur: NEGATIVE

## 2016-10-04 LAB — ETHANOL: Alcohol, Ethyl (B): <5 mg/dL (ref <5)

## 2016-10-04 LAB — ACETAMINOPHEN LEVEL

## 2016-10-04 MED ORDER — ONDANSETRON HCL 4 MG/2ML IJ SOLN
4.0000 mg | Freq: Once | INTRAMUSCULAR | Status: AC
  Administered 2016-10-04: 4 mg via INTRAVENOUS
  Filled 2016-10-04: qty 2

## 2016-10-04 MED ORDER — SODIUM CHLORIDE 0.9 % IV BOLUS (SEPSIS)
1000.0000 mL | Freq: Once | INTRAVENOUS | Status: AC
  Administered 2016-10-04: 1000 mL via INTRAVENOUS

## 2016-10-04 NOTE — ED Triage Notes (Signed)
Does report abdominal pain but thinks because has not eaten in 2 days

## 2016-10-04 NOTE — ED Triage Notes (Signed)
Pt has belly button ring she reports cannot come out because new. Dorinda Hillonald Consulting civil engineercharge RN aware.

## 2016-10-04 NOTE — Discharge Instructions (Signed)

## 2016-10-04 NOTE — ED Notes (Signed)
ED Provider at bedside. 

## 2016-10-04 NOTE — ED Provider Notes (Signed)
[facility] Emergency Department Provider Note  ____________________________________________   First MD Initiated Contact with Patient 10/04/16 1737     (approximate)  I have reviewed the triage vital signs and the nursing notes.  HISTORY  Chief Complaint Suicidal  HPI [name] is a 25 y.o. female   Patient reports that she's been having situational problems with her mother at home, she's been recently having low mood and energy. She was considering driving her car off a bridge yesterday to harm herself. She reports she does not want to kill her self now, but yesterday she was having thoughts of definitely wanted to hurt herself. She denies any hallucinations. Denies wanting to harm anyone else. She has a family history of suicide  In addition, the patient reports that for the last 2 days she's been feeling decreased appetite and mild nausea. She wishes to have a pregnancy test checked. She denies having any pain but feels like she is just not very hungry, and slightly fatigued over the last day. No fevers or chills. No pain or burning with urination.   Past Medical History:  Diagnosis Date  . Abscess   . Bipolar disorder (HCC)   . Depression   . Genital herpes     Patient Active Problem List   Diagnosis Date Noted  . SOB (shortness of breath) 05/30/2014  . Other chest pain 05/30/2014  . Rapid heart beat 05/30/2014  . Panic attacks 05/30/2014  . Anxiety 05/30/2014    Past Surgical History:  Procedure Laterality Date  . MOUTH SURGERY      Prior to Admission medications   Medication Sig Start Date End Date Taking? Authorizing Provider  clindamycin (CLEOCIN) 300 MG capsule Take 1 capsule (300 mg total) by mouth 3 (three) times daily. 09/19/16   Irean Hong, MD  cyclobenzaprine (FLEXERIL) 10 MG tablet Take 1 tablet (10 mg total) by mouth 3 (three) times daily as needed for muscle spasms. 02/19/16   Delorise Royals Cuthriell, PA-C  DULoxetine  (CYMBALTA) 20 MG capsule Take 30 mg by mouth daily.     Historical Provider, MD  HYDROcodone-acetaminophen (NORCO) 5-325 MG tablet Take 1 tablet by mouth every 6 (six) hours as needed for moderate pain. 09/19/16   Irean Hong, MD  ibuprofen (ADVIL,MOTRIN) 800 MG tablet Take 1 tablet (800 mg total) by mouth every 8 (eight) hours as needed for moderate pain. 09/19/16   Irean Hong, MD  meloxicam (MOBIC) 15 MG tablet Take 1 tablet (15 mg total) by mouth daily. 02/19/16   Delorise Royals Cuthriell, PA-C  naproxen (NAPROSYN) 500 MG tablet Take 1 tablet (500 mg total) by mouth 2 (two) times daily with a meal. 11/30/15   Delorise Royals Cuthriell, PA-C  norgestimate-ethinyl estradiol (SPRINTEC 28) 0.25-35 MG-MCG tablet Take 1 tablet by mouth at bedtime.    Historical Provider, MD  ondansetron (ZOFRAN ODT) 4 MG disintegrating tablet Take 1 tablet (4 mg total) by mouth every 8 (eight) hours as needed for nausea or vomiting. 11/24/15   Sharman Cheek, MD  QUEtiapine (SEROQUEL XR) 400 MG 24 hr tablet Take 400 mg by mouth at bedtime.    Historical Provider, MD    Allergies Nickel  Family History  Problem Relation Age of Onset  . Hyperlipidemia Mother   . Hypertension Mother   . Hyperlipidemia Father     Social History Social History  Substance Use Topics  . Smoking status: Never Smoker  . Smokeless tobacco: Never Used  .  Alcohol use Yes     Comment: Rare.     Review of Systems Constitutional: No fever/chills Eyes: No visual changes. ENT: No sore throat. Cardiovascular: Denies chest pain. Respiratory: Denies shortness of breath. Gastrointestinal: No abdominal pain.  No vomiting.  No diarrhea.  No constipation. Genitourinary: Negative for dysuria. Musculoskeletal: Negative for back pain. Skin: Negative for rash. Neurological: Negative for headaches, focal weakness or numbness.  10-point ROS otherwise negative.  ____________________________________________   PHYSICAL EXAM:  VITAL SIGNS: ED Triage  Vitals  Enc Vitals Group     BP 10/04/16 1622 129/85     Pulse Rate 10/04/16 1622 88     Resp 10/04/16 1622 18     Temp 10/04/16 1622 98.4 F (36.9 C)     Temp Source 10/04/16 1622 Oral     SpO2 10/04/16 1622 98 %     Weight 10/04/16 1619 205 lb (93 kg)     Height 10/04/16 1619 5\' 3"  (1.6 m)     Head Circumference --      Peak Flow --      Pain Score 10/04/16 1619 4     Pain Loc --      Pain Edu? --      Excl. in GC? --     Constitutional: Alert and oriented. Well appearing and in no acute distress.Somewhat flat in her mood and affect. There is just slightly fatigued. Able to sit up well and interact. Eyes: Conjunctivae are normal. PERRL. EOMI. Head: Atraumatic. Nose: No congestion/rhinnorhea. Mouth/Throat: Mucous membranes are moist.  Oropharynx non-erythematous. Neck: No stridor.   Cardiovascular: Normal rate, regular rhythm. Grossly normal heart sounds.  Good peripheral circulation. Respiratory: Normal respiratory effort.  No retractions. Lungs CTAB. Gastrointestinal: Soft and nontender. No distention. Negative Murphy. No rebound or guarding. Musculoskeletal: No lower extremity tenderness nor edema.  No joint effusions. Neurologic:  Normal speech and language. No gross focal neurologic deficits are appreciated.  Skin:  Skin is warm, dry and intact. No rash noted. Psychiatric: Mood and affect are normal. Speech and behavior are normal.  ____________________________________________   LABS (all labs ordered are listed, but only abnormal results are displayed)  Labs Reviewed  ACETAMINOPHEN LEVEL - Abnormal; Notable for the following:       Result Value   Acetaminophen (Tylenol), Serum <10 (*)    All other components within normal limits  CBC - Abnormal; Notable for the following:    WBC 11.4 (*)    All other components within normal limits  URINE DRUG SCREEN, QUALITATIVE ([facility] ONLY) - Abnormal; Notable for the following:    Tricyclic, Ur Screen POSITIVE (*)    All other  components within normal limits  COMPREHENSIVE METABOLIC PANEL  ETHANOL  SALICYLATE LEVEL  POC URINE PREG, ED   ____________________________________________  EKG   ____________________________________________  RADIOLOGY   ____________________________________________   PROCEDURES  Procedure(s) performed: None  Procedures  Critical Care performed: No  ____________________________________________   INITIAL IMPRESSION / ASSESSMENT AND PLAN / ED COURSE  Pertinent labs & imaging results that were available during my care of the patient were reviewed by me and considered in my medical decision making (see chart for details).  1) suicidal ideation yesterday. She reports this is no longer the case to me, she tells me that she does not want to harm herself today but did yesterday. At present I will leave her voluntary, and have placed a consult to psychiatry for further recommendations and treatment.  2) decreased appetite and nausea.  Quite possibly connected to her psychiatric disease, lab work very reassuring and has no abdominal pain on exam at this time. I will give her a liter of fluid for hydration as she reports not eating well last 2 days and feeling fatigued, also trial Zofran to see if this improves her appetite. At present time no evidence of acute intra-abdominal etiology is noted.  Clinical Course      ____________________________________________   FINAL CLINICAL IMPRESSION(S) / ED DIAGNOSES  Final diagnoses:  Suicidal thoughts  Nausea in adult   Discussed case with Dr. Harvie JuniorHowarth, of psychiatry.He is recommending discharge with outpatient follow-up and feels the patient is no significant risk for self-harm. I would agree, the patient also told me that she is no longer having feelings of suicidal ideation and she has appropriate outpatient follow-up. Continue her current medications. Also advised careful return precautions regarding her nausea, but after IV  hydration she reports feeling improved with no distress. ----------------------------------------- 8:43 PM on 10/04/2016 -----------------------------------------    NEW MEDICATIONS STARTED DURING THIS VISIT:  New Prescriptions   No medications on file     Note:  This document was prepared using Dragon voice recognition software and may include unintentional dictation errors.     Sharyn CreamerMark Ericha Whittingham, MD 10/04/16 2044

## 2016-10-04 NOTE — ED Triage Notes (Signed)
Reports has always had depression but over last 2 days had wanted to kill herself with plan of driving car off bridge. Cousin and uncle have killed themselves in past and this past October her daughters father killed himself.  Poor eye contact. Denies HI. Denies auditory/visual hallucinations.

## 2016-10-04 NOTE — ED Triage Notes (Signed)
Last period 08/23/16. Pt does not think she is pregnant but cannot be sure.

## 2016-10-04 NOTE — ED Notes (Addendum)
Pt property locked up by security. Yellow sheet placed in pyxis with key #14.

## 2016-10-13 ENCOUNTER — Encounter (HOSPITAL_COMMUNITY): Payer: Self-pay | Admitting: Emergency Medicine

## 2016-10-13 ENCOUNTER — Emergency Department (HOSPITAL_COMMUNITY)
Admission: EM | Admit: 2016-10-13 | Discharge: 2016-10-13 | Disposition: A | Discharge status: Home / Self Care | Payer: Medicaid Other | Attending: Emergency Medicine | Admitting: Emergency Medicine

## 2016-10-13 DIAGNOSIS — F19239 Other psychoactive substance dependence with withdrawal, unspecified: Secondary | ICD-10-CM | POA: Diagnosis present

## 2016-10-13 DIAGNOSIS — Z791 Long term (current) use of non-steroidal anti-inflammatories (NSAID): Secondary | ICD-10-CM | POA: Diagnosis not present

## 2016-10-13 DIAGNOSIS — Z79899 Other long term (current) drug therapy: Secondary | ICD-10-CM | POA: Diagnosis not present

## 2016-10-13 DIAGNOSIS — R51 Headache: Secondary | ICD-10-CM | POA: Insufficient documentation

## 2016-10-13 DIAGNOSIS — R110 Nausea: Secondary | ICD-10-CM | POA: Insufficient documentation

## 2016-10-13 DIAGNOSIS — R519 Headache, unspecified: Secondary | ICD-10-CM

## 2016-10-13 LAB — COMPREHENSIVE METABOLIC PANEL
ALK PHOS: 75 U/L (ref 38–126)
ALT: 26 U/L (ref 14–54)
ANION GAP: 7 (ref 5–15)
AST: 20 U/L (ref 15–41)
Albumin: 4 g/dL (ref 3.5–5.0)
BILIRUBIN TOTAL: 0.8 mg/dL (ref 0.3–1.2)
BUN: 7 mg/dL (ref 6–20)
CALCIUM: 8.7 mg/dL — AB (ref 8.9–10.3)
CO2: 25 mmol/L (ref 22–32)
CREATININE: 0.91 mg/dL (ref 0.44–1.00)
Chloride: 102 mmol/L (ref 101–111)
Glucose, Bld: 86 mg/dL (ref 65–99)
Potassium: 3.5 mmol/L (ref 3.5–5.1)
Sodium: 134 mmol/L — ABNORMAL LOW (ref 135–145)
Total Protein: 7.6 g/dL (ref 6.5–8.1)

## 2016-10-13 LAB — CBC WITH DIFFERENTIAL/PLATELET
Basophils Absolute: 0 10*3/uL (ref 0.0–0.1)
Basophils Relative: 0 %
EOS ABS: 0.1 10*3/uL (ref 0.0–0.7)
Eosinophils Relative: 2 %
HEMATOCRIT: 42.3 % (ref 36.0–46.0)
HEMOGLOBIN: 14 g/dL (ref 12.0–15.0)
LYMPHS ABS: 2.4 10*3/uL (ref 0.7–4.0)
Lymphocytes Relative: 25 %
MCH: 30 pg (ref 26.0–34.0)
MCHC: 33.1 g/dL (ref 30.0–36.0)
MCV: 90.8 fL (ref 78.0–100.0)
MONOS PCT: 8 %
Monocytes Absolute: 0.8 10*3/uL (ref 0.1–1.0)
NEUTROS PCT: 65 %
Neutro Abs: 6.2 10*3/uL (ref 1.7–7.7)
Platelets: 350 10*3/uL (ref 150–400)
RBC: 4.66 MIL/uL (ref 3.87–5.11)
RDW: 12.8 % (ref 11.5–15.5)
WBC: 9.6 10*3/uL (ref 4.0–10.5)

## 2016-10-13 LAB — RAPID URINE DRUG SCREEN, HOSP PERFORMED
Amphetamines: NOT DETECTED
Barbiturates: NOT DETECTED
Benzodiazepines: NOT DETECTED
COCAINE: NOT DETECTED
OPIATES: NOT DETECTED
TETRAHYDROCANNABINOL: NOT DETECTED

## 2016-10-13 LAB — LIPASE, BLOOD: LIPASE: 16 U/L (ref 11–51)

## 2016-10-13 LAB — PREGNANCY, URINE: Preg Test, Ur: NEGATIVE

## 2016-10-13 MED ORDER — DULOXETINE HCL 30 MG PO CPEP: 90.0000 mg | ORAL_CAPSULE | Freq: Every day | ORAL | 0 refills | Status: DC

## 2016-10-13 MED ORDER — ONDANSETRON 4 MG PO TBDP
4.0000 mg | ORAL_TABLET | Freq: Once | ORAL | Status: AC
  Administered 2016-10-13: 4 mg via ORAL
  Filled 2016-10-13: qty 1

## 2016-10-13 MED ORDER — QUETIAPINE FUMARATE ER 400 MG PO TB24: 400.0000 mg | ORAL_TABLET | Freq: Every day | ORAL | 0 refills | Status: DC

## 2016-10-13 NOTE — ED Notes (Signed)
MD at the bedside  

## 2016-10-13 NOTE — ED Provider Notes (Signed)
[facility]-EMERGENCY DEPT Provider Note   CSN: [id number] Arrival date & time: 10/13/16  1335     History   Chief Complaint Chief Complaint  Patient presents with  . Withdrawal    HPI [name] is a 25 y.o. female.  The history is provided by the patient.   CC: medication withdrawal (seroquel and cymbalta)  Onset/Duration: 3 days Timing: constant Quality: chills, nausea, headache Severity: moderated Modifying Factors:  Improved by: nothing tried   Worsened by: nothing Associated Signs/Symptoms:  Pertinent (+): none  Pertinent (-): fever, vomiting, diarrhea, abd pain. SI, HI, AVH. Context: out of her meds due to holiday break. She was not able to see her MD for refills.   LMP 09/02/16. Last UPT negative on 10/09/16.    Past Medical History:  Diagnosis Date  . Abscess   . Bipolar disorder (HCC)   . Depression   . Genital herpes     Patient Active Problem List   Diagnosis Date Noted  . SOB (shortness of breath) 05/30/2014  . Other chest pain 05/30/2014  . Rapid heart beat 05/30/2014  . Panic attacks 05/30/2014  . Anxiety 05/30/2014    Past Surgical History:  Procedure Laterality Date  . MOUTH SURGERY      OB History    Gravida Para Term Preterm AB Living   1 1 1          SAB TAB Ectopic Multiple Live Births                   Home Medications    Prior to Admission medications   Medication Sig Start Date End Date Taking? Authorizing Provider  HYDROcodone-acetaminophen (NORCO) 5-325 MG tablet Take 1 tablet by mouth every 6 (six) hours as needed for moderate pain. 09/19/16  Yes Irean HongJade J Sung, MD  ibuprofen (ADVIL,MOTRIN) 800 MG tablet Take 1 tablet (800 mg total) by mouth every 8 (eight) hours as needed for moderate pain. 09/19/16  Yes Irean HongJade J Sung, MD  clindamycin (CLEOCIN) 300 MG capsule Take 1 capsule (300 mg total) by mouth 3 (three) times daily. Patient not taking: Reported on 10/13/2016 09/19/16   Irean HongJade J Sung, MD  DULoxetine (CYMBALTA) 30 MG  capsule Take 3 capsules (90 mg total) by mouth at bedtime. 10/13/16 10/20/16  Nira ConnPedro Eduardo Cardama, MD  QUEtiapine (SEROQUEL XR) 400 MG 24 hr tablet Take 1 tablet (400 mg total) by mouth at bedtime. 10/13/16 10/20/16  Nira ConnPedro Eduardo Cardama, MD    Family History Family History  Problem Relation Age of Onset  . Hyperlipidemia Mother   . Hypertension Mother   . Hyperlipidemia Father     Social History Social History  Substance Use Topics  . Smoking status: Never Smoker  . Smokeless tobacco: Never Used  . Alcohol use Yes     Comment: Rare.      Allergies   Nickel   Review of Systems Review of Systems Ten systems are reviewed and are negative for acute change except as noted in the HPI   Physical Exam Updated Vital Signs BP 97/67   Pulse 85   Temp 99.1 F (37.3 C) (Oral)   Resp 22   LMP 08/23/2016   SpO2 99%   Physical Exam  Constitutional: She is oriented to person, place, and time. She appears well-developed and well-nourished. No distress.  HENT:  Head: Normocephalic and atraumatic.  Nose: Nose normal.  Eyes: Conjunctivae and EOM are normal. Pupils are equal, round, and reactive to light. Right eye  exhibits no discharge. Left eye exhibits no discharge. No scleral icterus.  Fundoscopic exam:      The right eye shows no hemorrhage and no papilledema.       The left eye shows no hemorrhage and no papilledema.  Neck: Normal range of motion. Neck supple.  Cardiovascular: Normal rate and regular rhythm.  Exam reveals no gallop and no friction rub.   No murmur heard. Pulmonary/Chest: Effort normal and breath sounds normal. No stridor. No respiratory distress. She has no rales.  Abdominal: Soft. She exhibits no distension. There is tenderness in the epigastric area.  Musculoskeletal: She exhibits no edema or tenderness.  Neurological: She is alert and oriented to person, place, and time.  Mental Status: Alert and oriented to person, place, and time. Attention and  concentration normal. Speech clear. Recent memory is intac  Cranial Nerves  II Visual Fields: Intact to confrontation. Visual fields intact. III, IV, VI: mydriasis.  Pupils equal and reactive to light and near. Full eye movement without nystagmus  V Facial Sensation: Normal. No weakness of masticatory muscles  VII: No facial weakness or asymmetry  VIII Auditory Acuity: Grossly normal  IX/X: The uvula is midline; the palate elevates symmetrically  XI: Normal sternocleidomastoid and trapezius strength  XII: The tongue is midline. No atrophy or fasciculations.   Motor System: Muscle Strength: 5/5 and symmetric in the upper and lower extremities. No pronation or drift.  Muscle Tone: Tone and muscle bulk are normal in the upper and lower extremities.   Reflexes: DTRs: 2+ and symmetrical in all four extremities. Plantar responses are flexor bilaterally.  Coordination: Intact finger-to-nose and rapid alternating movements. No tremor.  Sensation: Intact to light touch, and pinprick. Gait: Routine and tandem gait are normal    Skin: Skin is warm and dry. No rash noted. She is not diaphoretic. No erythema.  Psychiatric: She has a normal mood and affect.  Vitals reviewed.    ED Treatments / Results  Labs (all labs ordered are listed, but only abnormal results are displayed) Labs Reviewed  COMPREHENSIVE METABOLIC PANEL - Abnormal; Notable for the following:       Result Value   Sodium 134 (*)    Calcium 8.7 (*)    All other components within normal limits  RAPID URINE DRUG SCREEN, HOSP PERFORMED  PREGNANCY, URINE  CBC WITH DIFFERENTIAL/PLATELET  LIPASE, BLOOD    EKG  EKG Interpretation None       Radiology No results found.  Procedures Procedures (including critical care time)  Medications Ordered in ED Medications  ondansetron (ZOFRAN-ODT) disintegrating tablet 4 mg (4 mg Oral Given 10/13/16 1911)     Initial Impression / Assessment and Plan / ED Course  I have  reviewed the triage vital signs and the nursing notes.  Pertinent labs & imaging results that were available during my care of the patient were reviewed by me and considered in my medical decision making (see chart for details).  Clinical Course     Screening labs obtained and grossly reassuring. UDS negative. UPT negative. Presentation is consistent with withdrawal from Cymbalta and Seroquel. Patient provided with Zofran which provided significant relief to her symptom pathology.  We'll provide one week refill for patient's Cymbalta and Seroquel. Instructed to follow up closely with her primary care provider for additional refill.   Final Clinical Impressions(s) / ED Diagnoses   Final diagnoses:  Nausea  Acute nonintractable headache, unspecified headache type   Disposition: Discharge  Condition: Good  I have  discussed the results, Dx and Tx plan with the patient who expressed understanding and agree(s) with the plan. Discharge instructions discussed at great length. The patient was given strict return precautions who verbalized understanding of the instructions. No further questions at time of discharge.    Discharge Medication List as of 10/13/2016  9:49 PM      Follow Up: [facility] [address] [place] [address] [phone]  Schedule an appointment as soon as possible for a visit  For close follow up to assess for refills on your medication      Nira Conn, MD 10/13/16 2217

## 2016-10-13 NOTE — ED Notes (Signed)
Pt given a cup of water to drink. 

## 2016-10-13 NOTE — ED Triage Notes (Signed)
Pt states she has not had Cymbalta or Seroquel in 3 days, was not able to get the filled, symptoms, nausea, headache, dizzy, chills, depressed, tearful. Denies SI

## 2016-10-13 NOTE — ED Triage Notes (Signed)
Pt reports withdrawal from Seroquel. Pt states she ran out of the medication and has not been able to get in to see a doctor.

## 2016-10-14 ENCOUNTER — Emergency Department: Payer: Medicaid Other

## 2016-10-14 ENCOUNTER — Emergency Department
Admission: EM | Admit: 2016-10-14 | Discharge: 2016-10-14 | Disposition: A | Discharge status: Home / Self Care | Payer: Medicaid Other | Attending: Emergency Medicine | Admitting: Emergency Medicine

## 2016-10-14 ENCOUNTER — Encounter: Payer: Self-pay | Admitting: Emergency Medicine

## 2016-10-14 DIAGNOSIS — Y9389 Activity, other specified: Secondary | ICD-10-CM | POA: Insufficient documentation

## 2016-10-14 DIAGNOSIS — Z79899 Other long term (current) drug therapy: Secondary | ICD-10-CM | POA: Insufficient documentation

## 2016-10-14 DIAGNOSIS — Y999 Unspecified external cause status: Secondary | ICD-10-CM | POA: Diagnosis not present

## 2016-10-14 DIAGNOSIS — Y92410 Unspecified street and highway as the place of occurrence of the external cause: Secondary | ICD-10-CM | POA: Diagnosis not present

## 2016-10-14 DIAGNOSIS — M542 Cervicalgia: Secondary | ICD-10-CM | POA: Diagnosis not present

## 2016-10-14 DIAGNOSIS — M545 Low back pain: Secondary | ICD-10-CM | POA: Diagnosis not present

## 2016-10-14 DIAGNOSIS — S3992XA Unspecified injury of lower back, initial encounter: Secondary | ICD-10-CM | POA: Diagnosis present

## 2016-10-14 MED ORDER — MELOXICAM 7.5 MG PO TABS: 7.5000 mg | ORAL_TABLET | Freq: Every day | ORAL | 2 refills | Status: DC

## 2016-10-14 MED ORDER — KETOROLAC TROMETHAMINE 60 MG/2ML IM SOLN
60.0000 mg | Freq: Once | INTRAMUSCULAR | Status: AC
  Administered 2016-10-14: 60 mg via INTRAMUSCULAR
  Filled 2016-10-14: qty 2

## 2016-10-14 MED ORDER — CYCLOBENZAPRINE HCL 5 MG PO TABS: 5.0000 mg | ORAL_TABLET | Freq: Three times a day (TID) | ORAL | 0 refills | Status: AC | PRN
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
## 2016-10-19 HISTORY — PX: CERVICAL DISC SURGERY: SHX588

## 2016-11-08 ENCOUNTER — Emergency Department: Payer: Medicaid Other

## 2016-11-08 ENCOUNTER — Emergency Department
Admission: EM | Admit: 2016-11-08 | Discharge: 2016-11-08 | Disposition: A | Discharge status: Home / Self Care | Payer: Medicaid Other | Attending: Emergency Medicine | Admitting: Emergency Medicine

## 2016-11-08 ENCOUNTER — Encounter: Payer: Self-pay | Admitting: Emergency Medicine

## 2016-11-08 DIAGNOSIS — Y999 Unspecified external cause status: Secondary | ICD-10-CM | POA: Insufficient documentation

## 2016-11-08 DIAGNOSIS — S3992XA Unspecified injury of lower back, initial encounter: Secondary | ICD-10-CM | POA: Diagnosis present

## 2016-11-08 DIAGNOSIS — Y92410 Unspecified street and highway as the place of occurrence of the external cause: Secondary | ICD-10-CM | POA: Insufficient documentation

## 2016-11-08 DIAGNOSIS — M545 Low back pain, unspecified: Secondary | ICD-10-CM

## 2016-11-08 DIAGNOSIS — Y939 Activity, unspecified: Secondary | ICD-10-CM | POA: Insufficient documentation

## 2016-11-08 LAB — POCT PREGNANCY, URINE: Preg Test, Ur: NEGATIVE

## 2016-11-08 MED ORDER — DIFLUNISAL 500 MG PO TABS: 500.0000 mg | ORAL_TABLET | Freq: Two times a day (BID) | ORAL | 0 refills | Status: DC

## 2016-11-08 NOTE — Discharge Instructions (Signed)
Call Monday to make an appointment to be seen by your primary care doctor sometime this week. Begin taking Dolobid twice a day with food. Use ice or heat to your low back as needed for comfort. You may also continue to make an appointment Dr. Martha ClanKrasinski for your back pain as instructed on your last visit.
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
## 2016-11-08 NOTE — ED Notes (Signed)
Urine pregnancy test NEGATIVE

## 2017-01-17 ENCOUNTER — Emergency Department
Admission: EM | Admit: 2017-01-17 | Discharge: 2017-01-17 | Disposition: A | Discharge status: Home / Self Care | Payer: Self-pay | Attending: Emergency Medicine | Admitting: Emergency Medicine

## 2017-01-17 ENCOUNTER — Emergency Department: Payer: Self-pay

## 2017-01-17 ENCOUNTER — Encounter: Payer: Self-pay | Admitting: Emergency Medicine

## 2017-01-17 DIAGNOSIS — R0789 Other chest pain: Secondary | ICD-10-CM | POA: Insufficient documentation

## 2017-01-17 DIAGNOSIS — R079 Chest pain, unspecified: Secondary | ICD-10-CM

## 2017-01-17 DIAGNOSIS — R110 Nausea: Secondary | ICD-10-CM | POA: Insufficient documentation

## 2017-01-17 DIAGNOSIS — Z79899 Other long term (current) drug therapy: Secondary | ICD-10-CM | POA: Insufficient documentation

## 2017-01-17 DIAGNOSIS — R42 Dizziness and giddiness: Secondary | ICD-10-CM | POA: Insufficient documentation

## 2017-01-17 LAB — BASIC METABOLIC PANEL
ANION GAP: 7 (ref 5–15)
BUN: 9 mg/dL (ref 6–20)
CALCIUM: 8.9 mg/dL (ref 8.9–10.3)
CO2: 26 mmol/L (ref 22–32)
Chloride: 104 mmol/L (ref 101–111)
Creatinine, Ser: 1.06 mg/dL — ABNORMAL HIGH (ref 0.44–1.00)
GFR calc Af Amer: >60 mL/min (ref >60)
GLUCOSE: 104 mg/dL — AB (ref 65–99)
Potassium: 3.9 mmol/L (ref 3.5–5.1)
Sodium: 137 mmol/L (ref 135–145)

## 2017-01-17 LAB — CBC
HEMATOCRIT: 39.7 % (ref 35.0–47.0)
HEMOGLOBIN: 13.5 g/dL (ref 12.0–16.0)
MCH: 30.3 pg (ref 26.0–34.0)
MCHC: 34 g/dL (ref 32.0–36.0)
MCV: 89 fL (ref 80.0–100.0)
Platelets: 291 10*3/uL (ref 150–440)
RBC: 4.46 MIL/uL (ref 3.80–5.20)
RDW: 13.5 % (ref 11.5–14.5)
WBC: 9.2 10*3/uL (ref 3.6–11.0)

## 2017-01-17 LAB — TROPONIN I

## 2017-01-17 MED ORDER — SODIUM CHLORIDE 0.9 % IV BOLUS (SEPSIS)
1000.0000 mL | Freq: Once | INTRAVENOUS | Status: AC
  Administered 2017-01-17: 1000 mL via INTRAVENOUS

## 2017-01-17 NOTE — Discharge Instructions (Signed)
You have been seen in the emergency department today for chest pain. Your workup has shown normal results. As we discussed please follow-up with your primary care physician in the next 1-2 days for recheck. Return to the emergency department for any further chest pain, trouble breathing, or any other symptom personally concerning to yourself. °

## 2017-01-17 NOTE — ED Provider Notes (Signed)
[facility] Emergency Department Provider Note  Time seen: 1:49 PM  I have reviewed the triage vital signs and the nursing notes.   HISTORY  Chief Complaint Dizziness; Nausea; and Chest Pain    HPI [name] is a 26 y.o. female with a past medical history of bipolar, anxiety, presents to the emergency department for chest pain and nausea. According to the patient since last night she has been feeling nauseated. While she was at work today approximately 2 hours ago she began feeling chest discomfort and a feeling like she might pass out. Patient continues to feel chest discomfort currently. Patient states a history of anxiety with similar symptoms in the past. Denies any vomiting, diarrhea, fever, dysuria. Her last menstrual period was 2 weeks ago. Describes her symptoms as mild currently.  Past Medical History:  Diagnosis Date  . Abscess   . Bipolar disorder (HCC)   . Depression   . Genital herpes     Patient Active Problem List   Diagnosis Date Noted  . SOB (shortness of breath) 05/30/2014  . Other chest pain 05/30/2014  . Rapid heart beat 05/30/2014  . Panic attacks 05/30/2014  . Anxiety 05/30/2014    Past Surgical History:  Procedure Laterality Date  . MOUTH SURGERY      Prior to Admission medications   Medication Sig Start Date End Date Taking? Authorizing Provider  diflunisal (DOLOBID) 500 MG TABS tablet Take 1 tablet (500 mg total) by mouth 2 (two) times daily. 11/08/16   Tommi Rumps, PA-C  DULoxetine (CYMBALTA) 30 MG capsule Take 3 capsules (90 mg total) by mouth at bedtime. 10/13/16 10/20/16  Nira Conn, MD  QUEtiapine (SEROQUEL XR) 400 MG 24 hr tablet Take 1 tablet (400 mg total) by mouth at bedtime. 10/13/16 10/20/16  Nira Conn, MD    Allergies  Allergen Reactions  . Nickel Rash    Family History  Problem Relation Age of Onset  . Hyperlipidemia Mother   . Hypertension Mother   . Hyperlipidemia Father      Social History Social History  Substance Use Topics  . Smoking status: Never Smoker  . Smokeless tobacco: Never Used  . Alcohol use Yes     Comment: Rare.     Review of Systems Constitutional: Negative for fever Cardiovascular:Positive for left-sided chest pain. Respiratory: Negative for shortness of breath. Gastrointestinal: Negative for abdominal pain Genitourinary: Negative for dysuria.Last period 2 weeks ago. Neurological: Negative for headache 10-point ROS otherwise negative.  ____________________________________________   PHYSICAL EXAM:  VITAL SIGNS: ED Triage Vitals [01/17/17 1344]  Enc Vitals Group     BP 98/62     Pulse Rate 100     Resp 18     Temp 98.7 F (37.1 C)     Temp Source Oral     SpO2 100 %     Weight 199 lb (90.3 kg)     Height  (1.6 m)     Head Circumference      Peak Flow      Pain Score 8     Pain Loc      Pain Edu?      Excl. in GC?     Constitutional: Alert and oriented. Well appearing and in no distress. Eyes: Normal exam ENT   Head: Normocephalic and atraumatic.   Mouth/Throat: Mucous membranes are moist. Cardiovascular: Normal rate, regular rhythm. No murmur Respiratory: Normal respiratory effort without tachypnea nor retractions. Breath sounds are clear  Gastrointestinal: Soft and nontender. No distention.   Musculoskeletal: Nontender with normal range of motion in all extremities.  Neurologic:  Normal speech and language. No gross focal neurologic deficits  Skin:  Skin is warm, dry and intact.  Psychiatric: Mood and affect are normal.  ____________________________________________    EKG  EKG reviewed and interpreted by myself shows sinus tachycardia 102 bpm, narrow QRS, normal axis, normal intervals, nonspecific but no concerning ST changes.  ____________________________________________   INITIAL IMPRESSION / ASSESSMENT AND PLAN / ED COURSE  Pertinent labs & imaging results that were available during my  care of the patient were reviewed by me and considered in my medical decision making (see chart for details).  Patient because the emergency department with chest pain and feeling dizzy. According to the patient she has been nauseated since last night, today at work developed chest discomfort and a feeling like she might pass out so she came to the emergency department. States a history of similar symptoms in the past due to her anxiety. We will check an EKG, labs, IV hydrate and closely monitor.  ____________________________________________   FINAL CLINICAL IMPRESSION(S) / ED DIAGNOSES  Dizziness Chest pain    Minna Antis, MD 01/17/17 [phone]

## 2017-01-17 NOTE — ED Triage Notes (Signed)
Pt arrived via EMS from work at [employer], pt states she started having nausea yesterday and today dizziness and chest pain. Pt reports chest pain on inspiration and is generalized. Describes the pain as the stabbing.  Denies any vomiting or diarrhea

## 2017-02-23 ENCOUNTER — Emergency Department: Payer: Medicaid Other

## 2017-02-23 ENCOUNTER — Encounter: Payer: Self-pay | Admitting: Emergency Medicine

## 2017-02-23 DIAGNOSIS — Y929 Unspecified place or not applicable: Secondary | ICD-10-CM | POA: Insufficient documentation

## 2017-02-23 DIAGNOSIS — W19XXXA Unspecified fall, initial encounter: Secondary | ICD-10-CM | POA: Insufficient documentation

## 2017-02-23 DIAGNOSIS — Y999 Unspecified external cause status: Secondary | ICD-10-CM | POA: Insufficient documentation

## 2017-02-23 DIAGNOSIS — Y939 Activity, unspecified: Secondary | ICD-10-CM | POA: Insufficient documentation

## 2017-02-23 DIAGNOSIS — M50222 Other cervical disc displacement at C5-C6 level: Secondary | ICD-10-CM | POA: Insufficient documentation
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
## 2017-02-24 ENCOUNTER — Emergency Department: Payer: Medicaid Other

## 2017-02-24 ENCOUNTER — Emergency Department
Admission: EM | Admit: 2017-02-24 | Discharge: 2017-02-24 | Disposition: A | Discharge status: Home / Self Care | Payer: Medicaid Other | Attending: Emergency Medicine | Admitting: Emergency Medicine

## 2017-02-24 DIAGNOSIS — M5020 Other cervical disc displacement, unspecified cervical region: Secondary | ICD-10-CM

## 2017-02-24 LAB — POCT PREGNANCY, URINE: PREG TEST UR: NEGATIVE

## 2017-02-24 MED ORDER — DEXAMETHASONE SODIUM PHOSPHATE 10 MG/ML IJ SOLN
10.0000 mg | Freq: Once | INTRAMUSCULAR | Status: AC
  Administered 2017-02-24: 10 mg via INTRAVENOUS
  Filled 2017-02-24: qty 1

## 2017-02-24 MED ORDER — KETOROLAC TROMETHAMINE 30 MG/ML IJ SOLN
30.0000 mg | Freq: Once | INTRAMUSCULAR | Status: AC
  Administered 2017-02-24: 30 mg via INTRAVENOUS
  Filled 2017-02-24: qty 1

## 2017-02-24 MED ORDER — PREDNISONE 20 MG PO TABS: 60.0000 mg | ORAL_TABLET | Freq: Every day | ORAL | 0 refills | Status: AC

## 2017-02-24 MED ORDER — OXYCODONE-ACETAMINOPHEN 5-325 MG PO TABS: 1.0000 | ORAL_TABLET | ORAL | 0 refills | Status: DC | PRN
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
## 2017-02-26 ENCOUNTER — Other Ambulatory Visit: Payer: Self-pay | Admitting: Neurosurgery

## 2017-02-26 DIAGNOSIS — M5090 Cervical disc disorder, unspecified, unspecified cervical region: Secondary | ICD-10-CM

## 2017-03-10 ENCOUNTER — Ambulatory Visit
Admission: RE | Admit: 2017-03-10 | Discharge: 2017-03-10 | Disposition: A | Discharge status: Home / Self Care | Payer: Self-pay | Source: Ambulatory Visit | Attending: Neurosurgery | Admitting: Neurosurgery

## 2017-03-10 DIAGNOSIS — M5090 Cervical disc disorder, unspecified, unspecified cervical region: Secondary | ICD-10-CM | POA: Insufficient documentation

## 2017-03-10 DIAGNOSIS — M50222 Other cervical disc displacement at C5-C6 level: Secondary | ICD-10-CM | POA: Insufficient documentation

## 2017-04-22 ENCOUNTER — Emergency Department
Admission: EM | Admit: 2017-04-22 | Discharge: 2017-04-22 | Disposition: A | Discharge status: Home / Self Care | Payer: Medicaid Other | Attending: Student in an Organized Health Care Education/Training Program | Admitting: Student in an Organized Health Care Education/Training Program

## 2017-04-22 DIAGNOSIS — M542 Cervicalgia: Secondary | ICD-10-CM | POA: Diagnosis present

## 2017-04-22 DIAGNOSIS — M5412 Radiculopathy, cervical region: Secondary | ICD-10-CM | POA: Diagnosis not present

## 2017-04-22 DIAGNOSIS — Z79899 Other long term (current) drug therapy: Secondary | ICD-10-CM | POA: Diagnosis not present

## 2017-04-22 MED ORDER — METHYLPREDNISOLONE SODIUM SUCC 125 MG IJ SOLR
125.0000 mg | Freq: Once | INTRAMUSCULAR | Status: AC
  Administered 2017-04-22: 125 mg via INTRAMUSCULAR
  Filled 2017-04-22: qty 2

## 2017-04-22 MED ORDER — PREDNISONE 50 MG PO TABS: ORAL_TABLET | ORAL | 0 refills | Status: DC

## 2017-04-22 NOTE — ED Notes (Signed)
Pt c/o neck pain, scheduled hernia disc surgery on 7/20 but unable to control pain at home. Pt c/o pain shooting down spine and RT shoulder. Pt ambulatory , NAD noted

## 2017-04-22 NOTE — ED Triage Notes (Signed)
Per pt she has a herniated disc that was diagnosed 1 month ago.  Pt has been following up with Dr. Adriana Simasook and has surgery scheduled for the end of this month.  Pt states that over the past 2 days that her neck pain has gotten worse and that she has been having increased numbness and tingling shooting down her right side from the neck pain.  Pt states that she has taken ibuprofen at home with no relief.  Pt is ambulatory to triage, in NAD and A&Ox4.

## 2017-04-22 NOTE — ED Notes (Signed)
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
## 2018-02-15 ENCOUNTER — Encounter: Payer: Self-pay | Admitting: Emergency Medicine

## 2018-02-15 ENCOUNTER — Emergency Department
Admission: EM | Admit: 2018-02-15 | Discharge: 2018-02-15 | Disposition: A | Discharge status: Home / Self Care | Payer: Medicaid Other | Attending: Emergency Medicine | Admitting: Emergency Medicine

## 2018-02-15 ENCOUNTER — Other Ambulatory Visit: Payer: Self-pay

## 2018-02-15 DIAGNOSIS — F410 Panic disorder [episodic paroxysmal anxiety] without agoraphobia: Secondary | ICD-10-CM | POA: Insufficient documentation

## 2018-02-15 DIAGNOSIS — Z79899 Other long term (current) drug therapy: Secondary | ICD-10-CM | POA: Insufficient documentation

## 2018-02-15 LAB — URINALYSIS, COMPLETE (UACMP) WITH MICROSCOPIC
Bilirubin Urine: NEGATIVE
GLUCOSE, UA: NEGATIVE mg/dL
Hgb urine dipstick: NEGATIVE
KETONES UR: NEGATIVE mg/dL
Nitrite: NEGATIVE
PROTEIN: NEGATIVE mg/dL
Specific Gravity, Urine: 1.009 (ref 1.005–1.030)
pH: 7 (ref 5.0–8.0)

## 2018-02-15 LAB — GLUCOSE, CAPILLARY: Glucose-Capillary: 84 mg/dL (ref 65–99)

## 2018-02-15 LAB — POCT PREGNANCY, URINE: Preg Test, Ur: NEGATIVE

## 2018-02-15 NOTE — ED Provider Notes (Signed)
[facility] Emergency Department Provider Note  ____________________________________________  Time seen: Approximately 1:12 PM  I have reviewed the triage vital signs and the nursing notes.   HISTORY  Chief Complaint No chief complaint on file.    HPI [name] is a 27 y.o. female who complains of rapid onset anxiety attack with chest tightness, shortness of breath, dizziness that started this morning. Onset was after looking at her bills. She is under significant stress and financial strain recently after recently moving out of her mother's house one month ago in her own apartment, living on her own for the first time. She has a history of anxiety and this feels similar. She is also taking medicine for bipolar disorder and depression and is compliant. Denies any SI HI or hallucinations. Anxiety symptoms were constant, severe, no aggravating or alleviating factors. Resolve spontaneously with time and closing her eyes and deep breathing here in the ED.  Currently she feels that her symptoms have resolved. She does report a recurrence of chronic abdominal pain, worsened the suprapubic area, which is typical for her after her anxiety improves. denies dysuria or frequency. patient denies any history of recent travel, hospitalization surgery. No history of DVT or PE.  she follows up with [facility] regarding her mental health issues.  Past Medical History:  Diagnosis Date  . Abscess   . Bipolar disorder (HCC)   . Depression   . Genital herpes      Patient Active Problem List   Diagnosis Date Noted  . SOB (shortness of breath) 05/30/2014  . Other chest pain 05/30/2014  . Rapid heart beat 05/30/2014  . Panic attacks 05/30/2014  . Anxiety 05/30/2014     Past Surgical History:  Procedure Laterality Date  . MOUTH SURGERY    cervical fusion   Prior to Admission medications   Medication Sig Start Date End Date Taking? Authorizing Provider   diflunisal (DOLOBID) 500 MG TABS tablet Take 1 tablet (500 mg total) by mouth 2 (two) times daily. 11/08/16   Tommi Rumps, PA-C  DULoxetine (CYMBALTA) 30 MG capsule Take 3 capsules (90 mg total) by mouth at bedtime. 10/13/16 10/20/16  Nira Conn, MD  oxyCODONE-acetaminophen (ROXICET) 5-325 MG tablet Take 1 tablet by mouth every 4 (four) hours as needed for severe pain. 02/24/17   Darci Current, MD  predniSONE (DELTASONE) 50 MG tablet Take one tablet by mouth daily for the next five days. 04/22/17   Pia Mau M, PA-C  QUEtiapine (SEROQUEL XR) 400 MG 24 hr tablet Take 1 tablet (400 mg total) by mouth at bedtime. 10/13/16 10/20/16  Nira Conn, MD     Allergies Nickel   Family History  Problem Relation Age of Onset  . Hyperlipidemia Mother   . Hypertension Mother   . Hyperlipidemia Father     Social History Social History   Tobacco Use  . Smoking status: Never Smoker  . Smokeless tobacco: Never Used  Substance Use Topics  . Alcohol use: Yes    Comment: Rare.   . Drug use: No    Review of Systems  Constitutional:   No fever or chills.  ENT:   No sore throat. No rhinorrhea. Cardiovascular:   positive chest tightness during anxiety attack, now resolved. No syncope. Respiratory:   positive shortness of breath and hyperventilation without cough. Gastrointestinal:   positive for generalized abdominal pain without vomiting and diarrhea.  Musculoskeletal:   Negative for focal pain or swelling All other  systems reviewed and are negative except as documented above in ROS and HPI.  ____________________________________________   PHYSICAL EXAM:  VITAL SIGNS: ED Triage Vitals [02/15/18 1024]  Enc Vitals Group     BP 117/85     Pulse Rate (!) 101     Resp 16     Temp (!) 97.5 F (36.4 C)     Temp Source Oral     SpO2 100 %     Weight 200 lb (90.7 kg)     Height  (1.6 m)     Head Circumference      Peak Flow      Pain Score 0     Pain Loc       Pain Edu?      Excl. in GC?     Vital signs reviewed, nursing assessments reviewed.   Constitutional:   Alert and oriented. Well appearing and in no distress. Eyes:   Conjunctivae are normal. EOMI. PERRL. ENT      Head:   Normocephalic and atraumatic.      Nose:   No congestion/rhinnorhea.       Mouth/Throat:   MMM, no pharyngeal erythema. No peritonsillar mass.       Neck:   No meningismus. Full ROM.thyroid nonpalpable. Spinal surgery scar left anterior neck. Hematological/Lymphatic/Immunilogical:   No cervical lymphadenopathy. Cardiovascular:   RRR. Symmetric bilateral radial and DP pulses.  No murmurs.  Respiratory:   Normal respiratory effort without tachypnea/retractions. Breath sounds are clear and equal bilaterally. No wheezes/rales/rhonchi. Gastrointestinal:   Soft with mild generalized discomfort. No focal tenderness.. Non distended. There is no CVA tenderness.  No rebound, rigidity, or guarding. Genitourinary:   deferred Musculoskeletal:   Normal range of motion in all extremities. No joint effusions.  No lower extremity tenderness.  No edema. Neurologic:   Normal speech and language.  Motor grossly intact. No acute focal neurologic deficits are appreciated.  Skin:    Skin is warm, dry and intact. No rash noted.  No petechiae, purpura, or bullae.  ____________________________________________    LABS (pertinent positives/negatives) (all labs ordered are listed, but only abnormal results are displayed) Labs Reviewed  URINALYSIS, COMPLETE (UACMP) WITH MICROSCOPIC - Abnormal; Notable for the following components:      Result Value   Color, Urine YELLOW (*)    APPearance HAZY (*)    Leukocytes, UA TRACE (*)    Bacteria, UA RARE (*)    All other components within normal limits  GLUCOSE, CAPILLARY  POC URINE PREG, ED  CBG MONITORING, ED  POCT PREGNANCY, URINE   ____________________________________________   EKG  interpreted by me Normal sinus rhythm rate of  84, normal axis intervals QRS ST segments and T waves.  ____________________________________________    RADIOLOGY  No results found.  ____________________________________________   PROCEDURES Procedures  ____________________________________________    CLINICAL IMPRESSION / ASSESSMENT AND PLAN / ED COURSE  Pertinent labs & imaging results that were available during my care of the patient were reviewed by me and considered in my medical decision making (see chart for details).    patient presents with symptoms consistent with anxiety attack. No atypical features today. Low risk for pulmonary embolism. Low suspicion for pneumothorax or pneumonia or ACS or dissection. Exam is reassuring. We'll check EKG, fingerstick glucose, urinalysis and pregnancy test.  Clinical Course as of Feb 15 1433  Tue Feb 15, 2018  1431 Labs unremarkable. Nl FSBG. Not pregnant. Will DC to f/u with [facility].   [PS]  Clinical Course User Index [PS] Sharman Cheek, MD     ____________________________________________   FINAL CLINICAL IMPRESSION(S) / ED DIAGNOSES    Final diagnoses:  Anxiety attack     ED Discharge Orders    None      Portions of this note were generated with dragon dictation software. Dictation errors may occur despite best attempts at proofreading.    Sharman Cheek, MD 02/15/18 1434

## 2018-02-15 NOTE — ED Triage Notes (Signed)
Pt to ED via [facility] from work for anxiety attack. Pt has hx/o same. Pt states that this feels similar to her previous anxiety attacks. Pt is in NAD at this time.  BP 124/68 HR 106, ST on monitor, 100% on room air

## 2018-02-15 NOTE — Discharge Instructions (Addendum)
Your urine test and blood sugar were normal today.

## 2018-06-02 IMAGING — CR DG ANKLE COMPLETE 3+V*L*
1 series · 3 of 3 positions shown · non-contrast
Comparison: None.

CLINICAL DATA: Pain after twisting injury at [DATE] tonight

EXAM:
LEFT ANKLE COMPLETE - 3+ VIEW

[Series 1: x ankle ap left · 0.14mm/px · 3 of 3 slices shown]
[im 1/3]
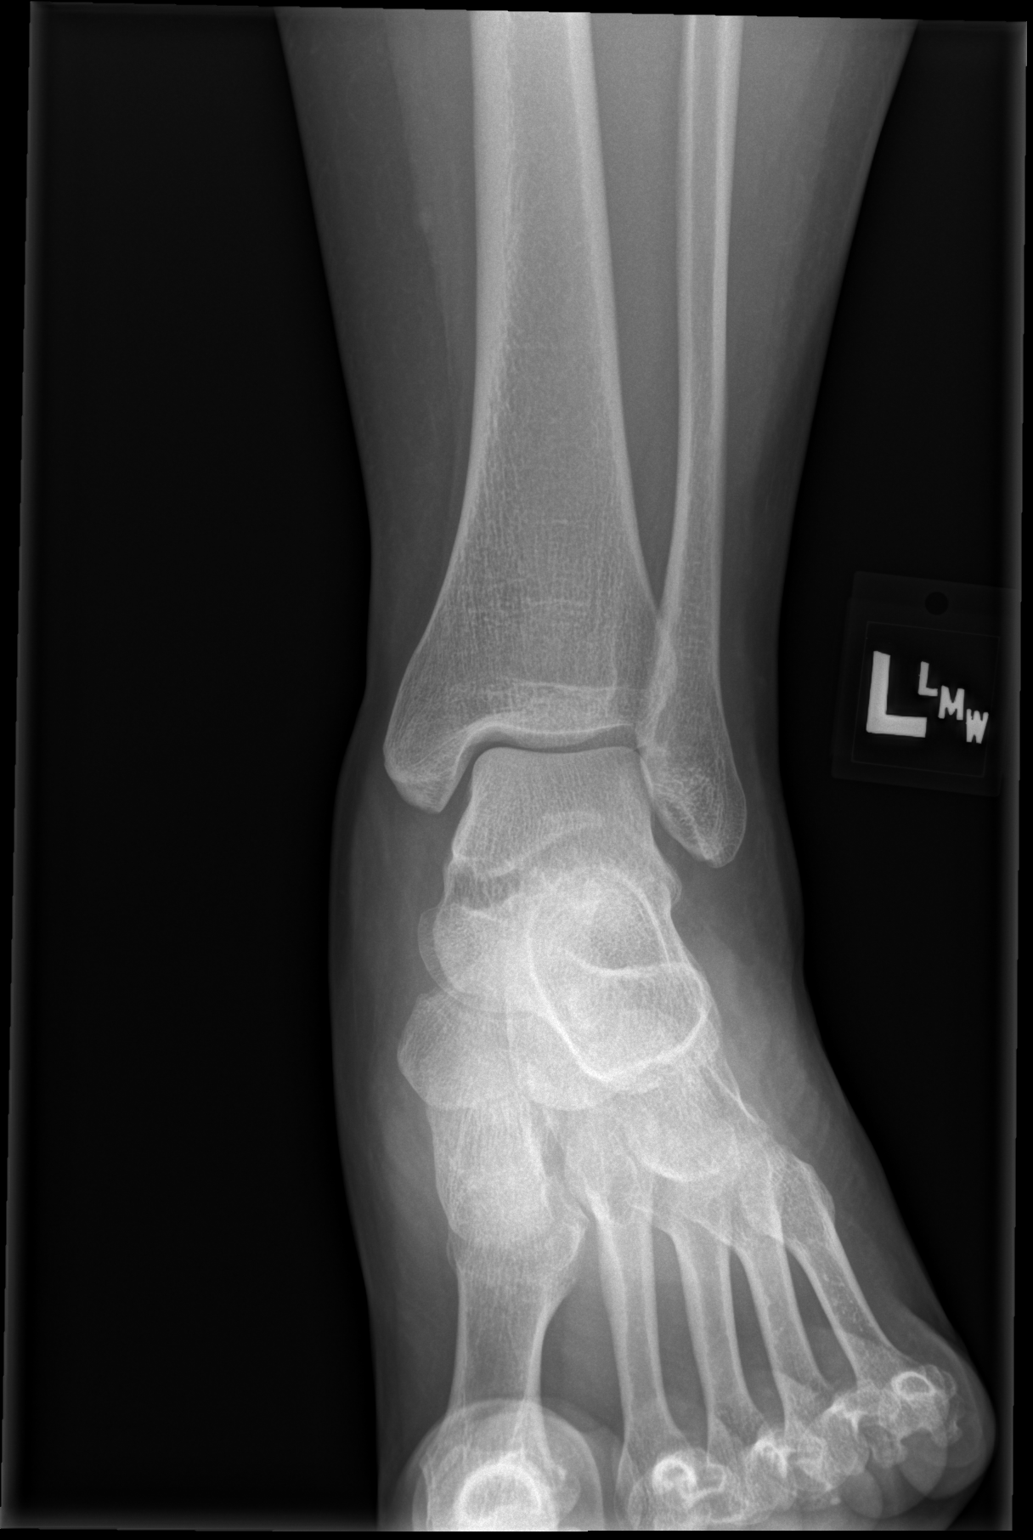
[im 2/3]
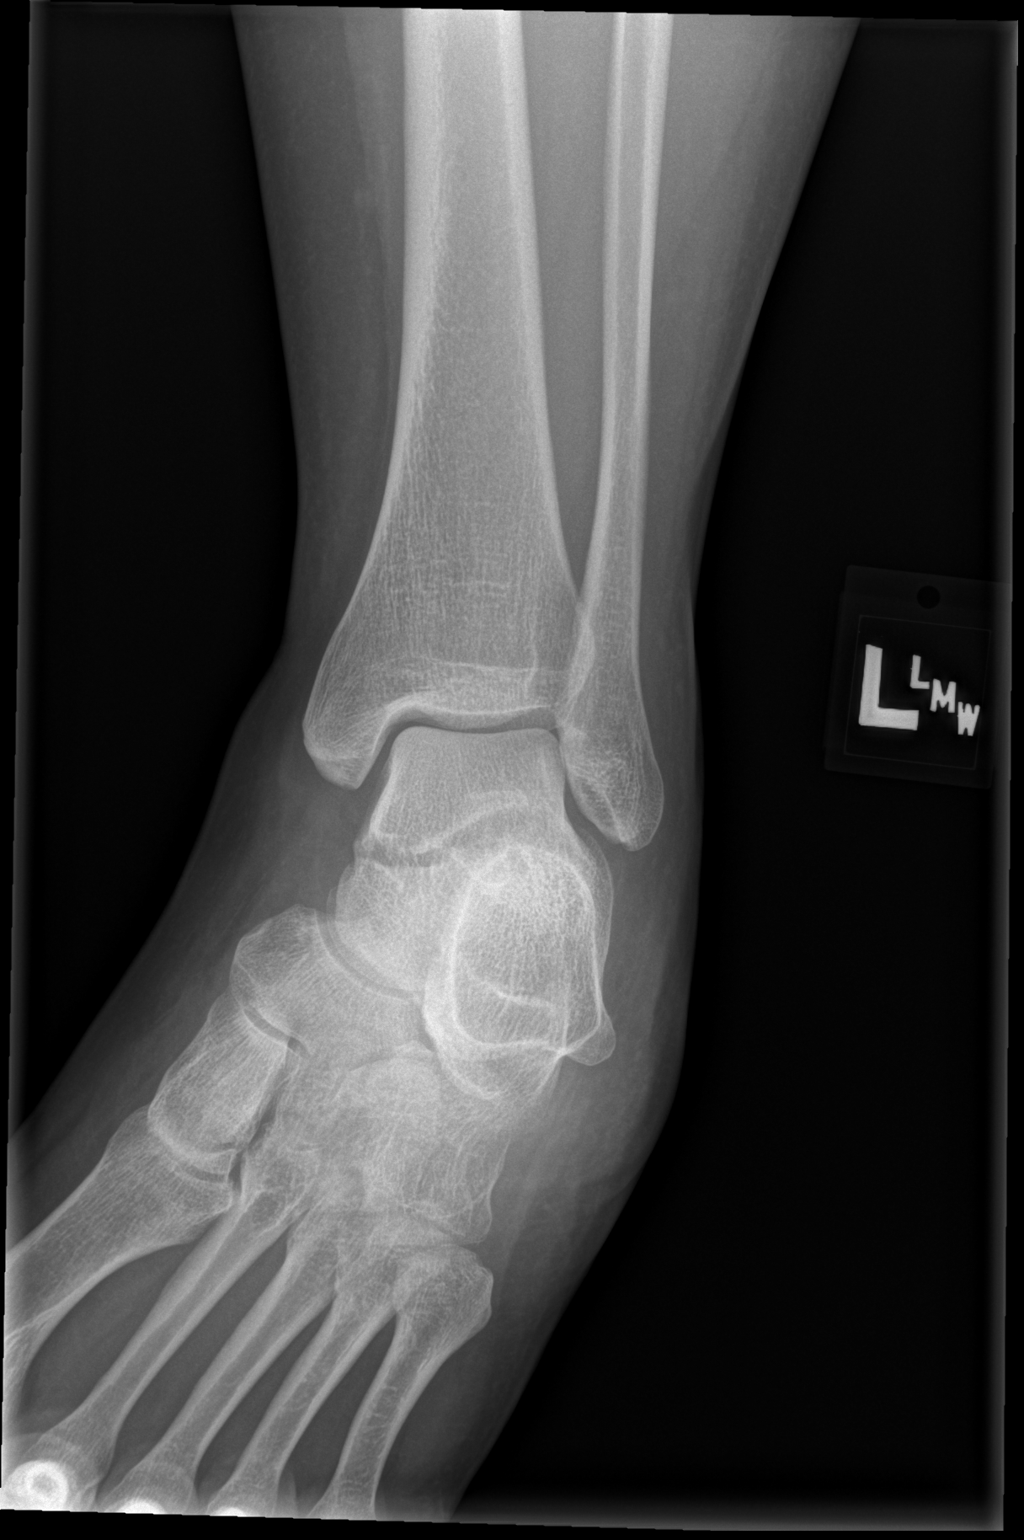
[im 3/3]
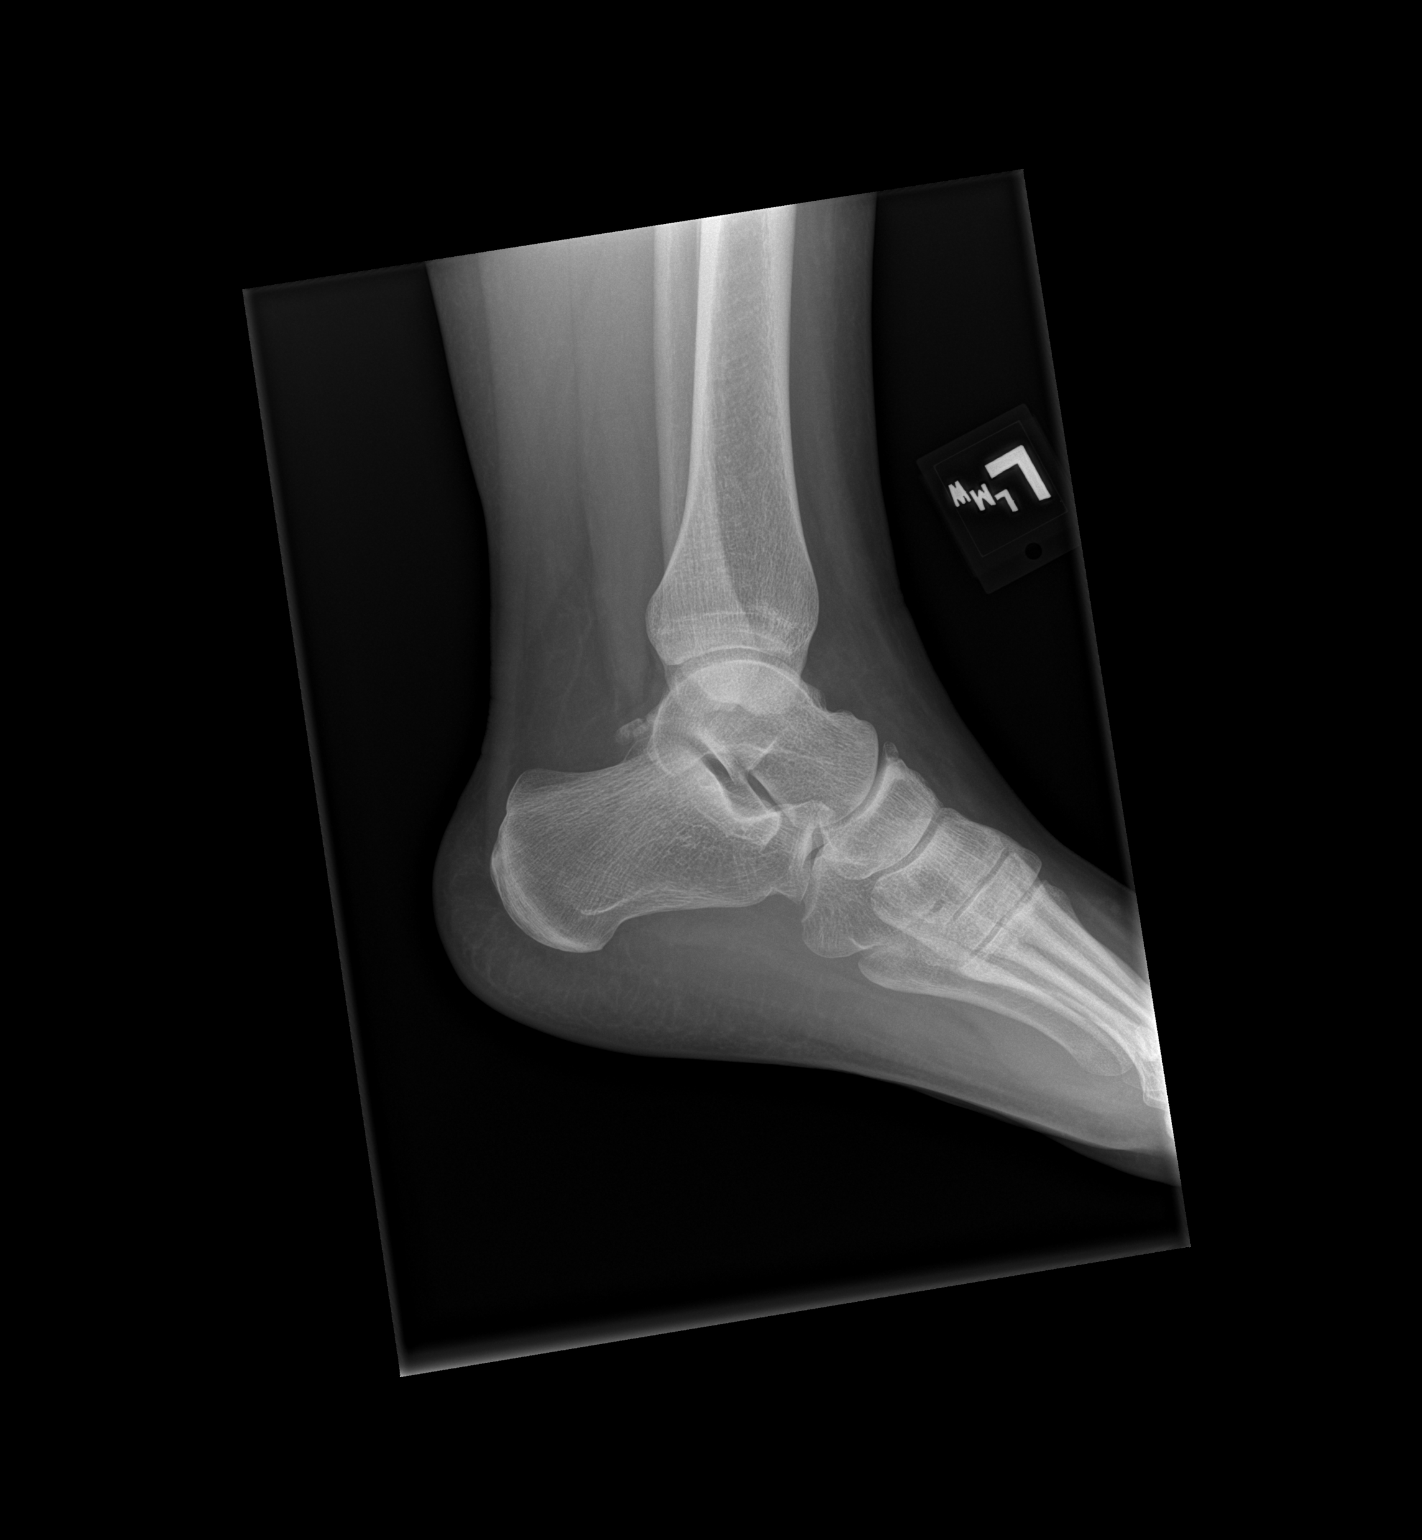

[3 of 3 positions shown; findings below may reference images not displayed]

FINDINGS: There is no evidence of fracture, dislocation, or joint effusion.
There is no evidence of arthropathy or other focal bone abnormality.
Soft tissues are unremarkable.
IMPRESSION: Negative.

## 2018-10-18 ENCOUNTER — Encounter: Payer: Self-pay | Admitting: Emergency Medicine

## 2018-10-18 ENCOUNTER — Emergency Department
Admission: EM | Admit: 2018-10-18 | Discharge: 2018-10-18 | Disposition: A | Discharge status: Home / Self Care | Payer: Medicaid Other | Attending: Emergency Medicine | Admitting: Emergency Medicine

## 2018-10-18 ENCOUNTER — Other Ambulatory Visit: Payer: Self-pay

## 2018-10-18 DIAGNOSIS — Z79899 Other long term (current) drug therapy: Secondary | ICD-10-CM | POA: Diagnosis not present

## 2018-10-18 DIAGNOSIS — M5416 Radiculopathy, lumbar region: Secondary | ICD-10-CM | POA: Diagnosis not present

## 2018-10-18 DIAGNOSIS — F329 Major depressive disorder, single episode, unspecified: Secondary | ICD-10-CM | POA: Insufficient documentation

## 2018-10-18 DIAGNOSIS — M545 Low back pain: Secondary | ICD-10-CM | POA: Diagnosis present

## 2018-10-18 LAB — URINALYSIS, COMPLETE (UACMP) WITH MICROSCOPIC
Bilirubin Urine: NEGATIVE
Glucose, UA: NEGATIVE mg/dL
Hgb urine dipstick: NEGATIVE
Ketones, ur: NEGATIVE mg/dL
Nitrite: NEGATIVE
Protein, ur: NEGATIVE mg/dL
Specific Gravity, Urine: 1.015 (ref 1.005–1.030)
pH: 5 (ref 5.0–8.0)

## 2018-10-18 LAB — POCT PREGNANCY, URINE: PREG TEST UR: NEGATIVE

## 2018-10-18 MED ORDER — METHOCARBAMOL 500 MG PO TABS
1000.0000 mg | ORAL_TABLET | Freq: Once | ORAL | Status: AC
  Administered 2018-10-18: 1000 mg via ORAL
  Filled 2018-10-18: qty 2

## 2018-10-18 MED ORDER — PREDNISONE 20 MG PO TABS
60.0000 mg | ORAL_TABLET | Freq: Once | ORAL | Status: AC
  Administered 2018-10-18: 60 mg via ORAL
  Filled 2018-10-18: qty 3

## 2018-10-18 MED ORDER — PREDNISONE 10 MG (21) PO TBPK: ORAL_TABLET | ORAL | 0 refills | Status: DC

## 2018-10-18 MED ORDER — METHOCARBAMOL 500 MG PO TABS: 500.0000 mg | ORAL_TABLET | Freq: Three times a day (TID) | ORAL | 0 refills | Status: AC | PRN

## 2018-10-18 NOTE — ED Notes (Signed)
See triage note   Presents with lower back pain pain  States woke up with pain  States pain is moving into right leg  Denies any injury  Ambulates with limp d/t pain

## 2018-10-18 NOTE — ED Triage Notes (Signed)
Right lower back pain x 1 day.  Pain worse with movement.

## 2018-10-18 NOTE — ED Provider Notes (Signed)
[facility] Emergency Department Provider Note  ____________________________________________  Time seen: Approximately 8:18 PM  I have reviewed the triage vital signs and the nursing notes.   HISTORY  Chief Complaint Back Pain    HPI Benson SettingHayley S Mcinerny is a 27 y.o. female presents to the emergency department with right-sided low back pain that radiates down the right leg for the past 2 to 3 days.  Patient reports that she works at [employer] and often lifts heavy boxes of fries.  Patient denies recent falls or traumas.  She has not experienced similar episodes of radiculopathy.  No weakness.  No bowel or bladder incontinence or saddle anesthesia.  No alleviating measures have been attempted.   Past Medical History:  Diagnosis Date  . Abscess   . Bipolar disorder (HCC)   . Depression   . Genital herpes     Patient Active Problem List   Diagnosis Date Noted  . SOB (shortness of breath) 05/30/2014  . Other chest pain 05/30/2014  . Rapid heart beat 05/30/2014  . Panic attacks 05/30/2014  . Anxiety 05/30/2014    Past Surgical History:  Procedure Laterality Date  . MOUTH SURGERY      Prior to Admission medications   Medication Sig Start Date End Date Taking? Authorizing Provider  diflunisal (DOLOBID) 500 MG TABS tablet Take 1 tablet (500 mg total) by mouth 2 (two) times daily. 11/08/16   Tommi RumpsSummers, Rhonda L, PA-C  DULoxetine (CYMBALTA) 30 MG capsule Take 3 capsules (90 mg total) by mouth at bedtime. 10/13/16 10/20/16  Nira Connardama, Pedro Eduardo, MD  methocarbamol (ROBAXIN) 500 MG tablet Take 1 tablet (500 mg total) by mouth every 8 (eight) hours as needed for up to 5 days. 10/18/18 10/23/18  Orvil FeilWoods, Jaycelynn Knickerbocker M, PA-C  oxyCODONE-acetaminophen (ROXICET) 5-325 MG tablet Take 1 tablet by mouth every 4 (four) hours as needed for severe pain. 02/24/17   Darci CurrentBrown, [place] N, MD  predniSONE (STERAPRED UNI-PAK 21 TAB) 10 MG (21) TBPK tablet Take 6 tabs the the 1st day. Take 6 tabs the  the 2nd day. Take 5 tabs the the 3rd day. Take 5 tabs the 4th day. Take 4 tabs the the 5th day.Take 4 tabs the the 6th day.Take 3 tabs the 7th day.Take 3 tabs the 8th day. Take 2 tabs the 9th day. Take 2 tabs the 10th day. Take 1 tab the 11th day. Take 1 tab the 12th day. 10/18/18   Orvil FeilWoods, Falcon Mccaskey M, PA-C  QUEtiapine (SEROQUEL XR) 400 MG 24 hr tablet Take 1 tablet (400 mg total) by mouth at bedtime. 10/13/16 10/20/16  Nira Connardama, Pedro Eduardo, MD    Allergies Nickel  Family History  Problem Relation Age of Onset  . Hyperlipidemia Mother   . Hypertension Mother   . Hyperlipidemia Father     Social History Social History   Tobacco Use  . Smoking status: Never Smoker  . Smokeless tobacco: Never Used  Substance Use Topics  . Alcohol use: Yes    Comment: Rare.   . Drug use: No     Review of Systems  Constitutional: No fever/chills Eyes: No visual changes. No discharge ENT: No upper respiratory complaints. Cardiovascular: no chest pain. Respiratory: no cough. No SOB. Gastrointestinal: No abdominal pain.  No nausea, no vomiting.  No diarrhea.  No constipation. Genitourinary: Negative for dysuria. No hematuria Musculoskeletal: Patient has low back pain.  Skin: Negative for rash, abrasions, lacerations, ecchymosis. Neurological: Patient has radiculpathy.   ____________________________________________   PHYSICAL EXAM:  VITAL  SIGNS: ED Triage Vitals  Enc Vitals Group     BP 10/18/18 1700 116/77     Pulse Rate 10/18/18 1700 94     Resp 10/18/18 1700 18     Temp 10/18/18 1700 98 F (36.7 C)     Temp Source 10/18/18 1700 Oral     SpO2 10/18/18 1700 99 %     Weight 10/18/18 1700 200 lb (90.7 kg)     Height 10/18/18 1700 5\' 3"  (1.6 m)     Head Circumference --      Peak Flow --      Pain Score 10/18/18 1710 8     Pain Loc --      Pain Edu? --      Excl. in GC? --      Constitutional: Alert and oriented. Well appearing and in no acute distress. Eyes: Conjunctivae are  normal. PERRL. EOMI. Head: Atraumatic.  Cardiovascular: Normal rate, regular rhythm. Normal S1 and S2.  Good peripheral circulation. Respiratory: Normal respiratory effort without tachypnea or retractions. Lungs CTAB. Good air entry to the bases with no decreased or absent breath sounds. Gastrointestinal: Bowel sounds 4 quadrants. Soft and nontender to palpation. No guarding or rigidity. No palpable masses. No distention. No CVA tenderness. Musculoskeletal: Full range of motion to all extremities. No gross deformities appreciated.  Mild paraspinal muscle tenderness along the lumbar spine on the right.  Positive straight leg raise test, right. Neurologic:  Normal speech and language. No gross focal neurologic deficits are appreciated.  Skin:  Skin is warm, dry and intact. No rash noted. Psychiatric: Mood and affect are normal. Speech and behavior are normal. Patient exhibits appropriate insight and judgement.   ____________________________________________   LABS (all labs ordered are listed, but only abnormal results are displayed)  Labs Reviewed  URINALYSIS, COMPLETE (UACMP) WITH MICROSCOPIC - Abnormal; Notable for the following components:      Result Value   Color, Urine YELLOW (*)    APPearance HAZY (*)    Leukocytes, UA MODERATE (*)    Bacteria, UA RARE (*)    All other components within normal limits  POC URINE PREG, ED  POCT PREGNANCY, URINE   ____________________________________________  EKG   ____________________________________________  RADIOLOGY   No results found.  ____________________________________________    PROCEDURES  Procedure(s) performed:    Procedures    Medications  methocarbamol (ROBAXIN) tablet 1,000 mg (1,000 mg Oral Given 10/18/18 1843)  predniSONE (DELTASONE) tablet 60 mg (60 mg Oral Given 10/18/18 1843)     ____________________________________________   INITIAL IMPRESSION / ASSESSMENT AND PLAN / ED COURSE  Pertinent labs &  imaging results that were available during my care of the patient were reviewed by me and considered in my medical decision making (see chart for details).  Review of the [place] CSRS was performed in accordance of the [facility] prior to dispensing any controlled drugs.    Assessment and plan Lumbar radiculopathy Patient presents to the emergency department with low back pain for the past 2 to 3 days with right lower extremity radiculopathy.  Patient was treated with prednisone and Robaxin.  She was advised to follow-up with primary care as needed.  All patient questions were answered.     ____________________________________________  FINAL CLINICAL IMPRESSION(S) / ED DIAGNOSES  Final diagnoses:  Lumbar radiculopathy      NEW MEDICATIONS STARTED DURING THIS VISIT:  ED Discharge Orders         Ordered    predniSONE (STERAPRED UNI-PAK 21  TAB) 10 MG (21) TBPK tablet     10/18/18 1834    methocarbamol (ROBAXIN) 500 MG tablet  Every 8 hours PRN     10/18/18 1834              This chart was dictated using voice recognition software/Dragon. Despite best efforts to proofread, errors can occur which can change the meaning. Any change was purely unintentional.    Orvil FeilWoods, Annisten [place] M, PA-C 10/18/18 2040    Emily FilbertWilliams, Jonathan E, MD 10/18/18 2308

## 2018-11-02 ENCOUNTER — Encounter: Payer: Self-pay | Admitting: Emergency Medicine

## 2018-11-02 ENCOUNTER — Other Ambulatory Visit: Payer: Self-pay

## 2018-11-02 DIAGNOSIS — R1031 Right lower quadrant pain: Secondary | ICD-10-CM | POA: Diagnosis present

## 2018-11-02 DIAGNOSIS — F319 Bipolar disorder, unspecified: Secondary | ICD-10-CM | POA: Diagnosis not present

## 2018-11-02 DIAGNOSIS — F419 Anxiety disorder, unspecified: Secondary | ICD-10-CM | POA: Insufficient documentation

## 2018-11-02 NOTE — ED Triage Notes (Signed)
Pt presents to ED with right sided flank pian. Pt states she has a hx of pyelonephritis but this time she does not have any urinary symptoms. Sudden onset of pain around 1800. Does not seem to be affected by movement.

## 2018-11-03 ENCOUNTER — Emergency Department
Admission: EM | Admit: 2018-11-03 | Discharge: 2018-11-03 | Disposition: A | Discharge status: Home / Self Care | Payer: 59 | Attending: Emergency Medicine | Admitting: Emergency Medicine

## 2018-11-03 ENCOUNTER — Emergency Department: Payer: 59

## 2018-11-03 ENCOUNTER — Encounter: Payer: Self-pay | Admitting: Radiology

## 2018-11-03 DIAGNOSIS — R109 Unspecified abdominal pain: Secondary | ICD-10-CM

## 2018-11-03 HISTORY — DX: Anxiety disorder, unspecified: F41.9

## 2018-11-03 LAB — COMPREHENSIVE METABOLIC PANEL
ALT: 21 U/L (ref 0–44)
AST: 21 U/L (ref 15–41)
Albumin: 4 g/dL (ref 3.5–5.0)
Alkaline Phosphatase: 68 U/L (ref 38–126)
Anion gap: 9 (ref 5–15)
BUN: 9 mg/dL (ref 6–20)
CO2: 23 mmol/L (ref 22–32)
CREATININE: 0.92 mg/dL (ref 0.44–1.00)
Calcium: 9.1 mg/dL (ref 8.9–10.3)
Chloride: 106 mmol/L (ref 98–111)
GFR calc Af Amer: >60 mL/min (ref >60)
GFR calc non Af Amer: >60 mL/min (ref >60)
Glucose, Bld: 93 mg/dL (ref 70–99)
Potassium: 3.4 mmol/L — ABNORMAL LOW (ref 3.5–5.1)
Sodium: 138 mmol/L (ref 135–145)
Total Bilirubin: 0.5 mg/dL (ref 0.3–1.2)
Total Protein: 7.3 g/dL (ref 6.5–8.1)

## 2018-11-03 LAB — CBC
HCT: 39.7 % (ref 36.0–46.0)
Hemoglobin: 13.4 g/dL (ref 12.0–15.0)
MCH: 30.5 pg (ref 26.0–34.0)
MCHC: 33.8 g/dL (ref 30.0–36.0)
MCV: 90.2 fL (ref 80.0–100.0)
Platelets: 385 10*3/uL (ref 150–400)
RBC: 4.4 MIL/uL (ref 3.87–5.11)
RDW: 12.1 % (ref 11.5–15.5)
WBC: 14 10*3/uL — ABNORMAL HIGH (ref 4.0–10.5)
nRBC: 0 % (ref 0.0–0.2)

## 2018-11-03 LAB — URINALYSIS, COMPLETE (UACMP) WITH MICROSCOPIC
Bilirubin Urine: NEGATIVE
Glucose, UA: NEGATIVE mg/dL
Hgb urine dipstick: NEGATIVE
Ketones, ur: NEGATIVE mg/dL
Nitrite: NEGATIVE
Protein, ur: NEGATIVE mg/dL
Specific Gravity, Urine: 1.016 (ref 1.005–1.030)
pH: 7 (ref 5.0–8.0)

## 2018-11-03 LAB — POCT PREGNANCY, URINE: Preg Test, Ur: NEGATIVE

## 2018-11-03 MED ORDER — KETOROLAC TROMETHAMINE 30 MG/ML IJ SOLN
15.0000 mg | Freq: Once | INTRAMUSCULAR | Status: AC
  Administered 2018-11-03: 15 mg via INTRAVENOUS
  Filled 2018-11-03: qty 1

## 2018-11-03 MED ORDER — MORPHINE SULFATE (PF) 4 MG/ML IV SOLN
4.0000 mg | Freq: Once | INTRAVENOUS | Status: AC
  Administered 2018-11-03: 4 mg via INTRAVENOUS
  Filled 2018-11-03: qty 1

## 2018-11-03 MED ORDER — IOHEXOL 300 MG/ML  SOLN
100.0000 mL | Freq: Once | INTRAMUSCULAR | Status: AC | PRN
  Administered 2018-11-03: 100 mL via INTRAVENOUS

## 2018-11-03 MED ORDER — ONDANSETRON HCL 4 MG/2ML IJ SOLN
4.0000 mg | INTRAMUSCULAR | Status: AC
  Administered 2018-11-03: 4 mg via INTRAVENOUS
  Filled 2018-11-03: qty 2

## 2018-11-03 NOTE — ED Provider Notes (Signed)
[facility] Emergency Department Provider Note  ____________________________________________   First MD Initiated Contact with Patient 11/03/18 0041     (approximate)  I have reviewed the triage vital signs and the nursing notes.   HISTORY  Chief Complaint Flank Pain    HPI [name] is a 28 y.o. female with medical history as listed below who presents for evaluation of acute onset right flank pain that radiates to her right lower abdomen.  She says that it occurred at work and it feels like a constant dull ache with occasional sharp pains.  She has had musculoskeletal pain and strain from work before, but she states this feels different.  She has no history of kidney stones but has had kidney infections in the past.  She denies fever/chills, chest pain, shortness of breath.  She has had nausea but no vomiting.  She denies recent dysuria and hematuria.  She reports that the sharp pains are severe and nothing in particular makes them better or worse.  Past Medical History:  Diagnosis Date  . Abscess   . Anxiety   . Bipolar disorder (HCC)   . Depression   . Genital herpes     Patient Active Problem List   Diagnosis Date Noted  . SOB (shortness of breath) 05/30/2014  . Other chest pain 05/30/2014  . Rapid heart beat 05/30/2014  . Panic attacks 05/30/2014  . Anxiety 05/30/2014    Past Surgical History:  Procedure Laterality Date  . CERVICAL DISC SURGERY    . MOUTH SURGERY      Prior to Admission medications   Medication Sig Start Date End Date Taking? Authorizing Provider  diflunisal (DOLOBID) 500 MG TABS tablet Take 1 tablet (500 mg total) by mouth 2 (two) times daily. 11/08/16   Tommi Rumps, PA-C  DULoxetine (CYMBALTA) 30 MG capsule Take 3 capsules (90 mg total) by mouth at bedtime. 10/13/16 10/20/16  Nira Conn, MD  oxyCODONE-acetaminophen (ROXICET) 5-325 MG tablet Take 1 tablet by mouth every 4 (four) hours as needed for  severe pain. 02/24/17   Darci Current, MD  predniSONE (STERAPRED UNI-PAK 21 TAB) 10 MG (21) TBPK tablet Take 6 tabs the the 1st day. Take 6 tabs the the 2nd day. Take 5 tabs the the 3rd day. Take 5 tabs the 4th day. Take 4 tabs the the 5th day.Take 4 tabs the the 6th day.Take 3 tabs the 7th day.Take 3 tabs the 8th day. Take 2 tabs the 9th day. Take 2 tabs the 10th day. Take 1 tab the 11th day. Take 1 tab the 12th day. 10/18/18   Orvil Feil, PA-C  QUEtiapine (SEROQUEL XR) 400 MG 24 hr tablet Take 1 tablet (400 mg total) by mouth at bedtime. 10/13/16 10/20/16  Nira Conn, MD    Allergies Nickel  Family History  Problem Relation Age of Onset  . Hyperlipidemia Mother   . Hypertension Mother   . Hyperlipidemia Father     Social History Social History   Tobacco Use  . Smoking status: Never Smoker  . Smokeless tobacco: Never Used  Substance Use Topics  . Alcohol use: Yes    Comment: Rare.   . Drug use: No    Review of Systems Constitutional: No fever/chills Eyes: No visual changes. ENT: No sore throat. Cardiovascular: Denies chest pain. Respiratory: Denies shortness of breath. Gastrointestinal: Right-sided flank pain radiating to her right lower quadrant.  Nausea, no vomiting. Genitourinary: Negative for dysuria and hematuria.  Musculoskeletal: Negative for neck pain.  Negative for back pain. Integumentary: Negative for rash. Neurological: Negative for headaches, focal weakness or numbness.   ____________________________________________   PHYSICAL EXAM:  VITAL SIGNS: ED Triage Vitals  Enc Vitals Group     BP 11/02/18 2347 121/80     Pulse Rate 11/02/18 2347 98     Resp 11/02/18 2347 18     Temp 11/02/18 2347 97.9 F (36.6 C)     Temp Source 11/02/18 2347 Oral     SpO2 11/02/18 2347 99 %     Weight 11/02/18 2348 90.7 kg (200 lb)     Height 11/02/18 2348 1.6 m (5\' 3" )     Head Circumference --      Peak Flow --      Pain Score 11/02/18 2347 7      Pain Loc --      Pain Edu? --      Excl. in [place]? --     Constitutional: Alert and oriented.  Appears uncomfortable but is otherwise well-appearing.  Patient is holding a hand on her right flank for comfort. Eyes: Conjunctivae are normal.  Head: Atraumatic. Nose: No congestion/rhinnorhea. Mouth/Throat: Mucous membranes are moist. Neck: No stridor.  No meningeal signs.   Cardiovascular: Normal rate, regular rhythm. Good peripheral circulation. Grossly normal heart sounds. Respiratory: Normal respiratory effort.  No retractions. Lungs CTAB. Gastrointestinal: Soft and nondistended.  Tender to palpation in the right lower quadrant with guarding but no rebound. Genitourinary: Deferred Musculoskeletal: Moderate right-sided CVA tenderness to percussion.  No lower extremity tenderness nor edema. No gross deformities of extremities. Neurologic:  Normal speech and language. No gross focal neurologic deficits are appreciated.  Skin:  Skin is warm, dry and intact. No rash noted. Psychiatric: Mood and affect are normal. Speech and behavior are normal.  ____________________________________________   LABS (all labs ordered are listed, but only abnormal results are displayed)  Labs Reviewed  COMPREHENSIVE METABOLIC PANEL - Abnormal; Notable for the following components:      Result Value   Potassium 3.4 (*)    All other components within normal limits  CBC - Abnormal; Notable for the following components:   WBC 14.0 (*)    All other components within normal limits  URINALYSIS, COMPLETE (UACMP) WITH MICROSCOPIC - Abnormal; Notable for the following components:   Color, Urine YELLOW (*)    APPearance CLEAR (*)    Leukocytes, UA TRACE (*)    Bacteria, UA RARE (*)    All other components within normal limits  URINE CULTURE  POC URINE PREG, ED  POCT PREGNANCY, URINE   ____________________________________________  EKG  None - EKG not ordered by ED  physician ____________________________________________  RADIOLOGY   ED MD interpretation: No indication of acute abnormality on CT abdomen/pelvis  Official radiology report(s): Ct Abdomen Pelvis W Contrast  Result Date: 11/03/2018 CLINICAL DATA:  Right flank pain EXAM: CT ABDOMEN AND PELVIS WITH CONTRAST TECHNIQUE: Multidetector CT imaging of the abdomen and pelvis was performed using the standard protocol following bolus administration of intravenous contrast. CONTRAST:  OMNIPAQUE IOHEXOL 300 MG/ML  SOLN COMPARISON:  11/10/2014 FINDINGS: Lower chest: Lung bases are clear. Hepatobiliary: Liver is within normal limits. Gallbladder is unremarkable. No intrahepatic or extrahepatic ductal dilatation. Pancreas: Within normal limits. Spleen: Within normal limits. Adrenals/Urinary Tract: Adrenal glands are within normal limits. Kidneys are within normal limits. No differential enhancement or perinephric stranding to suggest pyelonephritis. No renal calculi or hydronephrosis. Bladder is within normal limits. Stomach/Bowel: Stomach  is within normal limits. No evidence of bowel obstruction. Normal appendix (series 2/image 65). Vascular/Lymphatic: No evidence of abdominal aortic aneurysm. Retroaortic left renal vein. No suspicious abdominopelvic lymphadenopathy. Reproductive: Uterus is within normal limits. Bilateral ovaries are within normal limits. Other: No abdominopelvic ascites. Musculoskeletal: Visualized osseous structures are within normal limits. IMPRESSION: Negative CT abdomen/pelvis. Electronically Signed   By: Charline BillsSriyesh  Krishnan M.D.   On: 11/03/2018 01:34    ____________________________________________   PROCEDURES  Critical Care performed: No   Procedure(s) performed:   Procedures   ____________________________________________   INITIAL IMPRESSION / ASSESSMENT AND PLAN / ED COURSE  As part of my medical decision making, I reviewed the following data within the electronic  MEDICAL RECORD NUMBER Nursing notes reviewed and incorporated, Labs reviewed , Old chart reviewed, Notes from prior ED visits and [place] Controlled Substance Database    Differential diagnosis includes, but is not limited to, renal colic, UTI/pyelonephritis, appendicitis, less likely ovarian cyst/torsion.  The patient is quite clearly indicating her right flank as the area of the discomfort although she does have some tenderness to palpation of the right lower quadrant as well.  Her lab work is all reassuring except for a mild leukocytosis of 14.  No indication of urinary tract infection although I did send a urine culture.  I will obtain a CT scan with IV contrast given the possibility this could be referred pain from an appendicitis and that even with IV contrast we should still be able to see a clinically significant ureteral stone.  By far the presentation and symptoms are most consistent with a ureteral stone causing renal colic.  I am giving morphine 4 mg IV and Zofran 4 mg IV and will reassess.  Patient understands and agrees with the plan.  Clinical Course as of Nov 04 331  Thu Nov 03, 2018  0235 No acute abnormalities identified including no sign of pyelonephritis, no obvious nephrolithiasis nor ureterolithiasis, no evidence of appendicitis, normal-appearing ovaries.  CT ABDOMEN PELVIS W CONTRAST [CF]  [phone] Patient reports the pain is better but not completely gone.  She does not appear to be in any distress and is able to converse with me easily.  I updated her that her CT scan was normal with no evidence of acute infection, no sign of kidney stones, etc.  I suggested it is possible she may have passed a stone and is still feeling some residual discomfort, or the issue may be musculoskeletal.  She understands and agrees with the plan for discharge and outpatient follow-up.  I gave my usual and customary return precautions for abdominal pain.   [CF]    Clinical Course User Index [CF] Loleta RoseForbach, Jeffifer Rabold, MD    Prior to discharge I gave the patient a dose of Toradol 15 mg IV. ____________________________________________  FINAL CLINICAL IMPRESSION(S) / ED DIAGNOSES  Final diagnoses:  Right flank pain  Right sided abdominal pain     MEDICATIONS GIVEN DURING THIS VISIT:  Medications  morphine 4 MG/ML injection 4 mg (4 mg Intravenous Given 11/03/18 0131)  ondansetron (ZOFRAN) injection 4 mg (4 mg Intravenous Given 11/03/18 0131)  iohexol (OMNIPAQUE) 300 MG/ML solution 100 mL (100 mLs Intravenous Contrast Given 11/03/18 0120)  ketorolac (TORADOL) 30 MG/ML injection 15 mg (15 mg Intravenous Given 11/03/18 0234)     ED Discharge Orders    None       Note:  This document was prepared using Dragon voice recognition software and may include unintentional dictation errors.  Loleta RoseForbach, Lincoln Ginley, MD 11/03/18 [phone]

## 2018-11-03 NOTE — ED Notes (Signed)
Pt to CT with CT tech

## 2018-11-03 NOTE — Discharge Instructions (Signed)
You have been seen in the Emergency Department (ED) for flank and abdominal pain.  Your evaluation did not identify a clear cause of your symptoms but was generally reassuring.  Please follow up as instructed above regarding todays emergent visit and the symptoms that are bothering you.  Return to the ED if your abdominal pain worsens or fails to improve, you develop bloody vomiting, bloody diarrhea, you are unable to tolerate fluids due to vomiting, fever greater than 101, or other symptoms that concern you.

## 2018-11-03 NOTE — ED Notes (Signed)
Pt is being discharged to home. Pt is AOx4, vss, she denies any abdominal pain at this time and does not show any signs of distress. AVS was given and explained to the pt and she verbalized understanding of all information.

## 2018-11-04 LAB — URINE CULTURE
Culture: NO GROWTH
Special Requests: NORMAL

## 2018-12-18 IMAGING — CR DG CHEST 2V
2 series · 2 of 2 positions shown · non-contrast
Comparison: 11/24/2015

CLINICAL DATA: Chest pain

EXAM:
CHEST  2 VIEW

[chest pa]
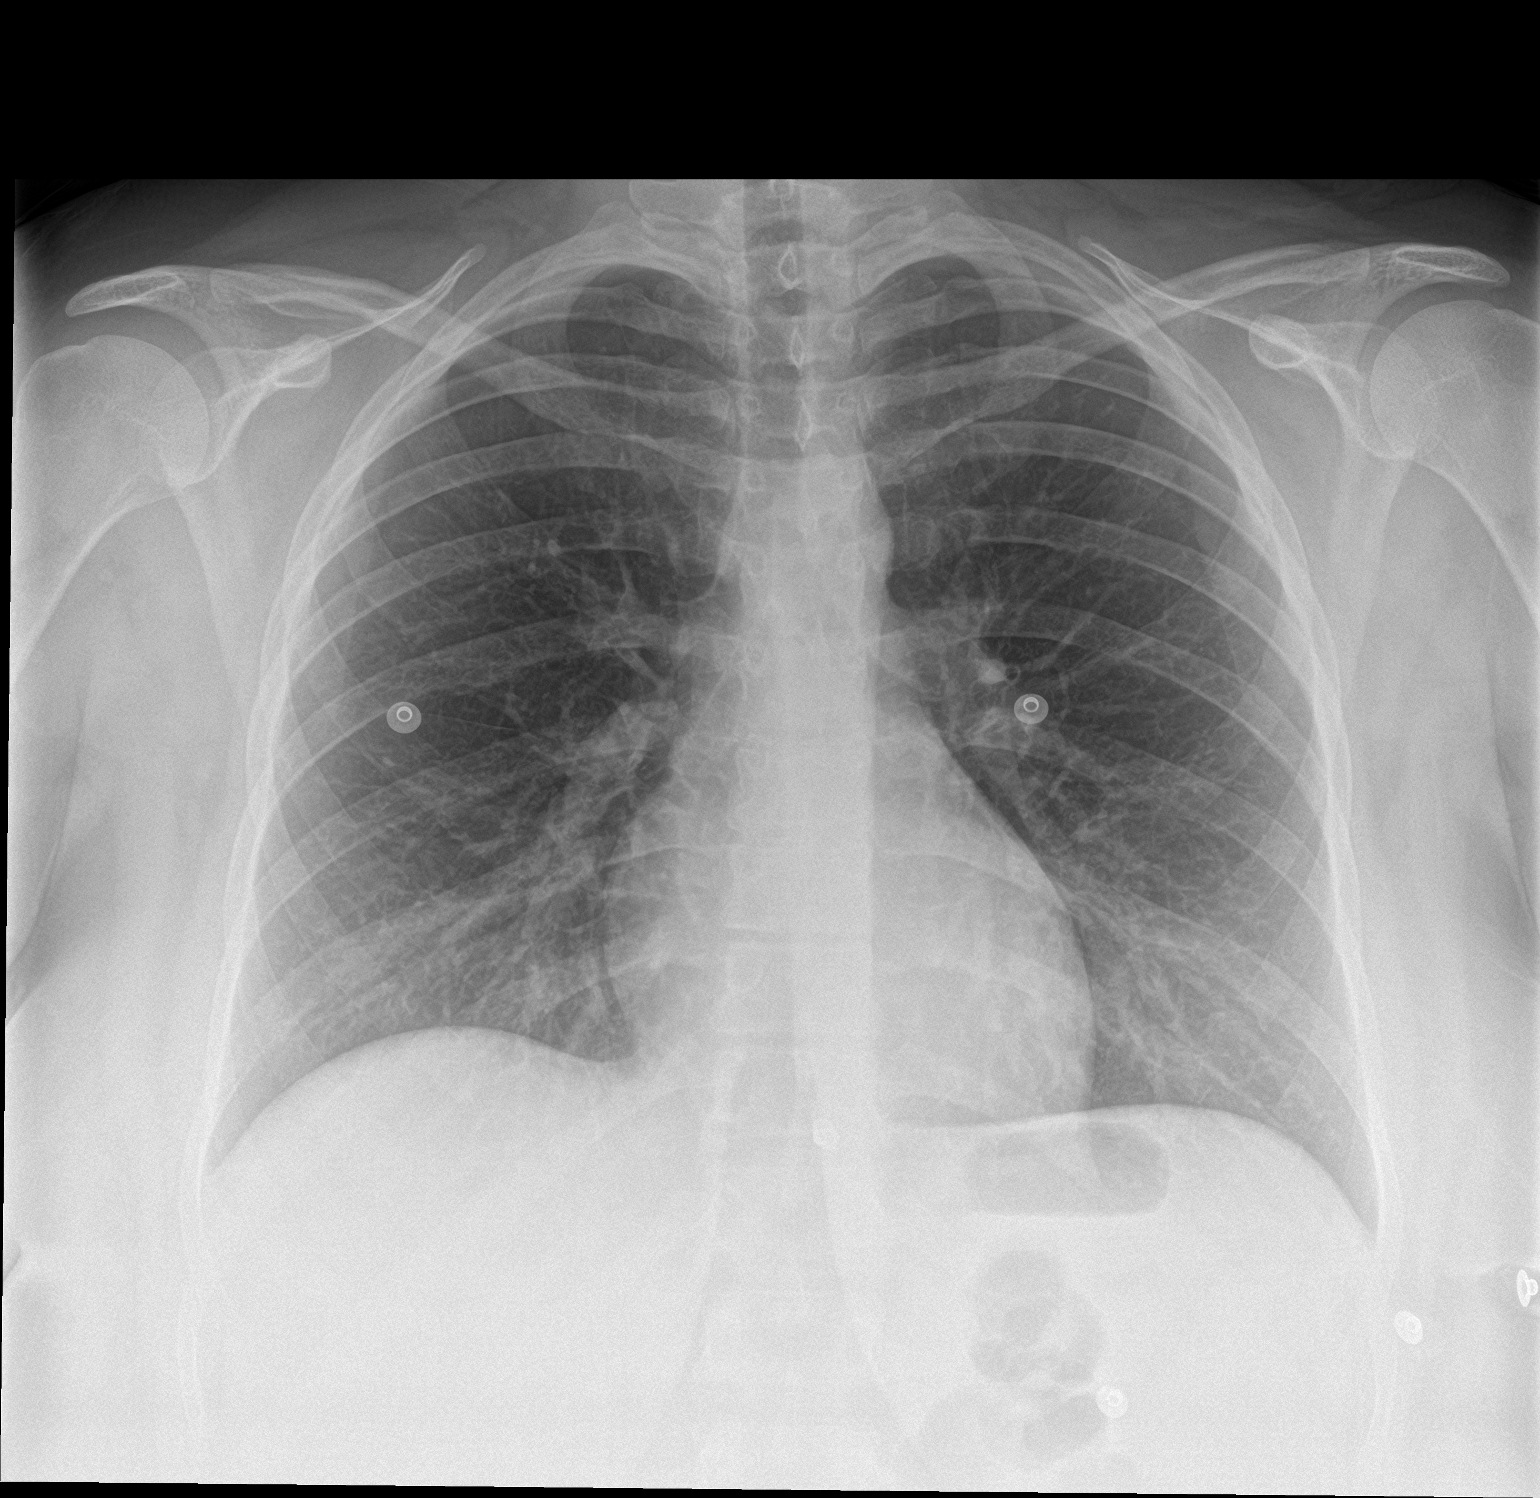

[chest lat]
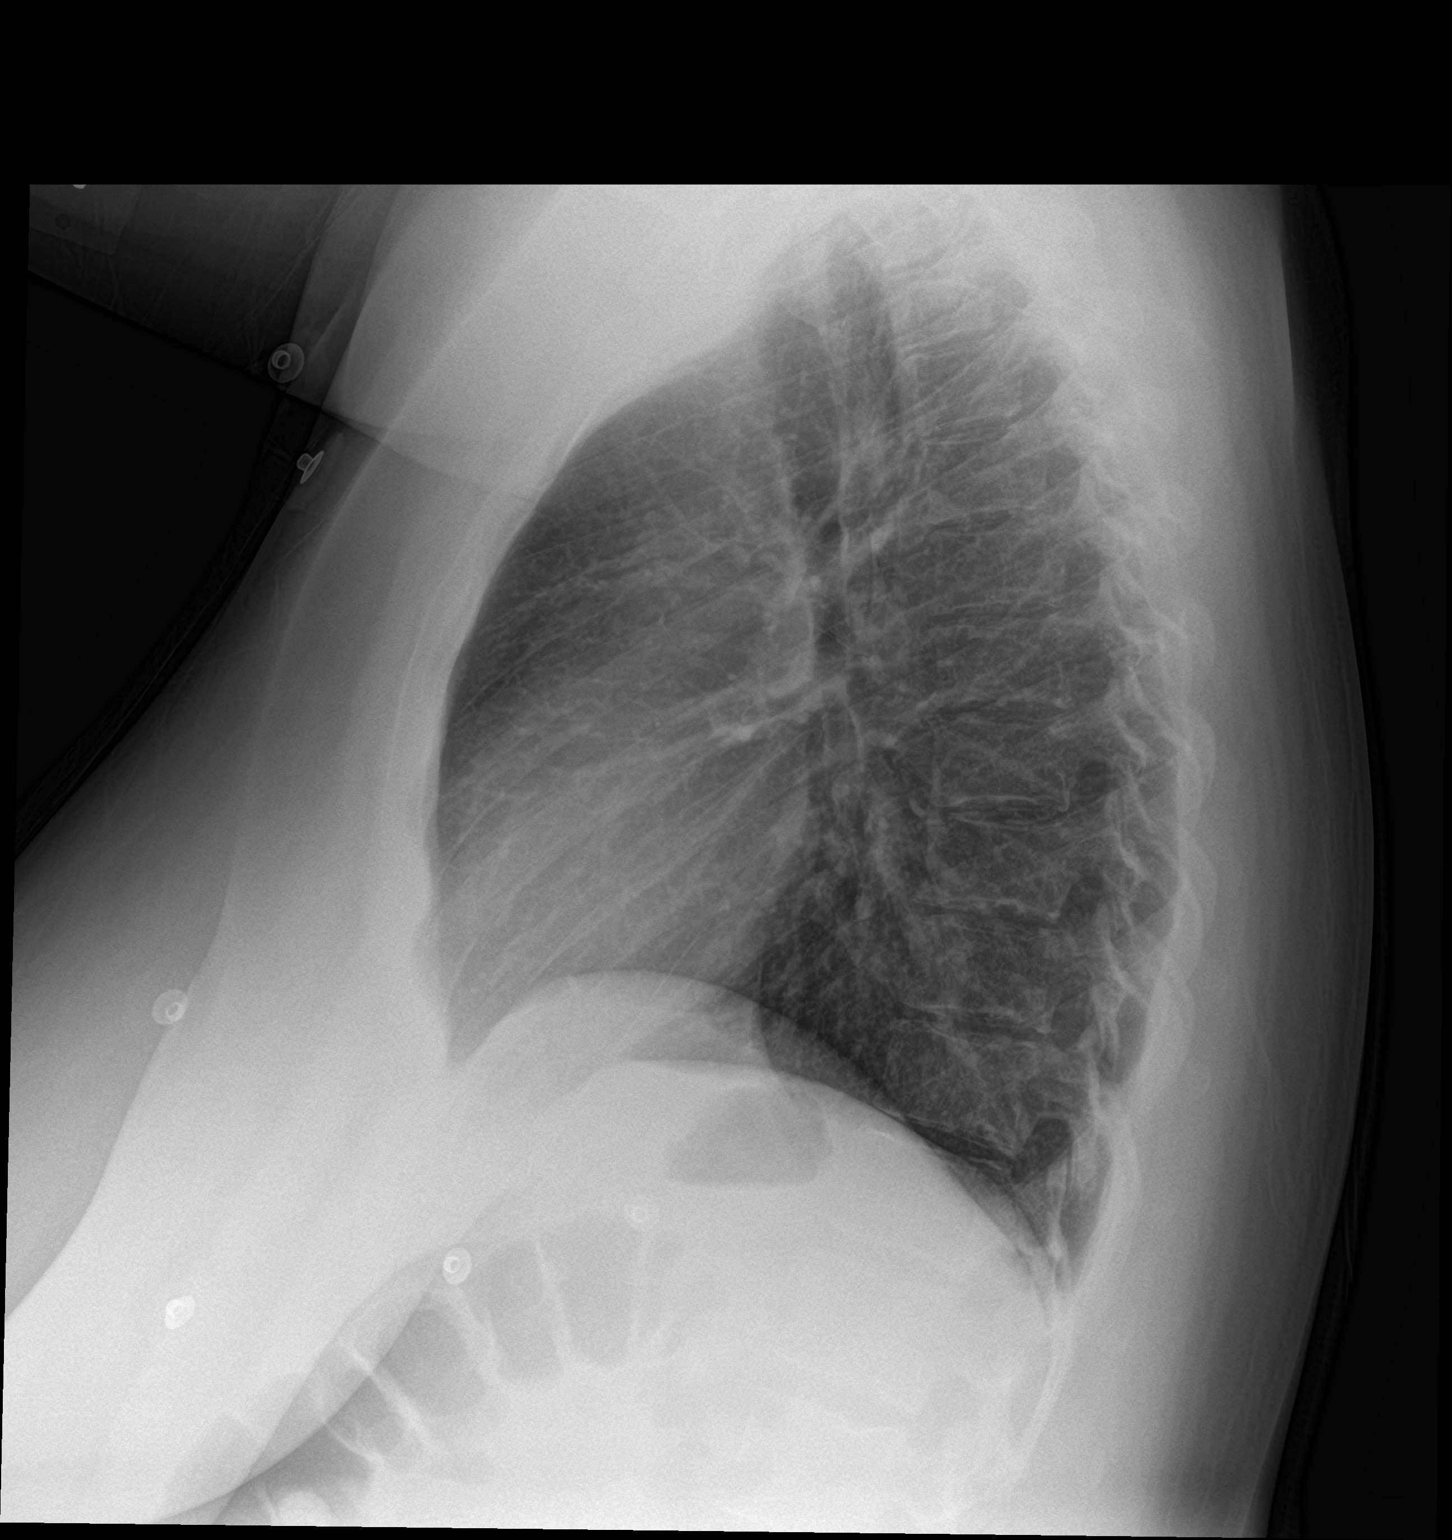

[2 of 2 positions shown; findings below may reference images not displayed]

FINDINGS: The heart size and mediastinal contours are within normal limits.
Both lungs are clear. The visualized skeletal structures are
unremarkable.
IMPRESSION: No active cardiopulmonary disease.

## 2019-05-04 ENCOUNTER — Other Ambulatory Visit: Payer: Self-pay

## 2019-05-04 ENCOUNTER — Emergency Department
Admission: EM | Admit: 2019-05-04 | Discharge: 2019-05-04 | Disposition: A | Discharge status: Home / Self Care | Payer: 59 | Attending: Emergency Medicine | Admitting: Emergency Medicine

## 2019-05-04 DIAGNOSIS — K047 Periapical abscess without sinus: Secondary | ICD-10-CM | POA: Diagnosis not present

## 2019-05-04 DIAGNOSIS — Z79899 Other long term (current) drug therapy: Secondary | ICD-10-CM | POA: Diagnosis not present

## 2019-05-04 DIAGNOSIS — J029 Acute pharyngitis, unspecified: Secondary | ICD-10-CM | POA: Diagnosis present

## 2019-05-04 MED ORDER — IBUPROFEN 600 MG PO TABS: 600.0000 mg | ORAL_TABLET | Freq: Three times a day (TID) | ORAL | 0 refills | Status: DC | PRN

## 2019-05-04 MED ORDER — DIPHENHYDRAMINE HCL 12.5 MG/5ML PO ELIX
12.5000 mg | ORAL_SOLUTION | Freq: Once | ORAL | Status: AC
  Administered 2019-05-04: 12:00:00 12.5 mg via ORAL
  Filled 2019-05-04: qty 5

## 2019-05-04 MED ORDER — TRAMADOL HCL 50 MG PO TABS: 50.0000 mg | ORAL_TABLET | Freq: Four times a day (QID) | ORAL | 0 refills | Status: DC | PRN

## 2019-05-04 MED ORDER — LIDOCAINE VISCOUS HCL 2 % MT SOLN
15.0000 mL | Freq: Once | OROMUCOSAL | Status: AC
  Administered 2019-05-04: 15 mL via OROMUCOSAL
  Filled 2019-05-04: qty 15

## 2019-05-04 MED ORDER — AMOXICILLIN 500 MG PO CAPS: 500.0000 mg | ORAL_CAPSULE | Freq: Three times a day (TID) | ORAL | 0 refills | Status: DC

## 2019-05-04 MED ORDER — LIDOCAINE VISCOUS HCL 2 % MT SOLN: 5.0000 mL | Freq: Four times a day (QID) | OROMUCOSAL | 0 refills | Status: DC | PRN

## 2019-05-04 NOTE — ED Provider Notes (Signed)
[facility] Emergency Department Provider Note   ____________________________________________   First MD Initiated Contact with Patient 05/04/19 1143     (approximate)  I have reviewed the triage vital signs and the nursing notes.   HISTORY  Chief Complaint Sore Throat    HPI [name] is a 28 y.o. female patient presents presents with sore throat and dental pain for a week.  Patient states she has tried to follow-up with dentist but did not have the co-pay to be seen.  Patient state dental pain has progressed to a sore throat which she is having difficulty tolerating soft food.  Patient that she can tolerate fluids.  Patient says she is experiencing edema and pain to the right lower mandible area.  Patient rates the pain is 8/10.  Patient described the pain as "achy".  No palliative measure for complaint.         Past Medical History:  Diagnosis Date  . Abscess   . Anxiety   . Bipolar disorder (HCC)   . Depression   . Genital herpes     Patient Active Problem List   Diagnosis Date Noted  . SOB (shortness of breath) 05/30/2014  . Other chest pain 05/30/2014  . Rapid heart beat 05/30/2014  . Panic attacks 05/30/2014  . Anxiety 05/30/2014    Past Surgical History:  Procedure Laterality Date  . CERVICAL DISC SURGERY    . MOUTH SURGERY      Prior to Admission medications   Medication Sig Start Date End Date Taking? Authorizing Provider  amoxicillin (AMOXIL) 500 MG capsule Take 1 capsule (500 mg total) by mouth 3 (three) times daily. 05/04/19   Joni ReiningSmith, Leslea Vowles K, PA-C  DULoxetine (CYMBALTA) 30 MG capsule Take 3 capsules (90 mg total) by mouth at bedtime. 10/13/16 10/20/16  Nira Connardama, Pedro Eduardo, MD  ibuprofen (ADVIL) 600 MG tablet Take 1 tablet (600 mg total) by mouth every 8 (eight) hours as needed. 05/04/19   Joni ReiningSmith, Solange Emry K, PA-C  lidocaine (XYLOCAINE) 2 % solution Use as directed 5 mLs in the mouth or throat every 6 (six) hours as needed  for mouth pain. For swish and swallow. 05/04/19   Joni ReiningSmith, Jaylynn Siefert K, PA-C  QUEtiapine (SEROQUEL XR) 400 MG 24 hr tablet Take 1 tablet (400 mg total) by mouth at bedtime. 10/13/16 10/20/16  Nira Connardama, Pedro Eduardo, MD  traMADol (ULTRAM) 50 MG tablet Take 1 tablet (50 mg total) by mouth every 6 (six) hours as needed. 05/04/19 05/03/20  Joni ReiningSmith, Anaja Monts K, PA-C    Allergies Nickel  Family History  Problem Relation Age of Onset  . Hyperlipidemia Mother   . Hypertension Mother   . Hyperlipidemia Father     Social History Social History   Tobacco Use  . Smoking status: Never Smoker  . Smokeless tobacco: Never Used  Substance Use Topics  . Alcohol use: Yes    Comment: Rare.   . Drug use: No    Review of Systems Constitutional: No fever/chills Eyes: No visual changes. ENT: No sore throat. Cardiovascular: Denies chest pain. Respiratory: Denies shortness of breath. Gastrointestinal: No abdominal pain.  No nausea, no vomiting.  No diarrhea.  No constipation. Genitourinary: Negative for dysuria. Musculoskeletal: Negative for back pain. Skin: Negative for rash. Neurological: Negative for headaches, focal weakness or numbness. Psychiatric:  Anxiety, bipolar, and depression. Allergic/Immunilogical: Nickel ____________________________________________   PHYSICAL EXAM:  VITAL SIGNS: ED Triage Vitals  Enc Vitals Group     BP 05/04/19 1123 127/80  Pulse Rate 05/04/19 1119 (!) 105     Resp 05/04/19 1119 18     Temp 05/04/19 1119 98.3 F (36.8 C)     Temp Source 05/04/19 1119 Oral     SpO2 05/04/19 1119 100 %     Weight --      Height --      Head Circumference --      Peak Flow --      Pain Score 05/04/19 1122 8     Pain Loc --      Pain Edu? --      Excl. in [place]? --    Constitutional: Alert and oriented. Well appearing and in no acute distress. Mouth/Throat: Mucous membranes are moist.  Oropharynx non-erythematous.  Devitalized tooth #27.  Mild gingival edema. Neck: No stridor.    Hematological/Lymphatic/Immunilogical: No cervical lymphadenopathy. Cardiovascular: Normal rate, regular rhythm. Grossly normal heart sounds.  Good peripheral circulation. Respiratory: Normal respiratory effort.  No retractions. Lungs CTAB. Gastrointestinal: Soft and nontender. No distention. No abdominal bruits. No CVA tenderness. Musculoskeletal: No lower extremity tenderness nor edema.  No joint effusions. Neurologic:  Normal speech and language. No gross focal neurologic deficits are appreciated. No gait instability. Skin:  Skin is warm, dry and intact. No rash noted. Psychiatric: Mood and affect are normal. Speech and behavior are normal.  ____________________________________________   LABS (all labs ordered are listed, but only abnormal results are displayed)  Labs Reviewed - No data to display ____________________________________________  EKG   ____________________________________________  RADIOLOGY  ED MD interpretation:    Official radiology report(s): No results found.  ____________________________________________   PROCEDURES  Procedure(s) performed (including Critical Care):  Procedures   ____________________________________________   INITIAL IMPRESSION / ASSESSMENT AND PLAN / ED COURSE  As part of my medical decision making, I reviewed the following data within the [place] was evaluated in Emergency Department on 05/04/2019 for the symptoms described in the history of present illness. She was evaluated in the context of the global COVID-19 pandemic, which necessitated consideration that the patient might be at risk for infection with the SARS-CoV-2 virus that causes COVID-19. Institutional protocols and algorithms that pertain to the evaluation of patients at risk for COVID-19 are in a state of rapid change based on information released by regulatory bodies including the CDC and federal and state organizations.  These policies and algorithms were followed during the patient's care in the ED.   Patient presents with dental and throat pain secondary to a dental abscess.  Area of concern is nonfluctuant.  Devitalized teeth with mild gingival edema is apparent.  Patient given discharge care instruction list of dental clinics which she can try to obtain care.      ____________________________________________   FINAL CLINICAL IMPRESSION(S) / ED DIAGNOSES  Final diagnoses:  Dental abscess     ED Discharge Orders         Ordered    amoxicillin (AMOXIL) 500 MG capsule  3 times daily     05/04/19 1150    ibuprofen (ADVIL) 600 MG tablet  Every 8 hours PRN     05/04/19 1150    traMADol (ULTRAM) 50 MG tablet  Every 6 hours PRN     05/04/19 1150    lidocaine (XYLOCAINE) 2 % solution  Every 6 hours PRN     05/04/19 1200           Note:  This document was  prepared using Conservation officer, historic buildingsDragon voice recognition software and may include unintentional dictation errors.    Joni ReiningSmith, Zamariah Seaborn K, PA-C 05/04/19 1201    Sharyn CreamerQuale, Mark, MD 06/02/19 1028

## 2019-05-04 NOTE — Discharge Instructions (Addendum)
Follow-up from list of dental clinics: [facility] and [place] [email or web address] [phone])  [facility] [phone])  [facility] [phone])  [place] [phone])  [facility] [phone]) This clinic caters to the indigent population and is on a lottery system. Location: [employer] [facility], [place], [place], [place] Clinic Hours: Wednesdays from 6pm - 9pm, patients seen by a lottery system. For dates, call or go to [email or web address] Services: Cleanings, fillings and simple extractions. Payment Options: DENTAL WORK IS FREE OF CHARGE. Bring proof of income or support. Best way to get seen: Arrive at 5:15 pm - this is a lottery, NOT first come/first serve, so arriving earlier will not increase your chances of being seen.     [facility] [phone] Select option 1 for emergencies   Location: [facility], [place], [address] [place] Clinic Hours: No walk-ins accepted - call the day before to schedule an appointment. Check in times are 9:30 am and 1:30 pm. Services: Simple extractions, temporary fillings, pulpectomy/pulp debridement, uncomplicated abscess drainage. Payment Options: PAYMENT IS DUE AT THE TIME OF SERVICE.  Fee is usually $100-200, additional surgical procedures (e.g. abscess drainage) may be extra. Cash, checks, Visa/MasterCard accepted.  Can file Medicaid if patient is covered for dental - patient should call case worker to check. No discount for [facility] patients. Best way to get seen: MUST call the day before and get onto the schedule. Can usually be seen the next 1-2 days. No walk-ins accepted.     [place] [id number]   Location: [place], [place] Clinic Hours: M, W, Th, F 8am or 1:30pm, Tues 9a or 1:30 - first come/first served. Services: Simple extractions, temporary fillings, uncomplicated abscess drainage.  You do not need to be an [facility] resident. Payment Options: PAYMENT IS DUE AT THE TIME OF SERVICE. Dental insurance, otherwise sliding scale - bring proof of income or support. Depending on income and treatment needed, cost is usually $50-200. Best way to get seen: Arrive early as it is first come/first served.     [facility] [phone]   Location: [place] [facility] Hours: Mon-Thu 8a-5p Services: Most basic dental services including extractions and fillings. Payment Options: PAYMENT IS DUE AT THE TIME OF SERVICE. Sliding scale, up to 50% off - bring proof if income or support. Medicaid with dental option accepted. Best way to get seen: Call to schedule an appointment, can usually be seen within 2 weeks OR they will try to see walk-ins - show up at [place] or 2p (you may have to wait).     [facility] [place] RESIDENTS ONLY   Location: [facility], [place] [address], [place], [place] [address] Clinic Hours: By appointment only. Monday - Thursday 8am-5pm, Friday 8am-12pm Services: Cleanings, fillings, extractions. Payment Options: PAYMENT IS DUE AT THE TIME OF SERVICE. Cash, Visa or MasterCard. Sliding scale - $30 minimum per service. Best way to get seen: Come in to office, complete packet and make an appointment - need proof of income or support monies for each household member and proof of [facility] residence. Usually takes about a month to get in.     [facility] [phone]  Location: [address], [place] Clinic Hours: Walk-in Urgent Care Dental Services  are offered Monday-Friday mornings only. The numbers of emergencies accepted daily is limited to the number of providers available. Maximum 15 - Mondays, Wednesdays & Thursdays Maximum 10 - Tuesdays & Fridays Services: You do not need to be a [facility] resident to be seen for a dental emergency. Emergencies are defined as pain, swelling, abnormal bleeding, or dental trauma. Walkins will receive x-rays if needed. NOTE: Dental cleaning is not an emergency. Payment Options: PAYMENT IS DUE AT THE TIME OF SERVICE. Minimum co-pay is $40.00 for uninsured patients. Minimum co-pay is $3.00 for Medicaid with dental coverage. Dental Insurance is accepted and must be presented at time of visit. Medicare does not cover dental. Forms of payment: Cash, credit card, checks. Best way to get seen: If not previously registered with the clinic, walk-in dental registration begins at 7:15 am and is on a first come/first serve basis. If previously registered with the clinic, call to make an appointment.     [facility] [place] ONLY   Location: [address], [place], [place] Clinic Hours: Mon-Thu 10a-2p Services: Extractions only! Payment Options: FREE (donations accepted) - bring proof of income or support Best way to get seen: Call and schedule an appointment OR come at 8am on the 1st Monday of every month (except for holidays) when it is first come/first served.     [facility] [phone]   Location: [place], [place] Clinic Hours: Friday mornings Services, Payment Options, Best way to get seen: Call for info

## 2019-05-04 NOTE — ED Triage Notes (Signed)
Pt c/o sore throat for the past week. 

## 2019-05-04 NOTE — ED Notes (Signed)
See triage note  States she developed a sore throat about 1 week ago    States pain and swelling is mainly on the right  Afebrile on arrival

## 2019-10-08 ENCOUNTER — Emergency Department: Payer: 59

## 2019-10-08 ENCOUNTER — Emergency Department
Admission: EM | Admit: 2019-10-08 | Discharge: 2019-10-08 | Disposition: A | Discharge status: Home / Self Care | Payer: 59 | Attending: Emergency Medicine | Admitting: Emergency Medicine

## 2019-10-08 ENCOUNTER — Other Ambulatory Visit: Payer: Self-pay

## 2019-10-08 DIAGNOSIS — M545 Low back pain: Secondary | ICD-10-CM | POA: Diagnosis present

## 2019-10-08 DIAGNOSIS — W182XXA Fall in (into) shower or empty bathtub, initial encounter: Secondary | ICD-10-CM | POA: Diagnosis not present

## 2019-10-08 DIAGNOSIS — M5441 Lumbago with sciatica, right side: Secondary | ICD-10-CM | POA: Diagnosis not present

## 2019-10-08 DIAGNOSIS — M5442 Lumbago with sciatica, left side: Secondary | ICD-10-CM | POA: Diagnosis not present

## 2019-10-08 DIAGNOSIS — Z79899 Other long term (current) drug therapy: Secondary | ICD-10-CM | POA: Diagnosis not present

## 2019-10-08 LAB — URINALYSIS, COMPLETE (UACMP) WITH MICROSCOPIC
Bacteria, UA: NONE SEEN
Bilirubin Urine: NEGATIVE
Glucose, UA: NEGATIVE mg/dL
Hgb urine dipstick: NEGATIVE
Ketones, ur: NEGATIVE mg/dL
Leukocytes,Ua: NEGATIVE
Nitrite: NEGATIVE
Protein, ur: NEGATIVE mg/dL
Specific Gravity, Urine: 1.001 — ABNORMAL LOW (ref 1.005–1.030)
WBC, UA: NONE SEEN WBC/hpf (ref 0–5)
pH: 7 (ref 5.0–8.0)

## 2019-10-08 LAB — POCT PREGNANCY, URINE: Preg Test, Ur: NEGATIVE

## 2019-10-08 MED ORDER — DEXAMETHASONE SODIUM PHOSPHATE 10 MG/ML IJ SOLN
10.0000 mg | Freq: Once | INTRAMUSCULAR | Status: AC
  Administered 2019-10-08: 10 mg via INTRAMUSCULAR
  Filled 2019-10-08: qty 1

## 2019-10-08 MED ORDER — METHOCARBAMOL 500 MG PO TABS: 500.0000 mg | ORAL_TABLET | Freq: Every evening | ORAL | 0 refills | Status: DC | PRN

## 2019-10-08 MED ORDER — NAPROXEN 500 MG PO TABS: 500.0000 mg | ORAL_TABLET | Freq: Two times a day (BID) | ORAL | 0 refills | Status: DC

## 2019-10-08 MED ORDER — METHOCARBAMOL 500 MG PO TABS
500.0000 mg | ORAL_TABLET | Freq: Once | ORAL | Status: AC
  Administered 2019-10-08: 500 mg via ORAL
  Filled 2019-10-08: qty 1

## 2019-10-08 NOTE — ED Provider Notes (Signed)
[facility] Emergency Department Provider Note ____________________________________________  Time seen: 2000  I have reviewed the triage vital signs and the nursing notes.  HISTORY  Chief Complaint  Fall   HPI [name] is a 28 y.o. female presents to the clinic today with c/o low back pain. This started 4 days ago after falling in the shower. She reports she bent over to shave her legs and her knees just buckled. She describes the pain as sore, achy and tight. The pain can be sharp with movement. Sitting for long periods of time makes the pain worse. She has some tingling in her bilateral thighs but denies numbness, weakness, loss of bladder or bowel control. She has pain with urination and having a BM but thinks it is the muscles around the spine. She denies urgency, frequency, dysuria, fever, chills or nausea. She denies prior injury to the low back. She has taken Aleve with minimal relief.  Past Medical History:  Diagnosis Date  . Abscess   . Anxiety   . Bipolar disorder ([place])   . Depression   . Genital herpes     Patient Active Problem List   Diagnosis Date Noted  . SOB (shortness of breath) 05/30/2014  . Other chest pain 05/30/2014  . Rapid heart beat 05/30/2014  . Panic attacks 05/30/2014  . Anxiety 05/30/2014    Past Surgical History:  Procedure Laterality Date  . CERVICAL DISC SURGERY    . MOUTH SURGERY      Prior to Admission medications   Medication Sig Start Date End Date Taking? Authorizing Provider  amoxicillin (AMOXIL) 500 MG capsule Take 1 capsule (500 mg total) by mouth 3 (three) times daily. 05/04/19   Sable Feil, PA-C  DULoxetine (CYMBALTA) 30 MG capsule Take 3 capsules (90 mg total) by mouth at bedtime. 10/13/16 10/20/16  Fatima Blank, MD  ibuprofen (ADVIL) 600 MG tablet Take 1 tablet (600 mg total) by mouth every 8 (eight) hours as needed. 05/04/19   Sable Feil, PA-C  lidocaine (XYLOCAINE) 2 % solution Use  as directed 5 mLs in the mouth or throat every 6 (six) hours as needed for mouth pain. For swish and swallow. 05/04/19   Sable Feil, PA-C  methocarbamol (ROBAXIN) 500 MG tablet Take 1 tablet (500 mg total) by mouth at bedtime as needed for muscle spasms. 10/08/19   Jearld Fenton, NP  naproxen (NAPROSYN) 500 MG tablet Take 1 tablet (500 mg total) by mouth 2 (two) times daily with a meal. 10/08/19 10/07/20  Jearld Fenton, NP  QUEtiapine (SEROQUEL XR) 400 MG 24 hr tablet Take 1 tablet (400 mg total) by mouth at bedtime. 10/13/16 10/20/16  Fatima Blank, MD  traMADol (ULTRAM) 50 MG tablet Take 1 tablet (50 mg total) by mouth every 6 (six) hours as needed. 05/04/19 05/03/20  Sable Feil, PA-C    Allergies Nickel  Family History  Problem Relation Age of Onset  . Hyperlipidemia Mother   . Hypertension Mother   . Hyperlipidemia Father     Social History Social History   Tobacco Use  . Smoking status: Never Smoker  . Smokeless tobacco: Never Used  Substance Use Topics  . Alcohol use: Yes    Comment: Rare.   . Drug use: No    Review of Systems  Constitutional: Negative for fever, chills or body aches. Cardiovascular: Negative for chest pain or chest tightness. Respiratory: Negative for cough, shortness of breath. Gastrointestinal: Negative for  abdominal pain, nausea, vomiting and diarrhea or loss of bowel control. Genitourinary: Negative for urgency, frequency, dysuria or loss of bladder control. Musculoskeletal: Positive for low back pain. Skin: Negative for rash. Neurological: Positive for tingling. Negative for focal weakness, tingling or numbness. ____________________________________________  PHYSICAL EXAM:  VITAL SIGNS: ED Triage Vitals  Enc Vitals Group     BP 10/08/19 1936 (!) 146/97     Pulse Rate 10/08/19 1936 (!) 107     Resp 10/08/19 1936 18     Temp 10/08/19 1936 98.2 F (36.8 C)     Temp Source 10/08/19 1936 Oral     SpO2 10/08/19 1936 98 %      Weight 10/08/19 1937 210 lb (95.3 kg)     Height 10/08/19 1937 5\' 3"  (1.6 m)     Head Circumference --      Peak Flow --      Pain Score 10/08/19 1937 9     Pain Loc --      Pain Edu? --      Excl. in GC? --     Constitutional: Alert and oriented. Obese, appears uncomfortable but in no distress. Cardiovascular: Tachcardic, regular rhythm. Pedal pulses 2+  Respiratory: Normal respiratory effort. No wheezes/rales/rhonchi. Gastrointestinal: Soft and nontender. No CVA tenderness noted. Musculoskeletal: Pain with flexion, rotation and lateral bending. Normal extension. Pain with palpation over the lumbar spine and bilateral paralumbar muscles. Strength 5/5 BLE. Able to stand on heels and toes.  Neurologic:  Normal speech and language. No gross focal neurologic deficits are appreciated. Positive SLR bilaterally. Skin:  Skin is warm, dry and intact. No rash noted.  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  Mild degenerative disc disease with tiny anterior osteophytes.    ____________________________________________    INITIAL IMPRESSION / ASSESSMENT AND PLAN / ED COURSE  Acute Midline Low Back Pain with Bilateral Sciatica:  Decadron 10 mg IM x 1 Methocarbamol 500 mg PO x 1 Xray lumbar spine negative for acute findings RX for Naproxen 500 mg BID x 7 days RX for Methocarbamol 500 mg PO QHS- sedation caution given Encouraged ice and stretching Work note provided Return precautions discussed   ____________________________________________  FINAL CLINICAL IMPRESSION(S) / ED DIAGNOSES  Final diagnoses:  Acute midline low back pain with bilateral sciatica   10/10/19, NP    Nicki Reaper, NP 10/08/19 2042    2043, MD 10/08/19 [phone]

## 2019-10-08 NOTE — ED Notes (Signed)
Pt reports fall on Thursday with "tingling sensation" that stops mid-thigh in BLE that is "sporatic". Reports back pain in lower region. Reports pain with going to the bathroom, "my muscles hurt when I try to go to the bathroom".  Pt ambulatory to treatment room, limping noted, appears to favor right leg.

## 2019-10-08 NOTE — ED Triage Notes (Signed)
Patient reports back spasm resulting in fall in the shower on Thursday. patient reports continued pain since.   Patient also c/o pain with urination.

## 2019-10-08 NOTE — Discharge Instructions (Signed)
You were seen today for low back pain. You have been diagnosed with sciatica. Your xray does not show any acute findings. I have sent in a RX for antiinflammatories. Take as prescribed with food. Avoid all other antiinflammatories OTC. You can take Tylenol 650 mg every 8 hours as needed. I have also sent in a RX for muscle relaxers. These may cause sedation. Ice and stretching should help improve your pain.

## 2020-03-22 ENCOUNTER — Other Ambulatory Visit: Payer: Self-pay

## 2020-03-22 ENCOUNTER — Emergency Department
Admission: EM | Admit: 2020-03-22 | Discharge: 2020-03-22 | Disposition: A | Discharge status: Home / Self Care | Payer: Medicaid Other | Attending: Emergency Medicine | Admitting: Emergency Medicine

## 2020-03-22 DIAGNOSIS — N3000 Acute cystitis without hematuria: Secondary | ICD-10-CM

## 2020-03-22 DIAGNOSIS — R300 Dysuria: Secondary | ICD-10-CM | POA: Diagnosis present

## 2020-03-22 LAB — URINALYSIS, ROUTINE W REFLEX MICROSCOPIC
Bacteria, UA: NONE SEEN
Bilirubin Urine: NEGATIVE
Glucose, UA: NEGATIVE mg/dL
Hgb urine dipstick: NEGATIVE
Ketones, ur: NEGATIVE mg/dL
Nitrite: NEGATIVE
Protein, ur: NEGATIVE mg/dL
Specific Gravity, Urine: 1.019 (ref 1.005–1.030)
pH: 6 (ref 5.0–8.0)

## 2020-03-22 LAB — POCT PREGNANCY, URINE: Preg Test, Ur: NEGATIVE

## 2020-03-22 LAB — WET PREP, GENITAL
Clue Cells Wet Prep HPF POC: NONE SEEN
Sperm: NONE SEEN
Trich, Wet Prep: NONE SEEN
Yeast Wet Prep HPF POC: NONE SEEN

## 2020-03-22 LAB — CHLAMYDIA/NGC RT PCR (ARMC ONLY)
Chlamydia Tr: NOT DETECTED
N gonorrhoeae: NOT DETECTED

## 2020-03-22 LAB — PREGNANCY, URINE: Preg Test, Ur: NEGATIVE

## 2020-03-22 MED ORDER — AZITHROMYCIN 500 MG PO TABS
1000.0000 mg | ORAL_TABLET | Freq: Once | ORAL | Status: AC
  Administered 2020-03-22: 1000 mg via ORAL
  Filled 2020-03-22: qty 2

## 2020-03-22 MED ORDER — DOXYCYCLINE HYCLATE 100 MG PO CAPS: 100.0000 mg | ORAL_CAPSULE | Freq: Two times a day (BID) | ORAL | 0 refills | Status: DC

## 2020-03-22 NOTE — Discharge Instructions (Addendum)
Your urine culture results should be back in 2 to 3 days.  If your antibiotic is not appropriate for infection a nurse will call you and change the antibiotic.  If your gonorrhea/chlamydia test returns positive they will notify.  If it is positive your partner will also need to be treated.

## 2020-03-22 NOTE — ED Triage Notes (Signed)
Pt states 3-4 days or dysuria

## 2020-03-22 NOTE — ED Provider Notes (Signed)
[facility] Emergency Department Provider Note  ____________________________________________   First MD Initiated Contact with Patient 03/22/20 1730     (approximate)  I have reviewed the triage vital signs and the nursing notes.   HISTORY  Chief Complaint Dysuria    HPI [name] is a 29 y.o. female presents emergency department complaining of dysuria for 3 to 4 days.  Patient has a history of pyelonephritis admitted.  She is concerned that that she get worse quickly.  She was seen for UTI with her regular doctors back in April and placed on amoxicillin for beta strep that was noted in her urine culture.  She denies fever or chills.  She denies flank pain this time.  No vaginal discharge but does want to be tested    Past Medical History:  Diagnosis Date   Abscess    Anxiety    Bipolar disorder ([facility])    Depression    Genital herpes     Patient Active Problem List   Diagnosis Date Noted   SOB (shortness of breath) 05/30/2014   Other chest pain 05/30/2014   Rapid heart beat 05/30/2014   Panic attacks 05/30/2014   Anxiety 05/30/2014    Past Surgical History:  Procedure Laterality Date   CERVICAL DISC SURGERY     MOUTH SURGERY      Prior to Admission medications   Medication Sig Start Date End Date Taking? Authorizing Provider  doxycycline (VIBRAMYCIN) 100 MG capsule Take 1 capsule (100 mg total) by mouth 2 (two) times daily. 03/22/20   Barnet Benavides, Roselyn Bering, PA-C  DULoxetine (CYMBALTA) 30 MG capsule Take 3 capsules (90 mg total) by mouth at bedtime. 10/13/16 10/20/16  Nira Conn, MD  QUEtiapine (SEROQUEL XR) 400 MG 24 hr tablet Take 1 tablet (400 mg total) by mouth at bedtime. 10/13/16 10/20/16  Nira Conn, MD    Allergies Nickel  Family History  Problem Relation Age of Onset   Hyperlipidemia Mother    Hypertension Mother    Hyperlipidemia Father     Social History Social History   Tobacco Use     Smoking status: Never Smoker   Smokeless tobacco: Never Used  Substance Use Topics   Alcohol use: Yes    Comment: Rare.    Drug use: No    Review of Systems  Constitutional: No fever/chills Eyes: No visual changes. ENT: No sore throat. Respiratory: Denies cough Cardiovascular: Denies chest pain Gastrointestinal: Denies abdominal pain Genitourinary: Positive for dysuria. Musculoskeletal: Negative for back pain. Skin: Negative for rash. Psychiatric: no mood changes,     ____________________________________________   PHYSICAL EXAM:  VITAL SIGNS: ED Triage Vitals  Enc Vitals Group     BP 03/22/20 1706 (!) 131/94     Pulse Rate 03/22/20 1706 95     Resp 03/22/20 1706 16     Temp 03/22/20 1706 98.2 F (36.8 C)     Temp Source 03/22/20 1706 Oral     SpO2 03/22/20 1706 99 %     Weight 03/22/20 1707 205 lb (93 kg)     Height 03/22/20 1707 5\' 3"  (1.6 m)     Head Circumference --      Peak Flow --      Pain Score 03/22/20 1706 7     Pain Loc --      Pain Edu? --      Excl. in GC? --     Constitutional: Alert and oriented. Well appearing and in  no acute distress. Eyes: Conjunctivae are normal.  Head: Atraumatic. Nose: No congestion/rhinnorhea. Mouth/Throat: Mucous membranes are moist.   Neck:  supple no lymphadenopathy noted Cardiovascular: Normal rate, regular rhythm. Heart sounds are normal Respiratory: Normal respiratory effort.  No retractions, lungs c t a  Abd: soft nontender bs normal all 4 quad GU: No herpetic lesions noted, speculum exam shows a small amount of discharge, no cervical tenderness noted Musculoskeletal: FROM all extremities, warm and well perfused Neurologic:  Normal speech and language.  Skin:  Skin is warm, dry and intact. No rash noted. Psychiatric: Mood and affect are normal. Speech and behavior are normal.  ____________________________________________   LABS (all labs ordered are listed, but only abnormal results are  displayed)  Labs Reviewed  WET PREP, GENITAL - Abnormal; Notable for the following components:      Result Value   WBC, Wet Prep HPF POC FEW (*)    All other components within normal limits  URINALYSIS, ROUTINE W REFLEX MICROSCOPIC - Abnormal; Notable for the following components:   Color, Urine YELLOW (*)    APPearance HAZY (*)    Leukocytes,Ua SMALL (*)    All other components within normal limits  CHLAMYDIA/NGC RT PCR ([facility] ONLY)  URINE CULTURE  PREGNANCY, URINE  POCT PREGNANCY, URINE   ____________________________________________   ____________________________________________  RADIOLOGY    ____________________________________________   PROCEDURES  Procedure(s) performed: No  Procedures    ____________________________________________   INITIAL IMPRESSION / ASSESSMENT AND PLAN / ED COURSE  Pertinent labs & imaging results that were available during my care of the patient were reviewed by me and considered in my medical decision making (see chart for details).   Patient presents emergency department with 3 to 4 days of dysuria.  See HPI.  Physical exam patient appears to be stable.  Pelvic exam did not show any concerns for herpetic lesions or cervicitis at this time.  Wet prep shows few WBCs, urinalysis has small amount of leuks with no bacteria, pregnancy test is negative, urine culture and GC/Chlamydia are pending  Due to the no bacteria being seen in the urine I will treat her for chlamydia, she was given Zithromax 1 g p.o. here in the ED, doxycycline 100 mg twice daily for 10 days.  She is to follow-up with [facility].  Return if worsening.    [name] was evaluated in Emergency Department on 03/22/2020 for the symptoms described in the history of present illness. She was evaluated in the context of the global COVID-19 pandemic, which necessitated consideration that the patient might be at risk for infection with the  SARS-CoV-2 virus that causes COVID-19. Institutional protocols and algorithms that pertain to the evaluation of patients at risk for COVID-19 are in a state of rapid change based on information released by regulatory bodies including the CDC and federal and state organizations. These policies and algorithms were followed during the patient's care in the ED.   As part of my medical decision making, I reviewed the following data within the [place] notes reviewed and incorporated, Labs reviewed , Old chart reviewed, Notes from prior ED visits and [place] Controlled Substance Database  ____________________________________________   FINAL CLINICAL IMPRESSION(S) / ED DIAGNOSES  Final diagnoses:  Acute cystitis without hematuria      NEW MEDICATIONS STARTED DURING THIS VISIT:  Current Discharge Medication List    START taking these medications   Details  doxycycline (VIBRAMYCIN) 100 MG capsule Take 1 capsule (  100 mg total) by mouth 2 (two) times daily. Qty: 42 capsule, Refills: 0         Note:  This document was prepared using Dragon voice recognition software and may include unintentional dictation errors.    Faythe Ghee, PA-C 03/22/20 1906    Arnaldo Natal, MD 03/22/20 2241

## 2020-03-23 LAB — URINE CULTURE: Culture: <10000 — AB

## 2021-05-29 DIAGNOSIS — S39012A Strain of muscle, fascia and tendon of lower back, initial encounter: Secondary | ICD-10-CM | POA: Insufficient documentation

## 2022-01-19 ENCOUNTER — Emergency Department (HOSPITAL_COMMUNITY)
Admission: EM | Admit: 2022-01-19 | Discharge: 2022-01-19 | Disposition: A | Discharge status: Home / Self Care | Payer: Medicaid Other | Attending: Emergency Medicine | Admitting: Emergency Medicine

## 2022-01-19 ENCOUNTER — Emergency Department (HOSPITAL_COMMUNITY): Payer: Medicaid Other

## 2022-01-19 ENCOUNTER — Other Ambulatory Visit: Payer: Self-pay

## 2022-01-19 DIAGNOSIS — S3992XA Unspecified injury of lower back, initial encounter: Secondary | ICD-10-CM | POA: Insufficient documentation

## 2022-01-19 DIAGNOSIS — M5442 Lumbago with sciatica, left side: Secondary | ICD-10-CM | POA: Diagnosis not present

## 2022-01-19 DIAGNOSIS — M5441 Lumbago with sciatica, right side: Secondary | ICD-10-CM | POA: Insufficient documentation

## 2022-01-19 DIAGNOSIS — Y92410 Unspecified street and highway as the place of occurrence of the external cause: Secondary | ICD-10-CM | POA: Diagnosis not present

## 2022-01-19 LAB — PREGNANCY, URINE: Preg Test, Ur: NEGATIVE

## 2022-01-19 MED ORDER — CYCLOBENZAPRINE HCL 10 MG PO TABS
5.0000 mg | ORAL_TABLET | Freq: Once | ORAL | Status: AC
  Administered 2022-01-19: 5 mg via ORAL
  Filled 2022-01-19: qty 1

## 2022-01-19 MED ORDER — LIDOCAINE 5 % EX PTCH
2.0000 | MEDICATED_PATCH | CUTANEOUS | Status: DC
  Administered 2022-01-19: 2 via TRANSDERMAL
  Filled 2022-01-19: qty 2

## 2022-01-19 MED ORDER — ACETAMINOPHEN 325 MG PO TABS
650.0000 mg | ORAL_TABLET | Freq: Once | ORAL | Status: AC
  Administered 2022-01-19: 650 mg via ORAL
  Filled 2022-01-19: qty 2

## 2022-01-19 MED ORDER — KETOROLAC TROMETHAMINE 60 MG/2ML IM SOLN
30.0000 mg | Freq: Once | INTRAMUSCULAR | Status: AC
  Administered 2022-01-19: 30 mg via INTRAMUSCULAR
  Filled 2022-01-19: qty 2

## 2022-01-19 NOTE — ED Notes (Signed)
;  pt urine sent to main lab @1319  ?
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
## 2022-01-19 NOTE — Discharge Instructions (Signed)
Keep your [facility] appointment for Friday.  ?
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
## 2022-03-18 ENCOUNTER — Other Ambulatory Visit: Payer: Self-pay | Admitting: Neurosurgery

## 2022-04-01 NOTE — Pre-Procedure Instructions (Signed)
Surgical Instructions    Your procedure is scheduled on Friday April 10, 2022.  Report to [facility] Main Entrance "A" at 6:30 A.M., then check in with the Admitting office.  Call this number if you have problems the morning of surgery:  [phone]   If you have any questions prior to your surgery date call [phone]: Open Monday-Friday 8am-4pm    Remember:  Do not eat after midnight the night before your surgery  You may drink clear liquids until 5:30 AM the morning of your surgery.   Clear liquids allowed are: Water, Non-Citrus Juices (without pulp), Carbonated Beverages, Clear Tea, Black Coffee Only (NO MILK, CREAM OR POWDERED CREAMER of any kind), and Gatorade.    Take these medicines the morning of surgery with A SIP OF WATER   busPIRone (BUSPAR)  As of today, STOP taking any Aspirin (unless otherwise instructed by your surgeon) Aleve, Naproxen, Ibuprofen, Motrin, Advil, Goody's, BC's, all herbal medications, fish oil, and all vitamins.                     Do NOT Smoke (Tobacco/Vaping) for 24 hours prior to your procedure.  If you use a CPAP at night, you may bring your mask/headgear for your overnight stay.   Contacts, glasses, piercing's, hearing aid's, dentures or partials may not be worn into surgery, please bring cases for these belongings.    For patients admitted to the hospital, discharge time will be determined by your treatment team.   Patients discharged the day of surgery will not be allowed to drive home, and someone needs to stay with them for 24 hours.  SURGICAL WAITING ROOM VISITATION Patients having surgery or a procedure may have two support people in the waiting room. These visitors may be switched out with other visitors if needed. Children under the age of 73 must have an adult accompany them who is not the patient. If the patient needs to stay at the hospital during part of their recovery, the visitor guidelines for inpatient rooms apply.  Please  refer to the [facility] website for the visitor guidelines for Inpatients (after your surgery is over and you are in a regular room).    Special instructions:   [facility]- Preparing For Surgery  Before surgery, you can play an important role. Because skin is not sterile, your skin needs to be as free of germs as possible. You can reduce the number of germs on your skin by washing with CHG (chlorahexidine gluconate) Soap before surgery.  CHG is an antiseptic cleaner which kills germs and bonds with the skin to continue killing germs even after washing.    Oral Hygiene is also important to reduce your risk of infection.  Remember - BRUSH YOUR TEETH THE MORNING OF SURGERY WITH YOUR REGULAR TOOTHPASTE  Please do not use if you have an allergy to CHG or antibacterial soaps. If your skin becomes reddened/irritated stop using the CHG.  Do not shave (including legs and underarms) for at least 48 hours prior to first CHG shower. It is OK to shave your face.  Please follow these instructions carefully.   Shower the NIGHT BEFORE SURGERY and the MORNING OF SURGERY  If you chose to wash your hair, wash your hair first as usual with your normal shampoo.  After you shampoo, rinse your hair and body thoroughly to remove the shampoo.  Use CHG Soap as you would any other liquid soap. You can apply CHG directly to the skin  and wash gently with a scrungie or a clean washcloth.   Apply the CHG Soap to your body ONLY FROM THE NECK DOWN.  Do not use on open wounds or open sores. Avoid contact with your eyes, ears, mouth and genitals (private parts). Wash Face and genitals (private parts)  with your normal soap.   Wash thoroughly, paying special attention to the area where your surgery will be performed.  Thoroughly rinse your body with warm water from the neck down.  DO NOT shower/wash with your normal soap after using and rinsing off the CHG Soap.  Pat yourself dry with a CLEAN TOWEL.  Wear CLEAN  PAJAMAS to bed the night before surgery  Place CLEAN SHEETS on your bed the night before your surgery  DO NOT SLEEP WITH PETS.   Day of Surgery: Take a shower with CHG soap. Do not wear jewelry or makeup Do not wear lotions, powders, perfumes/colognes, or deodorant. Do not shave 48 hours prior to surgery.  Men may shave face and neck. Do not bring valuables to the hospital.  [facility] is not responsible for any belongings or valuables. Do not wear nail polish, gel polish, artificial nails, or any other type of covering on natural nails (fingers and toes) If you have artificial nails or gel coating that need to be removed by a nail salon, please have this removed prior to surgery. Artificial nails or gel coating may interfere with anesthesia's ability to adequately monitor your vital signs. Wear Clean/Comfortable clothing the morning of surgery Remember to brush your teeth WITH YOUR REGULAR TOOTHPASTE.   Please read over the following fact sheets that you were given.    If you received a COVID test during your pre-op visit  it is requested that you wear a mask when out in public, stay away from anyone that may not be feeling well and notify your surgeon if you develop symptoms. If you have been in contact with anyone that has tested positive in the last 10 days please notify you surgeon.

## 2022-04-02 ENCOUNTER — Encounter (HOSPITAL_COMMUNITY)
Admission: RE | Admit: 2022-04-02 | Discharge: 2022-04-02 | Disposition: A | Discharge status: Home / Self Care | Payer: Medicaid Other | Source: Ambulatory Visit | Attending: Neurosurgery | Admitting: Neurosurgery

## 2022-04-02 ENCOUNTER — Other Ambulatory Visit: Payer: Self-pay

## 2022-04-02 ENCOUNTER — Encounter (HOSPITAL_COMMUNITY): Payer: Self-pay

## 2022-04-02 VITALS — BP 125/85 | HR 113 | Temp 98.0°F | Resp 18 | Ht 62.0 in | Wt 190.1 lb

## 2022-04-02 DIAGNOSIS — Z01818 Encounter for other preprocedural examination: Secondary | ICD-10-CM

## 2022-04-02 DIAGNOSIS — Z01812 Encounter for preprocedural laboratory examination: Secondary | ICD-10-CM | POA: Diagnosis present

## 2022-04-02 LAB — CBC
HCT: 43 % (ref 36.0–46.0)
Hemoglobin: 14.4 g/dL (ref 12.0–15.0)
MCH: 31.5 pg (ref 26.0–34.0)
MCHC: 33.5 g/dL (ref 30.0–36.0)
MCV: 94.1 fL (ref 80.0–100.0)
Platelets: 349 10*3/uL (ref 150–400)
RBC: 4.57 MIL/uL (ref 3.87–5.11)
RDW: 11.9 % (ref 11.5–15.5)
WBC: 9.1 10*3/uL (ref 4.0–10.5)
nRBC: 0 % (ref 0.0–0.2)

## 2022-04-02 NOTE — Progress Notes (Signed)
PCP - Pt goes to [facility] for primary care. She is looking for a PCP that takes her Medicaid. Cardiologist - denies  PPM/ICD - denies Device Orders - n/a Rep Notified - n/a  Chest x-ray - 01/17/2017 EKG - 02/16/2018 Stress Test - denies ECHO - denies Cardiac Cath - denies  Sleep Study - denies CPAP - n/a  No DM  Blood Thinner Instructions: n/a Aspirin Instructions: n/a  ERAS Protcol - Yes. Clear liquids until 0530 morning of surgery PRE-SURGERY Ensure or G2- n/a. Not ordered  COVID TEST- n/a   Anesthesia review: No.  Patient denies shortness of breath, fever, cough and chest pain at PAT appointment   All instructions explained to the patient, with a verbal understanding of the material. Patient agrees to go over the instructions while at home for a better understanding. Patient also instructed to self quarantine after being tested for COVID-19. The opportunity to ask questions was provided.

## 2022-04-09 NOTE — Anesthesia Preprocedure Evaluation (Signed)
Anesthesia Evaluation    Reviewed: Allergy & Precautions, Patient's Chart, lab work & pertinent test results  Airway        Dental   Pulmonary neg pulmonary ROS,           Cardiovascular negative cardio ROS       Neuro/Psych PSYCHIATRIC DISORDERS Anxiety Depression Bipolar Disorder negative neurological ROS     GI/Hepatic negative GI ROS, Neg liver ROS,   Endo/Other  negative endocrine ROS  Renal/GU negative Renal ROS     Musculoskeletal negative musculoskeletal ROS (+)   Abdominal (+) + obese,   Peds  Hematology negative hematology ROS (+)   Anesthesia Other Findings intervertebral disc displacement, Lumbar region  Reproductive/Obstetrics hcg negative                             Anesthesia Physical Anesthesia Plan  ASA: 2  Anesthesia Plan: General   Post-op Pain Management:    Induction: Intravenous  PONV Risk Score and Plan: 3 and Ondansetron, Dexamethasone, Midazolam and Treatment may vary due to age or medical condition  Airway Management Planned: Oral ETT  Additional Equipment:   Intra-op Plan:   Post-operative Plan: Extubation in OR  Informed Consent:   Plan Discussed with:   Anesthesia Plan Comments:         Anesthesia Quick Evaluation

## 2022-04-10 ENCOUNTER — Ambulatory Visit (HOSPITAL_COMMUNITY): Payer: Medicaid Other

## 2022-04-10 ENCOUNTER — Other Ambulatory Visit: Payer: Self-pay

## 2022-04-10 ENCOUNTER — Encounter (HOSPITAL_COMMUNITY): Payer: Self-pay | Admitting: Neurosurgery

## 2022-04-10 ENCOUNTER — Encounter (HOSPITAL_COMMUNITY)
Admission: RE | Disposition: A | Discharge status: Home / Self Care | Payer: Self-pay | Source: Home / Self Care | Attending: Neurosurgery

## 2022-04-10 ENCOUNTER — Ambulatory Visit (HOSPITAL_BASED_OUTPATIENT_CLINIC_OR_DEPARTMENT_OTHER): Payer: Medicaid Other | Admitting: Anesthesiology

## 2022-04-10 ENCOUNTER — Ambulatory Visit (HOSPITAL_COMMUNITY): Payer: Medicaid Other | Admitting: Anesthesiology

## 2022-04-10 ENCOUNTER — Observation Stay (HOSPITAL_COMMUNITY)
Admission: RE | Admit: 2022-04-10 | Discharge: 2022-04-11 | Disposition: A | Discharge status: Home / Self Care | Payer: Medicaid Other | Attending: Neurosurgery | Admitting: Neurosurgery

## 2022-04-10 DIAGNOSIS — M5126 Other intervertebral disc displacement, lumbar region: Secondary | ICD-10-CM

## 2022-04-10 DIAGNOSIS — Z01818 Encounter for other preprocedural examination: Secondary | ICD-10-CM

## 2022-04-10 HISTORY — PX: LUMBAR LAMINECTOMY/DECOMPRESSION MICRODISCECTOMY: SHX5026

## 2022-04-10 LAB — SURGICAL PCR SCREEN
MRSA, PCR: NEGATIVE
Staphylococcus aureus: NEGATIVE

## 2022-04-10 LAB — POCT PREGNANCY, URINE: Preg Test, Ur: NEGATIVE

## 2022-04-10 SURGERY — LUMBAR LAMINECTOMY/DECOMPRESSION MICRODISCECTOMY 2 LEVELS
Anesthesia: General

## 2022-04-10 MED ORDER — LIDOCAINE-EPINEPHRINE 0.5 %-1:200000 IJ SOLN
INTRAMUSCULAR | Status: AC
Start: 2022-04-10 — End: ?
  Filled 2022-04-10: qty 1

## 2022-04-10 MED ORDER — ONDANSETRON HCL 4 MG/2ML IJ SOLN
INTRAMUSCULAR | Status: DC | PRN
  Administered 2022-04-10: 4 mg via INTRAVENOUS

## 2022-04-10 MED ORDER — ONDANSETRON HCL 4 MG/2ML IJ SOLN
4.0000 mg | Freq: Four times a day (QID) | INTRAMUSCULAR | Status: DC | PRN
  Administered 2022-04-10: 4 mg via INTRAVENOUS

## 2022-04-10 MED ORDER — FENTANYL CITRATE (PF) 250 MCG/5ML IJ SOLN
INTRAMUSCULAR | Status: AC
  Filled 2022-04-10: qty 5

## 2022-04-10 MED ORDER — ACETAMINOPHEN 325 MG PO TABS: 650.0000 mg | ORAL_TABLET | ORAL | Status: DC | PRN

## 2022-04-10 MED ORDER — MIDAZOLAM HCL 2 MG/2ML IJ SOLN
INTRAMUSCULAR | Status: AC
  Filled 2022-04-10: qty 2

## 2022-04-10 MED ORDER — BUPIVACAINE HCL (PF) 0.5 % IJ SOLN
INTRAMUSCULAR | Status: DC | PRN
  Administered 2022-04-10: 30 mL

## 2022-04-10 MED ORDER — HYDROXYZINE HCL 50 MG/ML IM SOLN
50.0000 mg | Freq: Four times a day (QID) | INTRAMUSCULAR | Status: DC | PRN
  Administered 2022-04-10: 50 mg via INTRAMUSCULAR
  Filled 2022-04-10: qty 1

## 2022-04-10 MED ORDER — MIDAZOLAM HCL 5 MG/5ML IJ SOLN
INTRAMUSCULAR | Status: DC | PRN
  Administered 2022-04-10: 2 mg via INTRAVENOUS

## 2022-04-10 MED ORDER — KETAMINE HCL 10 MG/ML IJ SOLN
INTRAMUSCULAR | Status: DC | PRN
  Administered 2022-04-10 (×2): 5 mg via INTRAVENOUS
  Administered 2022-04-10: 20 mg via INTRAVENOUS

## 2022-04-10 MED ORDER — LACTATED RINGERS IV SOLN: INTRAVENOUS | Status: DC

## 2022-04-10 MED ORDER — ROCURONIUM BROMIDE 10 MG/ML (PF) SYRINGE
PREFILLED_SYRINGE | INTRAVENOUS | Status: DC | PRN
  Administered 2022-04-10 (×2): 40 mg via INTRAVENOUS

## 2022-04-10 MED ORDER — MORPHINE SULFATE (PF) 2 MG/ML IV SOLN
2.0000 mg | INTRAVENOUS | Status: DC | PRN
  Administered 2022-04-10: 2 mg via INTRAVENOUS
  Filled 2022-04-10: qty 1

## 2022-04-10 MED ORDER — DIAZEPAM 5 MG PO TABS
5.0000 mg | ORAL_TABLET | Freq: Four times a day (QID) | ORAL | Status: DC | PRN
  Administered 2022-04-10: 5 mg via ORAL
  Filled 2022-04-10: qty 1

## 2022-04-10 MED ORDER — DEXMEDETOMIDINE (PRECEDEX) IN NS 20 MCG/5ML (4 MCG/ML) IV SYRINGE
PREFILLED_SYRINGE | INTRAVENOUS | Status: DC | PRN
  Administered 2022-04-10: 12 ug via INTRAVENOUS

## 2022-04-10 MED ORDER — KETOROLAC TROMETHAMINE 30 MG/ML IJ SOLN
INTRAMUSCULAR | Status: AC
  Filled 2022-04-10: qty 1

## 2022-04-10 MED ORDER — KETOROLAC TROMETHAMINE 30 MG/ML IJ SOLN
30.0000 mg | Freq: Once | INTRAMUSCULAR | Status: AC | PRN
  Administered 2022-04-10: 30 mg via INTRAVENOUS

## 2022-04-10 MED ORDER — ORAL CARE MOUTH RINSE: 15.0000 mL | Freq: Once | OROMUCOSAL | Status: AC

## 2022-04-10 MED ORDER — OXYCODONE HCL 5 MG PO TABS: 5.0000 mg | ORAL_TABLET | Freq: Once | ORAL | Status: DC | PRN

## 2022-04-10 MED ORDER — ACETAMINOPHEN 650 MG RE SUPP: 650.0000 mg | RECTAL | Status: DC | PRN

## 2022-04-10 MED ORDER — AMISULPRIDE (ANTIEMETIC) 5 MG/2ML IV SOLN
10.0000 mg | Freq: Once | INTRAVENOUS | Status: AC | PRN
Start: 2022-04-10 — End: 2022-04-10
  Administered 2022-04-10: 10 mg via INTRAVENOUS

## 2022-04-10 MED ORDER — ACETAMINOPHEN 500 MG PO TABS
1000.0000 mg | ORAL_TABLET | Freq: Once | ORAL | Status: AC
  Administered 2022-04-10: 1000 mg via ORAL
  Filled 2022-04-10: qty 2

## 2022-04-10 MED ORDER — PHENOL 1.4 % MT LIQD
1.0000 | OROMUCOSAL | Status: DC | PRN
Start: 2022-04-10 — End: 2022-04-11

## 2022-04-10 MED ORDER — HYDROCODONE-ACETAMINOPHEN 5-325 MG PO TABS: 1.0000 | ORAL_TABLET | ORAL | Status: DC | PRN

## 2022-04-10 MED ORDER — FENTANYL CITRATE (PF) 100 MCG/2ML IJ SOLN
INTRAMUSCULAR | Status: AC
  Filled 2022-04-10: qty 2

## 2022-04-10 MED ORDER — FENTANYL CITRATE (PF) 100 MCG/2ML IJ SOLN
25.0000 ug | INTRAMUSCULAR | Status: DC | PRN
  Administered 2022-04-10 (×2): 25 ug via INTRAVENOUS
  Administered 2022-04-10 (×2): 50 ug via INTRAVENOUS

## 2022-04-10 MED ORDER — LIDOCAINE-EPINEPHRINE 0.5 %-1:200000 IJ SOLN
INTRAMUSCULAR | Status: DC | PRN
  Administered 2022-04-10: 50 mL

## 2022-04-10 MED ORDER — BUSPIRONE HCL 5 MG PO TABS
5.0000 mg | ORAL_TABLET | Freq: Two times a day (BID) | ORAL | Status: DC
  Administered 2022-04-10: 5 mg via ORAL
  Filled 2022-04-10 (×2): qty 1

## 2022-04-10 MED ORDER — QUETIAPINE FUMARATE ER 200 MG PO TB24
400.0000 mg | ORAL_TABLET | Freq: Every day | ORAL | Status: DC
  Administered 2022-04-10: 400 mg via ORAL
  Filled 2022-04-10: qty 2
  Filled 2022-04-10: qty 1

## 2022-04-10 MED ORDER — CEFAZOLIN SODIUM-DEXTROSE 2-4 GM/100ML-% IV SOLN
2.0000 g | INTRAVENOUS | Status: AC
  Administered 2022-04-10: 2 g via INTRAVENOUS
  Filled 2022-04-10: qty 100

## 2022-04-10 MED ORDER — HYDROMORPHONE HCL 1 MG/ML IJ SOLN
INTRAMUSCULAR | Status: DC | PRN
  Administered 2022-04-10: .5 mg via INTRAVENOUS

## 2022-04-10 MED ORDER — SUGAMMADEX SODIUM 500 MG/5ML IV SOLN
INTRAVENOUS | Status: DC | PRN
  Administered 2022-04-10: 300 mg via INTRAVENOUS

## 2022-04-10 MED ORDER — SODIUM CHLORIDE 0.9% FLUSH: 3.0000 mL | INTRAVENOUS | Status: DC | PRN

## 2022-04-10 MED ORDER — PROPOFOL 10 MG/ML IV BOLUS
INTRAVENOUS | Status: DC | PRN
  Administered 2022-04-10: 200 mg via INTRAVENOUS

## 2022-04-10 MED ORDER — ONDANSETRON HCL 4 MG/2ML IJ SOLN
INTRAMUSCULAR | Status: AC
  Filled 2022-04-10: qty 2

## 2022-04-10 MED ORDER — AMISULPRIDE (ANTIEMETIC) 5 MG/2ML IV SOLN
INTRAVENOUS | Status: AC
  Filled 2022-04-10: qty 4

## 2022-04-10 MED ORDER — LIDOCAINE 2% (20 MG/ML) 5 ML SYRINGE
INTRAMUSCULAR | Status: DC | PRN
  Administered 2022-04-10: 60 mg via INTRAVENOUS

## 2022-04-10 MED ORDER — HYDROCODONE-ACETAMINOPHEN 5-325 MG PO TABS
2.0000 | ORAL_TABLET | ORAL | Status: DC | PRN
  Administered 2022-04-10 – 2022-04-11 (×3): 2 via ORAL
  Filled 2022-04-10 (×3): qty 2

## 2022-04-10 MED ORDER — MENTHOL 3 MG MT LOZG: 1.0000 | LOZENGE | OROMUCOSAL | Status: DC | PRN

## 2022-04-10 MED ORDER — 0.9 % SODIUM CHLORIDE (POUR BTL) OPTIME
TOPICAL | Status: DC | PRN
  Administered 2022-04-10: 1000 mL

## 2022-04-10 MED ORDER — THROMBIN 5000 UNITS EX SOLR
CUTANEOUS | Status: DC | PRN
  Administered 2022-04-10: 5000 [IU] via TOPICAL

## 2022-04-10 MED ORDER — CHLORHEXIDINE GLUCONATE CLOTH 2 % EX PADS: 6.0000 | MEDICATED_PAD | Freq: Once | CUTANEOUS | Status: DC

## 2022-04-10 MED ORDER — KETOROLAC TROMETHAMINE 15 MG/ML IJ SOLN
15.0000 mg | Freq: Four times a day (QID) | INTRAMUSCULAR | Status: AC
  Administered 2022-04-10 – 2022-04-11 (×3): 15 mg via INTRAVENOUS
  Filled 2022-04-10 (×3): qty 1

## 2022-04-10 MED ORDER — KETAMINE HCL 50 MG/5ML IJ SOSY
PREFILLED_SYRINGE | INTRAMUSCULAR | Status: AC
  Filled 2022-04-10: qty 5

## 2022-04-10 MED ORDER — POTASSIUM CHLORIDE IN NACL 20-0.9 MEQ/L-% IV SOLN: INTRAVENOUS | Status: DC

## 2022-04-10 MED ORDER — SODIUM CHLORIDE 0.9 % IV SOLN
250.0000 mL | INTRAVENOUS | Status: DC
  Administered 2022-04-10: 250 mL via INTRAVENOUS

## 2022-04-10 MED ORDER — PROPOFOL 10 MG/ML IV BOLUS
INTRAVENOUS | Status: AC
  Filled 2022-04-10: qty 20

## 2022-04-10 MED ORDER — HYDROMORPHONE HCL 1 MG/ML IJ SOLN
INTRAMUSCULAR | Status: AC
  Filled 2022-04-10: qty 0.5

## 2022-04-10 MED ORDER — SODIUM CHLORIDE 0.9% FLUSH
3.0000 mL | Freq: Two times a day (BID) | INTRAVENOUS | Status: DC
  Administered 2022-04-10 (×2): 3 mL via INTRAVENOUS

## 2022-04-10 MED ORDER — CHLORHEXIDINE GLUCONATE 0.12 % MT SOLN
15.0000 mL | Freq: Once | OROMUCOSAL | Status: AC
  Administered 2022-04-10: 15 mL via OROMUCOSAL
  Filled 2022-04-10: qty 15

## 2022-04-10 MED ORDER — BUPIVACAINE HCL (PF) 0.5 % IJ SOLN
INTRAMUSCULAR | Status: AC
  Filled 2022-04-10: qty 30

## 2022-04-10 MED ORDER — ONDANSETRON HCL 4 MG PO TABS: 4.0000 mg | ORAL_TABLET | Freq: Four times a day (QID) | ORAL | Status: DC | PRN

## 2022-04-10 MED ORDER — HYDROCODONE-ACETAMINOPHEN 7.5-325 MG PO TABS
1.0000 | ORAL_TABLET | Freq: Four times a day (QID) | ORAL | Status: DC
  Administered 2022-04-10 (×2): 1 via ORAL
  Filled 2022-04-10 (×2): qty 1

## 2022-04-10 MED ORDER — THROMBIN 5000 UNITS EX SOLR
CUTANEOUS | Status: AC
  Filled 2022-04-10: qty 5000

## 2022-04-10 MED ORDER — OXYCODONE HCL 5 MG/5ML PO SOLN: 5.0000 mg | Freq: Once | ORAL | Status: DC | PRN

## 2022-04-10 MED ORDER — FENTANYL CITRATE (PF) 250 MCG/5ML IJ SOLN
INTRAMUSCULAR | Status: DC | PRN
  Administered 2022-04-10: 150 ug via INTRAVENOUS

## 2022-04-10 SURGICAL SUPPLY — 50 items
BAG COUNTER SPONGE SURGICOUNT (BAG) ×3 IMPLANT
BAND RUBBER #18 3X1/16 STRL (MISCELLANEOUS) ×4 IMPLANT
BENZOIN TINCTURE PRP APPL 2/3 (GAUZE/BANDAGES/DRESSINGS) IMPLANT
BLADE CLIPPER SURG (BLADE) IMPLANT
BUR MATCHSTICK NEURO 3.0 LAGG (BURR) ×2 IMPLANT
BUR PRECISION FLUTE 5.0 (BURR) IMPLANT
CANISTER SUCT 3000ML PPV (MISCELLANEOUS) ×2 IMPLANT
CARTRIDGE OIL MAESTRO DRILL (MISCELLANEOUS) ×1 IMPLANT
DERMABOND ADVANCED (GAUZE/BANDAGES/DRESSINGS) ×1
DERMABOND ADVANCED .7 DNX12 (GAUZE/BANDAGES/DRESSINGS) ×1 IMPLANT
DIFFUSER DRILL AIR PNEUMATIC (MISCELLANEOUS) ×2 IMPLANT
DRAPE LAPAROTOMY 100X72X124 (DRAPES) ×2 IMPLANT
DRAPE MICROSCOPE LEICA (MISCELLANEOUS) ×2 IMPLANT
DRAPE SURG 17X23 STRL (DRAPES) ×2 IMPLANT
DURAPREP 26ML APPLICATOR (WOUND CARE) ×2 IMPLANT
ELECT REM PT RETURN 9FT ADLT (ELECTROSURGICAL) ×2
ELECTRODE REM PT RTRN 9FT ADLT (ELECTROSURGICAL) ×1 IMPLANT
GAUZE 4X4 16PLY ~~LOC~~+RFID DBL (SPONGE) IMPLANT
GAUZE SPONGE 4X4 12PLY STRL (GAUZE/BANDAGES/DRESSINGS) IMPLANT
GLOVE BIOGEL PI IND STRL 7.5 (GLOVE) IMPLANT
GLOVE BIOGEL PI INDICATOR 7.5 (GLOVE) ×3
GLOVE ECLIPSE 6.5 STRL STRAW (GLOVE) ×2 IMPLANT
GLOVE ECLIPSE 7.0 STRL STRAW (GLOVE) ×4 IMPLANT
GLOVE EXAM NITRILE XL STR (GLOVE) IMPLANT
GOWN STRL REUS W/ TWL LRG LVL3 (GOWN DISPOSABLE) ×2 IMPLANT
GOWN STRL REUS W/ TWL XL LVL3 (GOWN DISPOSABLE) IMPLANT
GOWN STRL REUS W/TWL 2XL LVL3 (GOWN DISPOSABLE) IMPLANT
GOWN STRL REUS W/TWL LRG LVL3 (GOWN DISPOSABLE) ×3
GOWN STRL REUS W/TWL XL LVL3 (GOWN DISPOSABLE) ×1
KIT BASIN OR (CUSTOM PROCEDURE TRAY) ×2 IMPLANT
KIT TURNOVER KIT B (KITS) ×2 IMPLANT
NDL HYPO 25X1 1.5 SAFETY (NEEDLE) ×1 IMPLANT
NDL SPNL 18GX3.5 QUINCKE PK (NEEDLE) IMPLANT
NEEDLE HYPO 25X1 1.5 SAFETY (NEEDLE) ×2 IMPLANT
NEEDLE SPNL 18GX3.5 QUINCKE PK (NEEDLE) ×2 IMPLANT
NS IRRIG 1000ML POUR BTL (IV SOLUTION) ×2 IMPLANT
OIL CARTRIDGE MAESTRO DRILL (MISCELLANEOUS) ×2
PACK LAMINECTOMY NEURO (CUSTOM PROCEDURE TRAY) ×2 IMPLANT
PAD ARMBOARD 7.5X6 YLW CONV (MISCELLANEOUS) ×5 IMPLANT
SPIKE FLUID TRANSFER (MISCELLANEOUS) ×1 IMPLANT
SPONGE SURGIFOAM ABS GEL SZ50 (HEMOSTASIS) ×2 IMPLANT
SPONGE T-LAP 4X18 ~~LOC~~+RFID (SPONGE) IMPLANT
STRIP CLOSURE SKIN 1/2X4 (GAUZE/BANDAGES/DRESSINGS) IMPLANT
SUT VIC AB 0 CT1 18XCR BRD8 (SUTURE) ×1 IMPLANT
SUT VIC AB 0 CT1 8-18 (SUTURE) ×1
SUT VIC AB 2-0 CT1 18 (SUTURE) ×2 IMPLANT
SUT VIC AB 3-0 SH 8-18 (SUTURE) ×2 IMPLANT
TOWEL GREEN STERILE (TOWEL DISPOSABLE) ×2 IMPLANT
TOWEL GREEN STERILE FF (TOWEL DISPOSABLE) ×2 IMPLANT
WATER STERILE IRR 1000ML POUR (IV SOLUTION) ×2 IMPLANT

## 2022-04-11 ENCOUNTER — Encounter (HOSPITAL_COMMUNITY): Payer: Self-pay | Admitting: Neurosurgery

## 2022-04-11 DIAGNOSIS — M5126 Other intervertebral disc displacement, lumbar region: Secondary | ICD-10-CM | POA: Diagnosis not present

## 2022-04-11 MED ORDER — HYDROCODONE-ACETAMINOPHEN 7.5-325 MG PO TABS
1.0000 | ORAL_TABLET | ORAL | 0 refills | Status: DC | PRN
Start: 2022-04-11 — End: 2023-01-29

## 2022-04-11 MED ORDER — DIAZEPAM 5 MG PO TABS: 5.0000 mg | ORAL_TABLET | Freq: Four times a day (QID) | ORAL | 0 refills | Status: DC | PRN

## 2022-04-11 NOTE — Discharge Summary (Signed)
Discharge Summary  Date of Admission: 04/10/2022  Date of Discharge: 04/11/22  Attending Physician: Coletta Memos, MD  Hospital Course: Patient was admitted following an uncomplicated L3-4 discectomy. They were recovered in PACU and transferred to [facility]. Their preop symptoms were completely resolved, their hospital course was uncomplicated and the patient was discharged home on 04/11/22. They will follow up in clinic with me in clinic in 2 weeks.  Neurologic exam at discharge:  Strength 5/5 x4 and SILTx4   Discharge diagnosis: Lumbar radiculopathy  Jadene Pierini, MD 04/11/22 8:25 AM

## 2022-12-15 ENCOUNTER — Other Ambulatory Visit: Payer: Self-pay

## 2022-12-15 ENCOUNTER — Emergency Department (HOSPITAL_BASED_OUTPATIENT_CLINIC_OR_DEPARTMENT_OTHER): Payer: Medicaid Other | Admitting: Radiology

## 2022-12-15 ENCOUNTER — Emergency Department (HOSPITAL_BASED_OUTPATIENT_CLINIC_OR_DEPARTMENT_OTHER)
Admission: EM | Admit: 2022-12-15 | Discharge: 2022-12-15 | Disposition: A | Discharge status: Home / Self Care | Payer: Medicaid Other | Attending: Emergency Medicine | Admitting: Emergency Medicine

## 2022-12-15 ENCOUNTER — Encounter (HOSPITAL_BASED_OUTPATIENT_CLINIC_OR_DEPARTMENT_OTHER): Payer: Self-pay

## 2022-12-15 ENCOUNTER — Emergency Department (HOSPITAL_BASED_OUTPATIENT_CLINIC_OR_DEPARTMENT_OTHER): Payer: Medicaid Other

## 2022-12-15 DIAGNOSIS — R079 Chest pain, unspecified: Secondary | ICD-10-CM | POA: Insufficient documentation

## 2022-12-15 DIAGNOSIS — E876 Hypokalemia: Secondary | ICD-10-CM | POA: Diagnosis not present

## 2022-12-15 DIAGNOSIS — I209 Angina pectoris, unspecified: Secondary | ICD-10-CM

## 2022-12-15 HISTORY — DX: Angina pectoris, unspecified: I20.9

## 2022-12-15 LAB — BASIC METABOLIC PANEL
Anion gap: 10 (ref 5–15)
BUN: 8 mg/dL (ref 6–20)
CO2: 25 mmol/L (ref 22–32)
Calcium: 10 mg/dL (ref 8.9–10.3)
Chloride: 103 mmol/L (ref 98–111)
Creatinine, Ser: 1.01 mg/dL — ABNORMAL HIGH (ref 0.44–1.00)
GFR, Estimated: >60 mL/min (ref >60)
Glucose, Bld: 107 mg/dL — ABNORMAL HIGH (ref 70–99)
Potassium: 2.9 mmol/L — ABNORMAL LOW (ref 3.5–5.1)
Sodium: 138 mmol/L (ref 135–145)

## 2022-12-15 LAB — CBC
HCT: 41.2 % (ref 36.0–46.0)
Hemoglobin: 14 g/dL (ref 12.0–15.0)
MCH: 31.7 pg (ref 26.0–34.0)
MCHC: 34 g/dL (ref 30.0–36.0)
MCV: 93.4 fL (ref 80.0–100.0)
Platelets: 419 10*3/uL — ABNORMAL HIGH (ref 150–400)
RBC: 4.41 MIL/uL (ref 3.87–5.11)
RDW: 12.2 % (ref 11.5–15.5)
WBC: 12.3 10*3/uL — ABNORMAL HIGH (ref 4.0–10.5)
nRBC: 0 % (ref 0.0–0.2)

## 2022-12-15 LAB — HCG, SERUM, QUALITATIVE: Preg, Serum: NEGATIVE

## 2022-12-15 LAB — TROPONIN I (HIGH SENSITIVITY)
Troponin I (High Sensitivity): <2 ng/L (ref <18)
Troponin I (High Sensitivity): <2 ng/L (ref <18)

## 2022-12-15 MED ORDER — KETOROLAC TROMETHAMINE 15 MG/ML IJ SOLN
15.0000 mg | Freq: Once | INTRAMUSCULAR | Status: AC
  Administered 2022-12-15: 15 mg via INTRAVENOUS
  Filled 2022-12-15: qty 1

## 2022-12-15 MED ORDER — NAPROXEN 500 MG PO TABS: 500.0000 mg | ORAL_TABLET | Freq: Two times a day (BID) | ORAL | 0 refills | Status: DC

## 2022-12-15 MED ORDER — MORPHINE SULFATE (PF) 4 MG/ML IV SOLN
4.0000 mg | Freq: Once | INTRAVENOUS | Status: AC
  Administered 2022-12-15: 4 mg via INTRAVENOUS
  Filled 2022-12-15: qty 1

## 2022-12-15 MED ORDER — IOHEXOL 350 MG/ML SOLN
100.0000 mL | Freq: Once | INTRAVENOUS | Status: AC | PRN
  Administered 2022-12-15: 75 mL via INTRAVENOUS

## 2022-12-15 MED ORDER — POTASSIUM CHLORIDE CRYS ER 20 MEQ PO TBCR
40.0000 meq | EXTENDED_RELEASE_TABLET | Freq: Once | ORAL | Status: AC
  Administered 2022-12-15: 40 meq via ORAL
  Filled 2022-12-15: qty 2

## 2022-12-15 NOTE — ED Triage Notes (Signed)
Patient here POV from Home.  Endorses CP for past few days. Very tender to Touch. Mostly Center and spread to left Chest today as well. SOB as well. Pain is Constant and worse with certain movements.   NAD Noted during Triage. A&Ox4. GCS 15. Ambulatory.

## 2022-12-15 NOTE — Discharge Instructions (Addendum)
You were evaluated today for chest pain.  Your workup was reassuring for no signs of acute coronary syndrome or pulmonary embolism.  There were also no signs of pneumonia.  Recommend ice and heat over the affected area.  I also recommend anti-inflammatory medications.  I have prescribed Naprosyn which may be taken twice daily.  He may also take Tylenol as needed.  If you develop any life-threatening symptoms such as sudden shortness of breath or worsening chest pain, please return to the emergency department.

## 2022-12-15 NOTE — ED Notes (Signed)
Patient resting quietly in stretcher, respirations even, unlabored, no acute distress noted. Denies needs at this time.

## 2022-12-15 NOTE — ED Notes (Signed)
Pt at CT

## 2022-12-15 NOTE — ED Provider Notes (Signed)
[facility] Provider Note   CSN: [id number] Arrival date & time: 12/15/22  1757     History  Chief Complaint  Patient presents with   Chest Pain    IDABEL STADER is a 32 y.o. female.  Patient presents emerged department complaining of right-sided chest pain which began 2 days ago.  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  She does endorse occasionally lifting and moving heavy objects.  She endorses shortness of breath which began today.  She describes the pain as sharp and able to be reproduced with palpation.  She denies recent surgery, periods of inactivity, travel, cancer history, smoking history.  Patient denies abdominal pain, nausea, vomiting.  There are no alleviating factors.  Past medical history significant for bipolar disorder, anxiety, depression, unspecified chest pain, rapid heartbeat, panic attacks  HPI     Home Medications Prior to Admission medications   Medication Sig Start Date End Date Taking? Authorizing Provider  naproxen (NAPROSYN) 500 MG tablet Take 1 tablet (500 mg total) by mouth 2 (two) times daily. 12/15/22  Yes Dorothyann Peng, PA-C  busPIRone (BUSPAR) 5 MG tablet Take 5 mg by mouth in the morning and at bedtime. 01/22/22   [provider]  diazepam (VALIUM) 5 MG tablet Take 1 tablet (5 mg total) by mouth every 6 (six) hours as needed for muscle spasms. 04/11/22   Judith Part, MD  HYDROcodone-acetaminophen (NORCO) 7.5-325 MG tablet Take 1 tablet by mouth every 4 (four) hours as needed (pain). 04/11/22   Judith Part, MD  QUEtiapine (SEROQUEL XR) 400 MG 24 hr tablet Take 1 tablet (400 mg total) by mouth at bedtime. 10/13/16 04/10/22  Fatima Blank, MD      Allergies    Aluminum and Nickel    Review of Systems   Review of Systems  Respiratory:  Positive for shortness of breath.   Cardiovascular:  Positive for chest pain.    Physical  Exam Updated Vital Signs BP 114/89   Pulse 61   Temp 98 F (36.7 C) (Oral)   Resp 14   Ht '5\' 3"'$  (1.6 m)   Wt 72.6 kg   SpO2 100%   BMI 28.34 kg/m  Physical Exam Vitals and nursing note reviewed.  Constitutional:      General: She is not in acute distress.    Appearance: She is well-developed.  HENT:     Head: Normocephalic and atraumatic.  Eyes:     Conjunctiva/sclera: Conjunctivae normal.  Cardiovascular:     Rate and Rhythm: Normal rate and regular rhythm.     Heart sounds: No murmur heard. Pulmonary:     Effort: Pulmonary effort is normal. No respiratory distress.     Breath sounds: Normal breath sounds.  Chest:     Chest wall: Tenderness present.  Abdominal:     Palpations: Abdomen is soft.     Tenderness: There is no abdominal tenderness.  Musculoskeletal:        General: No swelling.     Cervical back: Neck supple.  Skin:    General: Skin is warm and dry.     Capillary Refill: Capillary refill takes less than 2 seconds.  Neurological:     Mental Status: She is alert.  Psychiatric:        Mood and Affect: Mood normal.     ED Results / Procedures / Treatments   Labs (all labs ordered are  listed, but only abnormal results are displayed) Labs Reviewed  BASIC METABOLIC PANEL - Abnormal; Notable for the following components:      Result Value   Potassium 2.9 (*)    Glucose, Bld 107 (*)    Creatinine, Ser 1.01 (*)    All other components within normal limits  CBC - Abnormal; Notable for the following components:   WBC 12.3 (*)    Platelets 419 (*)    All other components within normal limits  HCG, SERUM, QUALITATIVE  TROPONIN I (HIGH SENSITIVITY)  TROPONIN I (HIGH SENSITIVITY)    EKG EKG Interpretation  Date/Time:  Tuesday December 15 2022 18:03:40 EST Ventricular Rate:  106 PR Interval:  118 QRS Duration: 76 QT Interval:  354 QTC Calculation: 470 R Axis:   59 Text Interpretation: Sinus tachycardia T wave abnormality, consider inferior ischemia  Abnormal ECG When compared with ECG of 15-Feb-2018 13:44, Similar to previous Confirmed by Lavenia Atlas [phone]) on 12/15/2022 6:09:38 PM  Radiology CT Angio Chest PE W and/or Wo Contrast  Result Date: 12/15/2022 CLINICAL DATA:  Pulmonary embolism (PE) suspected, high prob. Chest pain EXAM: CT ANGIOGRAPHY CHEST WITH CONTRAST TECHNIQUE: Multidetector CT imaging of the chest was performed using the standard protocol during bolus administration of intravenous contrast. Multiplanar CT image reconstructions and MIPs were obtained to evaluate the vascular anatomy. RADIATION DOSE REDUCTION: This exam was performed according to the departmental dose-optimization program which includes automated exposure control, adjustment of the mA and/or kV according to patient size and/or use of iterative reconstruction technique. CONTRAST:  43m OMNIPAQUE IOHEXOL 350 MG/ML SOLN COMPARISON:  None Available. FINDINGS: Cardiovascular: Satisfactory opacification of the pulmonary arteries to the segmental level. No evidence of pulmonary embolism. Normal heart size. No pericardial effusion. Mediastinum/Nodes: No enlarged mediastinal, hilar, or axillary lymph nodes. Thyroid gland, trachea, and esophagus demonstrate no significant findings. Lungs/Pleura: Lungs are clear. No pleural effusion or pneumothorax. Upper Abdomen: No acute abnormality. Musculoskeletal: No acute bone abnormality. No lytic or blastic bone lesion. Degenerative changes are seen within the thoracic spine. Review of the MIP images confirms the above findings. IMPRESSION: 1. No pulmonary embolism. No acute intrathoracic pathology identified. Electronically Signed   By: AFidela SalisburyM.D.   On: 12/15/2022 22:25   DG Chest 2 View  Result Date: 12/15/2022 CLINICAL DATA:  Chest pain EXAM: CHEST - 2 VIEW COMPARISON:  None Available. FINDINGS: The heart size and mediastinal contours are within normal limits. Both lungs are clear. The visualized skeletal structures are  unremarkable. IMPRESSION: No active cardiopulmonary disease. Electronically Signed   By: AFidela SalisburyM.D.   On: 12/15/2022 18:34    Procedures Procedures    Medications Ordered in ED Medications  potassium chloride SA (KLOR-CON M) CR tablet 40 mEq (40 mEq Oral Given 12/15/22 2224)  ketorolac (TORADOL) 15 MG/ML injection 15 mg (15 mg Intravenous Given 12/15/22 2225)  morphine (PF) 4 MG/ML injection 4 mg (4 mg Intravenous Given 12/15/22 2225)  iohexol (OMNIPAQUE) 350 MG/ML injection 100 mL (75 mLs Intravenous Contrast Given 12/15/22 2213)    ED Course/ Medical Decision Making/ A&P                             Medical Decision Making Amount and/or Complexity of Data Reviewed Labs: ordered. Radiology: ordered.  Risk Prescription drug management.   This patient presents to the ED for concern of chest pain, this involves an extensive number of treatment options, and is a  complaint that carries with it a high risk of complications and morbidity.  The differential diagnosis includes PE, ACS, dissection, pneumonia, musculoskeletal pain, others   Co morbidities that complicate the patient evaluation  Anxiety   Additional history obtained:  Additional history obtained from visitor at bedside   Lab Tests:  I Ordered, and personally interpreted labs.  The pertinent results include: Troponin less than 2, negative pregnancy test, WBC 12.3, potassium 2.9, creatinine 1.01   Imaging Studies ordered:  I ordered imaging studies including chest x-ray CT angio chest PE study I independently visualized and interpreted imaging which showed no acute findings I agree with the radiologist interpretation   Cardiac Monitoring: / EKG:  The patient was maintained on a cardiac monitor.  I personally viewed and interpreted the cardiac monitored which showed an underlying rhythm of: Sinus tachycardia   Problem List / ED Course / Critical interventions / Medication management   I ordered  medication including Toradol, morphine for pain, potassium for hypokalemia Reevaluation of the patient after these medicines showed that the patient improved I have reviewed the patients home medicines and have made adjustments as needed   Social Determinants of Health:  Patient has medicaid as her primary insurance   Test / Admission - Considered:  Patient's pain improved somewhat with medications.  She continues to have pain but at this time I have very low clinical suspicion of PE with normal CT PE study, low clinical suspicion of ACS with negative troponin and nonischemic EKG.  The pain is reproducible with palpation.  This does seem to be some sort of musculoskeletal tenderness.  I discussed anti-inflammatories with the patient and recommended that she take these for symptom relief.  There was also no pneumonia on chest x-ray and lungs are clear to auscultation.  Discussed Voltaren gel use along with ice, heat, and rest.  Return precautions provided        Final Clinical Impression(s) / ED Diagnoses Final diagnoses:  Chest pain, unspecified type    Rx / DC Orders ED Discharge Orders          Ordered    naproxen (NAPROSYN) 500 MG tablet  2 times daily        12/15/22 2334              Ronny Bacon 12/15/22 2335    Horton, Alvin Critchley, DO 12/16/22 0003

## 2022-12-15 NOTE — ED Notes (Signed)
Reviewed AVS with patient, patient expressed understanding of directions, denies further questions at this time.

## 2023-01-20 ENCOUNTER — Other Ambulatory Visit: Payer: Self-pay | Admitting: Neurosurgery

## 2023-01-28 ENCOUNTER — Encounter (HOSPITAL_COMMUNITY): Payer: Self-pay

## 2023-01-28 NOTE — Pre-Procedure Instructions (Addendum)
Surgical Instructions    Your procedure is scheduled on Thursday, February 04, 2023.   Report to [facility] Main Entrance "A" at 11 A.M., then check in with the Admitting office.  Call this number if you have problems the morning of surgery:  [phone]  If you have any questions prior to your surgery date call [phone]: Open Monday-Friday 8am-4pm If you experience any cold or flu symptoms such as cough, fever, chills, shortness of breath, etc. between now and your scheduled surgery, please notify us at the above number.     Remember:  Do not eat after midnight the night before your surgery  You may drink clear liquids until 10 AM the morning of your surgery.   Clear liquids allowed are: Water, Non-Citrus Juices (without pulp), Carbonated Beverages, Clear Tea, Black Coffee Only (NO MILK, CREAM OR POWDERED CREAMER of any kind), and Gatorade.    Take these medicines the morning of surgery with A SIP OF WATER :             busPIRone (BUSPAR)              HYDROcodone-acetaminophen (NORCO)  as needed             diazepam (VALIUM) as needed  As of today, STOP taking any Aspirin (unless otherwise instructed by your surgeon) Aleve, Naproxen, Ibuprofen, Motrin, Advil, Goody's, BC's, all herbal medications, fish oil, and all vitamins.                     Do NOT Smoke (Tobacco/Vaping) for 24 hours prior to your procedure.  If you use a CPAP at night, you may bring your mask/headgear for your overnight stay.   Contacts, glasses, piercing's, hearing aid's, dentures or partials may not be worn into surgery, please bring cases for these belongings.    For patients admitted to the hospital, discharge time will be determined by your treatment team.   Patients discharged the day of surgery will not be allowed to drive home, and someone needs to stay with them for 24 hours.  SURGICAL WAITING ROOM VISITATION Patients having surgery or a procedure may have no more than 2 support people in the  waiting area - these visitors may rotate.   Children under the age of 91 must have an adult with them who is not the patient. If the patient needs to stay at the hospital during part of their recovery, the visitor guidelines for inpatient rooms apply. Pre-op nurse will coordinate an appropriate time for 1 support person to accompany patient in pre-op.  This support person may not rotate.   Please refer to the [facility] website for the visitor guidelines for Inpatients (after your surgery is over and you are in a regular room).       Please read over the following fact sheets that you were given.    If you received a COVID test during your pre-op visit  it is requested that you wear a mask when out in public, stay away from anyone that may not be feeling well and notify your surgeon if you develop symptoms. If you have been in contact with anyone that has tested positive in the last 10 days please notify you surgeon.

## 2023-01-29 ENCOUNTER — Other Ambulatory Visit: Payer: Self-pay

## 2023-01-29 ENCOUNTER — Encounter (HOSPITAL_COMMUNITY)
Admission: RE | Admit: 2023-01-29 | Discharge: 2023-01-29 | Disposition: A | Discharge status: Home / Self Care | Payer: Medicaid Other | Source: Ambulatory Visit | Attending: Neurosurgery | Admitting: Neurosurgery

## 2023-01-29 ENCOUNTER — Encounter (HOSPITAL_COMMUNITY): Payer: Self-pay

## 2023-01-29 VITALS — BP 112/77 | HR 78 | Temp 98.0°F | Resp 18 | Ht 63.0 in | Wt 167.8 lb

## 2023-01-29 DIAGNOSIS — Z01818 Encounter for other preprocedural examination: Secondary | ICD-10-CM

## 2023-01-29 DIAGNOSIS — F410 Panic disorder [episodic paroxysmal anxiety] without agoraphobia: Secondary | ICD-10-CM

## 2023-01-29 DIAGNOSIS — Z01812 Encounter for preprocedural laboratory examination: Secondary | ICD-10-CM | POA: Insufficient documentation

## 2023-01-29 HISTORY — DX: Migraine, unspecified, not intractable, without status migrainosus: G43.909

## 2023-01-29 LAB — CBC
HCT: 38.9 % (ref 36.0–46.0)
Hemoglobin: 13 g/dL (ref 12.0–15.0)
MCH: 32 pg (ref 26.0–34.0)
MCHC: 33.4 g/dL (ref 30.0–36.0)
MCV: 95.8 fL (ref 80.0–100.0)
Platelets: 306 10*3/uL (ref 150–400)
RBC: 4.06 MIL/uL (ref 3.87–5.11)
RDW: 11.8 % (ref 11.5–15.5)
WBC: 8.4 10*3/uL (ref 4.0–10.5)
nRBC: 0 % (ref 0.0–0.2)

## 2023-01-29 LAB — BASIC METABOLIC PANEL
Anion gap: 7 (ref 5–15)
BUN: 6 mg/dL (ref 6–20)
CO2: 28 mmol/L (ref 22–32)
Calcium: 9 mg/dL (ref 8.9–10.3)
Chloride: 104 mmol/L (ref 98–111)
Creatinine, Ser: 0.85 mg/dL (ref 0.44–1.00)
GFR, Estimated: >60 mL/min (ref >60)
Glucose, Bld: 76 mg/dL (ref 70–99)
Potassium: 3.6 mmol/L (ref 3.5–5.1)
Sodium: 139 mmol/L (ref 135–145)

## 2023-01-29 LAB — SURGICAL PCR SCREEN
MRSA, PCR: NEGATIVE
Staphylococcus aureus: NEGATIVE

## 2023-01-29 NOTE — Progress Notes (Signed)
PCP - denies Cardiologist - denies  PPM/ICD - denies   Chest x-ray - 12/15/22 EKG - 12/15/22 Stress Test - denies ECHO - denies Cardiac Cath - denies  Sleep Study - denies   DM- denies  ASA/Blood Thinner Instructions: n/a   ERAS Protcol - yes, no drink   COVID TEST- n/a   Anesthesia review: no  Patient denies shortness of breath, fever, cough and chest pain at PAT appointment   All instructions explained to the patient, with a verbal understanding of the material. Patient agrees to go over the instructions while at home for a better understanding.  The opportunity to ask questions was provided.

## 2023-02-04 ENCOUNTER — Ambulatory Visit (HOSPITAL_BASED_OUTPATIENT_CLINIC_OR_DEPARTMENT_OTHER): Payer: Medicaid Other

## 2023-02-04 ENCOUNTER — Ambulatory Visit (HOSPITAL_COMMUNITY): Payer: Medicaid Other

## 2023-02-04 ENCOUNTER — Ambulatory Visit (HOSPITAL_COMMUNITY)
Admission: RE | Disposition: A | Discharge status: Home / Self Care | Payer: Self-pay | Source: Home / Self Care | Attending: Neurosurgery

## 2023-02-04 ENCOUNTER — Encounter (HOSPITAL_COMMUNITY): Payer: Self-pay | Admitting: Neurosurgery

## 2023-02-04 ENCOUNTER — Other Ambulatory Visit: Payer: Self-pay

## 2023-02-04 ENCOUNTER — Observation Stay (HOSPITAL_COMMUNITY)
Admission: RE | Admit: 2023-02-04 | Discharge: 2023-02-05 | Disposition: A | Discharge status: Home / Self Care | Payer: Medicaid Other | Attending: Neurosurgery | Admitting: Neurosurgery

## 2023-02-04 DIAGNOSIS — F17290 Nicotine dependence, other tobacco product, uncomplicated: Secondary | ICD-10-CM | POA: Insufficient documentation

## 2023-02-04 DIAGNOSIS — M5127 Other intervertebral disc displacement, lumbosacral region: Principal | ICD-10-CM | POA: Insufficient documentation

## 2023-02-04 DIAGNOSIS — M5126 Other intervertebral disc displacement, lumbar region: Secondary | ICD-10-CM | POA: Diagnosis not present

## 2023-02-04 DIAGNOSIS — Z01818 Encounter for other preprocedural examination: Secondary | ICD-10-CM

## 2023-02-04 DIAGNOSIS — Z79899 Other long term (current) drug therapy: Secondary | ICD-10-CM | POA: Diagnosis not present

## 2023-02-04 HISTORY — PX: LUMBAR LAMINECTOMY/DECOMPRESSION MICRODISCECTOMY: SHX5026

## 2023-02-04 LAB — POCT PREGNANCY, URINE: Preg Test, Ur: NEGATIVE

## 2023-02-04 SURGERY — LUMBAR LAMINECTOMY/DECOMPRESSION MICRODISCECTOMY 1 LEVEL
Anesthesia: General | Site: Spine Lumbar | Laterality: Right

## 2023-02-04 MED ORDER — MIDAZOLAM HCL 2 MG/2ML IJ SOLN
INTRAMUSCULAR | Status: DC | PRN
  Administered 2023-02-04: 2 mg via INTRAVENOUS

## 2023-02-04 MED ORDER — ONDANSETRON HCL 4 MG/2ML IJ SOLN
4.0000 mg | Freq: Four times a day (QID) | INTRAMUSCULAR | Status: DC | PRN
  Administered 2023-02-04: 4 mg via INTRAVENOUS
  Filled 2023-02-04: qty 2

## 2023-02-04 MED ORDER — LIDOCAINE 2% (20 MG/ML) 5 ML SYRINGE
INTRAMUSCULAR | Status: DC | PRN
  Administered 2023-02-04: 60 mg via INTRAVENOUS

## 2023-02-04 MED ORDER — LIDOCAINE 2% (20 MG/ML) 5 ML SYRINGE
INTRAMUSCULAR | Status: AC
  Filled 2023-02-04: qty 5

## 2023-02-04 MED ORDER — CHLORHEXIDINE GLUCONATE 0.12 % MT SOLN
OROMUCOSAL | Status: AC
  Filled 2023-02-04: qty 15

## 2023-02-04 MED ORDER — SCOPOLAMINE 1 MG/3DAYS TD PT72
MEDICATED_PATCH | TRANSDERMAL | Status: DC | PRN
  Administered 2023-02-04: 1 via TRANSDERMAL

## 2023-02-04 MED ORDER — PHENOL 1.4 % MT LIQD: 1.0000 | OROMUCOSAL | Status: DC | PRN

## 2023-02-04 MED ORDER — ONDANSETRON HCL 4 MG/2ML IJ SOLN
INTRAMUSCULAR | Status: DC | PRN
  Administered 2023-02-04: 4 mg via INTRAVENOUS

## 2023-02-04 MED ORDER — OXYCODONE HCL 5 MG PO TABS
ORAL_TABLET | ORAL | Status: AC
  Filled 2023-02-04: qty 1

## 2023-02-04 MED ORDER — OXYCODONE HCL 5 MG PO TABS
10.0000 mg | ORAL_TABLET | ORAL | Status: DC | PRN
  Administered 2023-02-04 – 2023-02-05 (×3): 10 mg via ORAL
  Filled 2023-02-04 (×3): qty 2

## 2023-02-04 MED ORDER — FENTANYL CITRATE (PF) 100 MCG/2ML IJ SOLN
INTRAMUSCULAR | Status: DC | PRN
  Administered 2023-02-04: 100 ug via INTRAVENOUS

## 2023-02-04 MED ORDER — DEXAMETHASONE SODIUM PHOSPHATE 10 MG/ML IJ SOLN
INTRAMUSCULAR | Status: DC | PRN
  Administered 2023-02-04: 10 mg via INTRAVENOUS

## 2023-02-04 MED ORDER — ACETAMINOPHEN 650 MG RE SUPP: 650.0000 mg | RECTAL | Status: DC | PRN

## 2023-02-04 MED ORDER — METHYLPREDNISOLONE ACETATE 80 MG/ML IJ SUSP
INTRAMUSCULAR | Status: AC
  Filled 2023-02-04: qty 1

## 2023-02-04 MED ORDER — QUETIAPINE FUMARATE ER 200 MG PO TB24
400.0000 mg | ORAL_TABLET | Freq: Every day | ORAL | Status: DC
  Administered 2023-02-04: 400 mg via ORAL
  Filled 2023-02-04: qty 2

## 2023-02-04 MED ORDER — LIDOCAINE-EPINEPHRINE 0.5 %-1:200000 IJ SOLN
INTRAMUSCULAR | Status: AC
  Filled 2023-02-04: qty 50

## 2023-02-04 MED ORDER — KETOROLAC TROMETHAMINE 30 MG/ML IJ SOLN
INTRAMUSCULAR | Status: AC
  Filled 2023-02-04: qty 1

## 2023-02-04 MED ORDER — CHLORHEXIDINE GLUCONATE CLOTH 2 % EX PADS: 6.0000 | MEDICATED_PAD | Freq: Once | CUTANEOUS | Status: DC

## 2023-02-04 MED ORDER — DIAZEPAM 5 MG PO TABS
5.0000 mg | ORAL_TABLET | Freq: Four times a day (QID) | ORAL | Status: DC | PRN
  Administered 2023-02-04 – 2023-02-05 (×2): 5 mg via ORAL
  Filled 2023-02-04 (×2): qty 1

## 2023-02-04 MED ORDER — HYDROMORPHONE HCL 1 MG/ML IJ SOLN
INTRAMUSCULAR | Status: AC
  Filled 2023-02-04: qty 1

## 2023-02-04 MED ORDER — SUGAMMADEX SODIUM 200 MG/2ML IV SOLN
INTRAVENOUS | Status: DC | PRN
  Administered 2023-02-04: 149.6 mg via INTRAVENOUS

## 2023-02-04 MED ORDER — HYDROMORPHONE HCL 1 MG/ML IJ SOLN
0.2500 mg | INTRAMUSCULAR | Status: DC | PRN
  Administered 2023-02-04 (×4): 0.5 mg via INTRAVENOUS

## 2023-02-04 MED ORDER — KETOROLAC TROMETHAMINE 30 MG/ML IJ SOLN
INTRAMUSCULAR | Status: DC | PRN
  Administered 2023-02-04: 30 mg via INTRAVENOUS

## 2023-02-04 MED ORDER — SODIUM CHLORIDE 0.9% FLUSH
3.0000 mL | Freq: Two times a day (BID) | INTRAVENOUS | Status: DC
  Administered 2023-02-04: 3 mL via INTRAVENOUS

## 2023-02-04 MED ORDER — POTASSIUM CHLORIDE IN NACL 20-0.9 MEQ/L-% IV SOLN: INTRAVENOUS | Status: DC

## 2023-02-04 MED ORDER — AMISULPRIDE (ANTIEMETIC) 5 MG/2ML IV SOLN
10.0000 mg | Freq: Once | INTRAVENOUS | Status: AC | PRN
  Administered 2023-02-04: 10 mg via INTRAVENOUS

## 2023-02-04 MED ORDER — LIDOCAINE-EPINEPHRINE 0.5 %-1:200000 IJ SOLN
INTRAMUSCULAR | Status: DC | PRN
  Administered 2023-02-04: 10 mL
  Administered 2023-02-04: 8 mL

## 2023-02-04 MED ORDER — LACTATED RINGERS IV SOLN: INTRAVENOUS | Status: DC

## 2023-02-04 MED ORDER — KETOROLAC TROMETHAMINE 15 MG/ML IJ SOLN
15.0000 mg | Freq: Four times a day (QID) | INTRAMUSCULAR | Status: DC
  Administered 2023-02-04 – 2023-02-05 (×2): 15 mg via INTRAVENOUS
  Filled 2023-02-04 (×2): qty 1

## 2023-02-04 MED ORDER — ONDANSETRON HCL 4 MG/2ML IJ SOLN
INTRAMUSCULAR | Status: AC
  Filled 2023-02-04: qty 2

## 2023-02-04 MED ORDER — THROMBIN 5000 UNITS EX SOLR
CUTANEOUS | Status: AC
  Filled 2023-02-04: qty 5000

## 2023-02-04 MED ORDER — METHYLPREDNISOLONE ACETATE 80 MG/ML IJ SUSP
INTRAMUSCULAR | Status: DC | PRN
  Administered 2023-02-04: 80 mg

## 2023-02-04 MED ORDER — CEFAZOLIN SODIUM-DEXTROSE 2-4 GM/100ML-% IV SOLN
2.0000 g | INTRAVENOUS | Status: AC
  Administered 2023-02-04: 2 g via INTRAVENOUS
  Filled 2023-02-04: qty 100

## 2023-02-04 MED ORDER — ROCURONIUM BROMIDE 10 MG/ML (PF) SYRINGE
PREFILLED_SYRINGE | INTRAVENOUS | Status: AC
  Filled 2023-02-04: qty 10

## 2023-02-04 MED ORDER — PROPOFOL 10 MG/ML IV BOLUS
INTRAVENOUS | Status: DC | PRN
  Administered 2023-02-04: 25 ug/kg/min via INTRAVENOUS
  Administered 2023-02-04: 200 mg via INTRAVENOUS

## 2023-02-04 MED ORDER — ACETAMINOPHEN 10 MG/ML IV SOLN: 1000.0000 mg | Freq: Once | INTRAVENOUS | Status: DC | PRN

## 2023-02-04 MED ORDER — MENTHOL 3 MG MT LOZG: 1.0000 | LOZENGE | OROMUCOSAL | Status: DC | PRN

## 2023-02-04 MED ORDER — ROCURONIUM BROMIDE 10 MG/ML (PF) SYRINGE
PREFILLED_SYRINGE | INTRAVENOUS | Status: DC | PRN
  Administered 2023-02-04: 40 mg via INTRAVENOUS

## 2023-02-04 MED ORDER — PROMETHAZINE HCL 25 MG/ML IJ SOLN: 6.2500 mg | INTRAMUSCULAR | Status: DC | PRN

## 2023-02-04 MED ORDER — ACETAMINOPHEN 325 MG PO TABS
650.0000 mg | ORAL_TABLET | ORAL | Status: DC | PRN
  Administered 2023-02-04 – 2023-02-05 (×2): 650 mg via ORAL
  Filled 2023-02-04 (×2): qty 2

## 2023-02-04 MED ORDER — ONDANSETRON HCL 4 MG PO TABS: 4.0000 mg | ORAL_TABLET | Freq: Four times a day (QID) | ORAL | Status: DC | PRN

## 2023-02-04 MED ORDER — FENTANYL CITRATE (PF) 100 MCG/2ML IJ SOLN
INTRAMUSCULAR | Status: AC
  Filled 2023-02-04: qty 2

## 2023-02-04 MED ORDER — ACETAMINOPHEN 10 MG/ML IV SOLN
INTRAVENOUS | Status: DC | PRN
  Administered 2023-02-04: 1000 mg via INTRAVENOUS

## 2023-02-04 MED ORDER — KETAMINE HCL 10 MG/ML IJ SOLN
INTRAMUSCULAR | Status: DC | PRN
  Administered 2023-02-04: 30 mg via INTRAVENOUS

## 2023-02-04 MED ORDER — OXYCODONE HCL 5 MG/5ML PO SOLN: 5.0000 mg | Freq: Once | ORAL | Status: AC | PRN

## 2023-02-04 MED ORDER — DEXAMETHASONE SODIUM PHOSPHATE 10 MG/ML IJ SOLN
INTRAMUSCULAR | Status: AC
  Filled 2023-02-04: qty 1

## 2023-02-04 MED ORDER — AMISULPRIDE (ANTIEMETIC) 5 MG/2ML IV SOLN
INTRAVENOUS | Status: AC
  Filled 2023-02-04: qty 4

## 2023-02-04 MED ORDER — PROPOFOL 10 MG/ML IV BOLUS
INTRAVENOUS | Status: AC
  Filled 2023-02-04: qty 20

## 2023-02-04 MED ORDER — OXYCODONE HCL 5 MG PO TABS: 5.0000 mg | ORAL_TABLET | ORAL | Status: DC | PRN

## 2023-02-04 MED ORDER — ORAL CARE MOUTH RINSE: 15.0000 mL | Freq: Once | OROMUCOSAL | Status: AC

## 2023-02-04 MED ORDER — FENTANYL CITRATE (PF) 250 MCG/5ML IJ SOLN
INTRAMUSCULAR | Status: AC
  Filled 2023-02-04: qty 5

## 2023-02-04 MED ORDER — CHLORHEXIDINE GLUCONATE 0.12 % MT SOLN
15.0000 mL | Freq: Once | OROMUCOSAL | Status: AC
  Administered 2023-02-04: 15 mL via OROMUCOSAL

## 2023-02-04 MED ORDER — KETOROLAC TROMETHAMINE 30 MG/ML IJ SOLN: 30.0000 mg | Freq: Once | INTRAMUSCULAR | Status: DC | PRN

## 2023-02-04 MED ORDER — SODIUM CHLORIDE 0.9 % IV SOLN
250.0000 mL | INTRAVENOUS | Status: DC
  Administered 2023-02-04: 250 mL via INTRAVENOUS

## 2023-02-04 MED ORDER — 0.9 % SODIUM CHLORIDE (POUR BTL) OPTIME
TOPICAL | Status: DC | PRN
  Administered 2023-02-04: 1000 mL

## 2023-02-04 MED ORDER — KETAMINE HCL 50 MG/5ML IJ SOSY
PREFILLED_SYRINGE | INTRAMUSCULAR | Status: AC
  Filled 2023-02-04: qty 5

## 2023-02-04 MED ORDER — SODIUM CHLORIDE 0.9% FLUSH: 3.0000 mL | INTRAVENOUS | Status: DC | PRN

## 2023-02-04 MED ORDER — FENTANYL CITRATE (PF) 250 MCG/5ML IJ SOLN
INTRAMUSCULAR | Status: DC | PRN
  Administered 2023-02-04: 150 ug via INTRAVENOUS

## 2023-02-04 MED ORDER — OXYCODONE HCL 5 MG PO TABS
5.0000 mg | ORAL_TABLET | Freq: Once | ORAL | Status: AC | PRN
  Administered 2023-02-04: 5 mg via ORAL

## 2023-02-04 MED ORDER — ZOLPIDEM TARTRATE 5 MG PO TABS: 5.0000 mg | ORAL_TABLET | Freq: Every evening | ORAL | Status: DC | PRN

## 2023-02-04 MED ORDER — MIDAZOLAM HCL 2 MG/2ML IJ SOLN
INTRAMUSCULAR | Status: AC
  Filled 2023-02-04: qty 2

## 2023-02-04 MED ORDER — THROMBIN (RECOMBINANT) 5000 UNITS EX SOLR
CUTANEOUS | Status: DC | PRN
  Administered 2023-02-04: 1 via TOPICAL

## 2023-02-04 SURGICAL SUPPLY — 49 items
ADH SKN CLS APL DERMABOND .7 (GAUZE/BANDAGES/DRESSINGS) ×1
APL SKNCLS STERI-STRIP NONHPOA (GAUZE/BANDAGES/DRESSINGS)
BAG COUNTER SPONGE SURGICOUNT (BAG) ×1 IMPLANT
BAG SPNG CNTER NS LX DISP (BAG) ×1
BAND INSRT 18 STRL LF DISP RB (MISCELLANEOUS) ×2
BAND RUBBER #18 3X1/16 STRL (MISCELLANEOUS) ×2 IMPLANT
BENZOIN TINCTURE PRP APPL 2/3 (GAUZE/BANDAGES/DRESSINGS) IMPLANT
BLADE CLIPPER SURG (BLADE) IMPLANT
BUR MATCHSTICK NEURO 3.0 LAGG (BURR) ×1 IMPLANT
BUR PRECISION FLUTE 5.0 (BURR) IMPLANT
CANISTER SUCT 3000ML PPV (MISCELLANEOUS) ×1 IMPLANT
DERMABOND ADVANCED .7 DNX12 (GAUZE/BANDAGES/DRESSINGS) ×1 IMPLANT
DRAPE LAPAROTOMY 100X72X124 (DRAPES) ×1 IMPLANT
DRAPE MICROSCOPE SLANT 54X150 (MISCELLANEOUS) ×1 IMPLANT
DRAPE SURG 17X23 STRL (DRAPES) ×1 IMPLANT
DURAPREP 26ML APPLICATOR (WOUND CARE) ×1 IMPLANT
ELECT REM PT RETURN 9FT ADLT (ELECTROSURGICAL) ×1
ELECTRODE REM PT RTRN 9FT ADLT (ELECTROSURGICAL) ×1 IMPLANT
GAUZE 4X4 16PLY ~~LOC~~+RFID DBL (SPONGE) IMPLANT
GAUZE SPONGE 4X4 12PLY STRL (GAUZE/BANDAGES/DRESSINGS) IMPLANT
GLOVE ECLIPSE 6.5 STRL STRAW (GLOVE) ×1 IMPLANT
GLOVE EXAM NITRILE XL STR (GLOVE) IMPLANT
GOWN STRL REUS W/ TWL LRG LVL3 (GOWN DISPOSABLE) ×2 IMPLANT
GOWN STRL REUS W/ TWL XL LVL3 (GOWN DISPOSABLE) IMPLANT
GOWN STRL REUS W/TWL 2XL LVL3 (GOWN DISPOSABLE) IMPLANT
GOWN STRL REUS W/TWL LRG LVL3 (GOWN DISPOSABLE) ×2
GOWN STRL REUS W/TWL XL LVL3 (GOWN DISPOSABLE)
KIT BASIN OR (CUSTOM PROCEDURE TRAY) ×1 IMPLANT
KIT TURNOVER KIT B (KITS) ×1 IMPLANT
NDL HYPO 25X1 1.5 SAFETY (NEEDLE) ×1 IMPLANT
NDL SPNL 18GX3.5 QUINCKE PK (NEEDLE) IMPLANT
NEEDLE HYPO 25X1 1.5 SAFETY (NEEDLE) ×1 IMPLANT
NEEDLE SPNL 18GX3.5 QUINCKE PK (NEEDLE) IMPLANT
NS IRRIG 1000ML POUR BTL (IV SOLUTION) ×1 IMPLANT
PACK LAMINECTOMY NEURO (CUSTOM PROCEDURE TRAY) ×1 IMPLANT
PAD ARMBOARD 7.5X6 YLW CONV (MISCELLANEOUS) ×3 IMPLANT
SOL ELECTROSURG ANTI STICK (MISCELLANEOUS) ×1
SOLUTION ELECTROSURG ANTI STCK (MISCELLANEOUS) ×1 IMPLANT
SPIKE FLUID TRANSFER (MISCELLANEOUS) ×1 IMPLANT
SPONGE SURGIFOAM ABS GEL SZ50 (HEMOSTASIS) ×1 IMPLANT
SPONGE T-LAP 4X18 ~~LOC~~+RFID (SPONGE) IMPLANT
STRIP CLOSURE SKIN 1/2X4 (GAUZE/BANDAGES/DRESSINGS) IMPLANT
SUT VIC AB 0 CT1 18XCR BRD8 (SUTURE) ×1 IMPLANT
SUT VIC AB 0 CT1 8-18 (SUTURE) ×1
SUT VIC AB 2-0 CT1 18 (SUTURE) ×1 IMPLANT
SUT VIC AB 3-0 SH 8-18 (SUTURE) ×1 IMPLANT
TOWEL GREEN STERILE (TOWEL DISPOSABLE) ×1 IMPLANT
TOWEL GREEN STERILE FF (TOWEL DISPOSABLE) ×1 IMPLANT
WATER STERILE IRR 1000ML POUR (IV SOLUTION) ×1 IMPLANT

## 2023-02-04 NOTE — Transfer of Care (Signed)
Immediate Anesthesia Transfer of Care Note  Patient: [name]  Procedure(s) Performed: Right Lumbar five-Sacral one Microdiscectomy (Right: Spine Lumbar)  Patient Location: PACU  Anesthesia Type:General  Level of Consciousness: drowsy and patient cooperative  Airway & Oxygen Therapy: Patient Spontanous Breathing and Patient connected to nasal cannula oxygen  Post-op Assessment: Report given to RN, Post -op Vital signs reviewed and stable, Patient moving all extremities X 4, and Patient able to stick tongue midline  Post vital signs: Reviewed and stable  Last Vitals:  Vitals Value Taken Time  BP 125/80 02/04/23 1519  Temp 97.6   Pulse 102 02/04/23 1523  Resp 21 02/04/23 1523  SpO2 100 % 02/04/23 1523  Vitals shown include unvalidated device data.  Last Pain:  Vitals:   02/04/23 1128  TempSrc:   PainSc: 8          Complications:  Encounter Notable Events  Notable Event Outcome Phase Comment  Difficult to intubate - unexpected  Intraprocedure Filed from anesthesia note documentation.

## 2023-02-04 NOTE — Anesthesia Preprocedure Evaluation (Addendum)
Anesthesia Evaluation  Patient identified by MRN, date of birth, ID band Patient awake    Reviewed: Allergy & Precautions, NPO status , Patient's Chart, lab work & pertinent test results  Airway Mallampati: II  TM Distance: >3 FB Neck ROM: Full    Dental  (+) Missing, Poor Dentition   Pulmonary neg pulmonary ROS   Pulmonary exam normal        Cardiovascular negative cardio ROS Normal cardiovascular exam     Neuro/Psych  Headaches PSYCHIATRIC DISORDERS Anxiety Depression Bipolar Disorder      GI/Hepatic negative GI ROS,,,(+)     substance abuse    Endo/Other  negative endocrine ROS    Renal/GU negative Renal ROS     Musculoskeletal negative musculoskeletal ROS (+)    Abdominal   Peds  Hematology negative hematology ROS (+)   Anesthesia Other Findings Other intervertebral disc discplacement, Lumbar region  Reproductive/Obstetrics Hcg negative                             Anesthesia Physical Anesthesia Plan  ASA: 2  Anesthesia Plan: General   Post-op Pain Management:    Induction: Intravenous  PONV Risk Score and Plan: 3 and Ondansetron, Dexamethasone, Midazolam and Treatment may vary due to age or medical condition  Airway Management Planned: Oral ETT  Additional Equipment:   Intra-op Plan:   Post-operative Plan: Extubation in OR  Informed Consent: I have reviewed the patients History and Physical, chart, labs and discussed the procedure including the risks, benefits and alternatives for the proposed anesthesia with the patient or authorized representative who has indicated his/her understanding and acceptance.     Dental advisory given  Plan Discussed with: CRNA  Anesthesia Plan Comments:        Anesthesia Quick Evaluation

## 2023-02-04 NOTE — H&P (Signed)
BP 121/86   Pulse 80   Temp 98.4 F (36.9 C) (Oral)   Resp 20   Ht  (1.6 m)   Wt 74.8 kg   LMP 01/24/2023 (Approximate)   SpO2 98%   BMI 29.23 kg/m     [name] returned today.  She got the injections.  She felt better.  She tried to keep herself away from the job, but she says once she went back, she started to have the pain once more.  Not sure what can be done.  She says that away from the job, she does well.  When she comes back to the job, she does poorly.  She says at this point it is not a possibility for her to give up her job.  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  We know what the problem is, but that problem cannot be suspended.  I did give her some pain medication, Hydrocodone.  She says it will allow to get through work, but as I pointed out, to give you the pain medication so that you can get through work which causes the pain is an oxymoronic way of life.  For right now, while she is thinking about what she would like to do, there is always a chance of doing yet another operation and that taking out the disc at 5-1.  So, she said initially she had felt better after her original operation at 4-5 until she went back to work.  This time, she felt better after the injection at 5-1 until she went back to work. She has full strength in the upper and lower extremities.  Normal muscle tone, bulk, and coordination.  Speech is clear and fluent.  She weighs 169 pounds.  Temperature is 98.7, blood pressure 130/79, pulse 59.  Pain is 8/10.  She will contact me with a decision, and I just arranged for her to come back in a month, but she is going to try to go back to work. Allergies  Allergen Reactions   Fentanyl Nausea And Vomiting   Aluminum Rash   Nickel Rash   Past Medical History:  Diagnosis Date   Abscess    Anginal pain 12/15/2022   normal results w/ testing. Pt states she was told it was "probably a strained muscle" that was causing chest pain   Anxiety     Bipolar disorder    Depression    Genital herpes    Migraines    "from time to time" per pt   Past Surgical History:  Procedure Laterality Date   CERVICAL DISC SURGERY     fusion   LUMBAR LAMINECTOMY/DECOMPRESSION MICRODISCECTOMY N/A 04/10/2022   Procedure: Lumbar three--four, Lumbar four-five Microdisscectomy;  Surgeon: Coletta Memos, MD;  Location[facility] OR;  Service: Neurosurgery;  Laterality: N/A;  RM 21 3C   MOUTH SURGERY     multiple extractions   Prior to Admission medications   Medication Sig Start Date End Date Taking? Authorizing Provider  diazepam (VALIUM) 5 MG tablet Take 1 tablet (5 mg total) by mouth every 6 (six) hours as needed for muscle spasms. 04/11/22  Yes OstergardClovis Pu, MD  HYDROcodone-acetaminophen (NORCO/VICODIN) 5-325 MG tablet Take 1 tablet by mouth 2 (two) times daily. 01/08/23  Yes [provider]  QUEtiapine (SEROQUEL XR) 400 MG 24 hr tablet Take 1 tablet (400 mg total) by mouth at bedtime. 10/13/16 01/29/23 Yes Cardama, Amadeo Garnet, MD  naproxen (NAPROSYN) 500 MG tablet Take  1 tablet (500 mg total) by mouth 2 (two) times daily. Patient not taking: Reported on 01/29/2023 12/15/22   Pamala Duffel   Family History  Problem Relation Age of Onset   Hyperlipidemia Mother    Hypertension Mother    Hyperlipidemia Father    Social History   Socioeconomic History   Marital status: Significant Other    Spouse name: Not on file   Number of children: 1   Years of education: Not on file   Highest education level: Not on file  Occupational History   Not on file  Tobacco Use   Smoking status: Never   Smokeless tobacco: Never  Vaping Use   Vaping Use: Every day  Substance and Sexual Activity   Alcohol use: Not Currently   Drug use: Yes    Types: Marijuana    Comment: 1-2 times per day   Sexual activity: Yes    Birth control/protection: Pill  Other Topics Concern   Not on file  Social History Narrative   Home with fiancee. Daughter  38 years old   Social Determinants of Corporate investment banker Strain: Not on file  Food Insecurity: Not on file  Transportation Needs: Not on file  Physical Activity: Not on file  Stress: Not on file  Social Connections: Not on file  Intimate Partner Violence: Not on file  [name] would like to proceed with operative decompression. She is well versed in the risks and benefits having had the procedure previously.

## 2023-02-04 NOTE — Anesthesia Procedure Notes (Addendum)
Procedure Name: Intubation Date/Time: 02/04/2023 1:49 PM  Performed by: Cy Blamer, CRNAPre-anesthesia Checklist: Patient identified, Emergency Drugs available, Suction available and Patient being monitored Patient Re-evaluated:Patient Re-evaluated prior to induction Oxygen Delivery Method: Circle system utilized Preoxygenation: Pre-oxygenation with 100% oxygen Induction Type: IV induction Ventilation: Mask ventilation without difficulty Laryngoscope Size: Miller and 2 Grade View: Grade I Tube type: Oral Tube size: 7.0 mm Number of attempts: 1 Airway Equipment and Method: Stylet and Bite block Placement Confirmation: ETT inserted through vocal cords under direct vision, positive ETCO2 and breath sounds checked- equal and bilateral Secured at: 22 cm Tube secured with: Tape Dental Injury: Teeth and Oropharynx as per pre-operative assessment  Difficulty Due To: Difficulty was unanticipated Comments: Performed by Natividad Brood SRNA

## 2023-02-04 NOTE — Discharge Instructions (Signed)
Lumbar Discectomy °Care After °A discectomy involves removal of discmaterial (the cartilage-like structures located between the bones of the back). It is done to relieve pressure on nerve roots. It can be used as a treatment for a back problem. The time in surgery depends on the findings in surgery and what is necessary to correct the problems. °HOME CARE INSTRUCTIONS  °· Check the cut (incision) made by the surgeon twice a day for signs of infection. Some signs of infection may include:  °· A foul smelling, greenish or yellowish discharge from the wound.  °· Increased pain.  °· Increased redness over the incision (operative) site.  °· The skin edges may separate.  °· Flu-like symptoms (problems).  °· A temperature above 101.5° F (38.6° C).  °· Change your bandages in about 24 to 36 hours following surgery or as directed.  °· You may shower tomrrow.  Avoid bathtubs, swimming pools and hot tubs for three weeks or until your incision has healed completely. °· Follow your doctor's instructions as to safe activities, exercises, and physical therapy.  °· Weight reduction may be beneficial if you are overweight.  °· Daily exercise is helpful to prevent the return of problems. Walking is permitted. You may use a treadmill without an incline. Cut down on activities and exercise if you have discomfort. You may also go up and down stairs as much as you can tolerate.  °· DO NOT lift anything heavier than 10 to 15 lbs. Avoid bending or twisting at the waist. Always bend your knees when lifting.  °· Maintain strength and range of motion as instructed.  °· Do not drive for 10 days, or as directed by your doctors. You may be a passenger . Lying back in the passenger seat may be more comfortable for you. Always wear a seatbelt.  °· Limit your sitting in a regular chair to 20 to 30 minutes at a time. There are no limitations for sitting in a recliner. You should lie down or walk in between sitting periods.  °· Only take  over-the-counter or prescription medicines for pain, discomfort, or fever as directed by your caregiver.  °SEEK MEDICAL CARE IF:  °· There is increased bleeding (more than a small spot) from the wound.  °· You notice redness, swelling, or increasing pain in the wound.  °· Pus is coming from wound.  °· You develop an unexplained oral temperature above 102° F (38.9° C) develops.  °· You notice a foul smell coming from the wound or dressing.  °· You have increasing pain in your wound.  °SEEK IMMEDIATE MEDICAL CARE IF:  °· You develop a rash.  °· You have difficulty breathing.  °· You develop any allergic problems to medicines given.  °Document Released: 09/09/2004 Document Revised: 09/24/2011 Document Reviewed: 12/29/2007 °ExitCare® Patient Information °

## 2023-02-04 NOTE — Op Note (Signed)
02/04/2023  3:23 PM  PATIENT:  [name]  32 y.o. female  PRE-OPERATIVE DIAGNOSIS:  Other intervertebral disc discplacement, Lumbar region, L5/S1 right  POST-OPERATIVE DIAGNOSIS:  Other intervertebral disc discplacement, Lumbar region Right L5/S1 hnp PROCEDURE:  Procedure(s): Right Lumbar five-Sacral one Microdiscectomy  SURGEON:   Surgeon(s): Coletta Memos, MD  ASSISTANTS:thomas, jonathan  ANESTHESIA:   general  EBL:  Total I/O In: 1200 [I.V.:1000; IV Piggyback:200] Out: 10 [Blood:10]  BLOOD ADMINISTERED:none  CELL SAVER GIVEN:not used  COUNT:per nursing  DRAINS: none   SPECIMEN:  No Specimen  DICTATION: [name] was taken to the operating room, intubated and placed under a general anesthetic without difficulty. She was positioned prone on a Wilson frame with all pressure points padded. Her back was prepped and draped in a sterile manner. I opened the skin with a 10 blade and carried the dissection down to the thoracolumbar fascia. I used both sharp dissection and the monopolar cautery to expose the lamina of L5, and S1. I confirmed my location with an intraoperative xray.  I used the drill, Kerrison punches, and curettes to perform a semihemilaminectomy of L5. I used the punches to remove the ligamentum flavum to expose the thecal sac. I brought the microscope into the operative field and with Dr.Thomas' assistance we started our decompression of the spinal canal, thecal sac and the right S1 root(s). I cauterized epidural veins overlying the disc space then divided them sharply. I opened the disc space with a 15 blade and proceeded with the discectomy. I used pituitary rongeurs, curettes, and other instruments to remove disc material. After the discectomy was completed We inspected the S1 nerve root and felt it was well decompressed. I explored rostrally, laterally, medially, and caudally and was satisfied with the decompression. I irrigated the wound, then closed in  layers. I approximated the thoracolumbar fascia, subcutaneous, and subcuticular planes with vicryl sutures. I used dermabond for a sterile dressing.   PLAN OF CARE: Discharge to home after PACU  PATIENT DISPOSITION:  PACU - hemodynamically stable.   Delay start of Pharmacological VTE agent (>24hrs) due to surgical blood loss or risk of bleeding:  yes

## 2023-02-05 ENCOUNTER — Encounter (HOSPITAL_COMMUNITY): Payer: Self-pay | Admitting: Neurosurgery

## 2023-02-05 DIAGNOSIS — M5127 Other intervertebral disc displacement, lumbosacral region: Secondary | ICD-10-CM | POA: Diagnosis not present

## 2023-02-05 MED ORDER — POLYETHYLENE GLYCOL 3350 17 G PO PACK: 17.0000 g | PACK | Freq: Every day | ORAL | Status: DC

## 2023-02-05 MED FILL — Thrombin For Soln 5000 Unit: CUTANEOUS | Qty: 2 | Status: AC

## 2023-02-05 NOTE — Plan of Care (Signed)
Pt doing well. Pt and family given D/C instructions with verbal understanding. Rx's were sent to the pharmacy by MD. Pt's incision is clean and dry with no sign of infection. Pt's IV was removed prior to D/C. Pt D/C'd home via wheelchair per MD order. Pt is stable @ D/C and has no other needs at this time. Lebron Nauert, RN  

## 2023-02-05 NOTE — Evaluation (Signed)
Physical Therapy Evaluation & Discharge Patient Details Name: [name] MRN: [id number] DOB: 08-08-1991 Today's Date: 02/05/2023  History of Present Illness  Pt is a 32 y.o. female who presented 02/04/23 for same day right L5-S1 microdiscectomy. PMH: abscess, anxiety, bipolar disorder, depression, genital herpes, migraines   Clinical Impression  Pt presents with condition above. PTA, she was independent, unless in pain in which her fiance would then assist with showering. Pt lives with her fiance and 6 y.o. daughter in a mobile home with 4 STE. She is currently ambulating slowly but steadily, primarily demonstrating limitations due to back pain. Pt would intermittently hold onto her fiance with functional mobility for pain management purposes. Pt will likely progress well as her pain improves. All education completed and questions answered. PT will sign off.     Recommendations for follow up therapy are one component of a multi-disciplinary discharge planning process, led by the attending physician.  Recommendations may be updated based on patient status, additional functional criteria and insurance authorization.  Follow Up Recommendations       Assistance Recommended at Discharge PRN  Patient can return home with the following  Assistance with cooking/housework;Help with stairs or ramp for entrance    Equipment Recommendations None recommended by PT  Recommendations for Other Services       Functional Status Assessment Patient has not had a recent decline in their functional status     Precautions / Restrictions Precautions Precautions: Back Precaution Booklet Issued: Yes (comment) Precaution Comments: reviewed precautions Required Braces or Orthoses:  (no brace needed orders) Restrictions Weight Bearing Restrictions: No      Mobility  Bed Mobility Overal bed mobility: Modified Independent             General bed mobility comments: ABle to perform bed mobility  aspects without assistance    Transfers Overall transfer level: Independent Equipment used: None               General transfer comment: No assistance needed    Ambulation/Gait Ambulation/Gait assistance: Modified independent (Device/Increase time) Gait Distance (Feet): 340 Feet Assistive device: None, 1 person hand held assist Gait Pattern/deviations: Step-through pattern, Decreased stride length Gait velocity: reduced Gait velocity interpretation: 1.31 - 2.62 ft/sec, indicative of limited community ambulator   General Gait Details: Pt with slow, short steps due to back pain, intermittently holding onto fiance's arm for pain management, but no assistance required  Stairs Stairs: Yes Stairs assistance: Supervision Stair Management: One rail Right, One rail Left, Alternating pattern, Forwards Number of Stairs: 10 General stair comments: Ascends with L rail and descends with R rail, reciprocal pattern, slow but steady, supervision for safety and pt hodling onto fiances arm descending for confidence but not needed  Wheelchair Mobility    Modified Rankin (Stroke Patients Only)       Balance Overall balance assessment: No apparent balance deficits (not formally assessed)                                           Pertinent Vitals/Pain Pain Assessment Pain Assessment: Faces Faces Pain Scale: Hurts little more Pain Location: back Pain Descriptors / Indicators: Discomfort, Operative site guarding Pain Intervention(s): Monitored during session, Limited activity within patient's tolerance, Repositioned    Home Living Family/patient expects to be discharged to:: Private residence Living Arrangements: Spouse/significant other;Children (daughter) Available Help at Discharge: Family;Available 24 hours/day  Type of Home: Mobile home Home Access: Stairs to enter Entrance Stairs-Rails: Can reach both Entrance Stairs-Number of Steps: 4   Home Layout: One  level Home Equipment: Agricultural consultant (2 wheels);Rollator (4 wheels);Wheelchair - Manufacturing systems engineer;Other (comment) (adjustable bed)      Prior Function Prior Level of Function : Independent/Modified Independent             Mobility Comments: No AD ADLs Comments: Independent when not in pain, when in pain she sist on shower chair in tub and fiance assists with bathing; quit her job recently, looking at home jobs     Hand Dominance        Extremity/Trunk Assessment   Upper Extremity Assessment Upper Extremity Assessment: Overall WFL for tasks assessed    Lower Extremity Assessment Lower Extremity Assessment: Overall WFL for tasks assessed (reported some mild tingling in her R foot)    Cervical / Trunk Assessment Cervical / Trunk Assessment: Back Surgery  Communication   Communication: No difficulties  Cognition Arousal/Alertness: Awake/alert Behavior During Therapy: WFL for tasks assessed/performed Overall Cognitive Status: Within Functional Limits for tasks assessed                                          General Comments General comments (skin integrity, edema, etc.): educated pt on ADLs while maintaining precautions, car transfers,a nd frequent mobility    Exercises     Assessment/Plan    PT Assessment Patient does not need any further PT services  PT Problem List         PT Treatment Interventions      PT Goals (Current goals can be found in the Care Plan section)  Acute Rehab PT Goals Patient Stated Goal: to improve to get married in September PT Goal Formulation: All assessment and education complete, DC therapy Time For Goal Achievement: 02/06/23 Potential to Achieve Goals: Good    Frequency       Co-evaluation               AM-PAC PT "6 Clicks" Mobility  Outcome Measure Help needed turning from your back to your side while in a flat bed without using bedrails?: None Help needed moving from lying on your back to  sitting on the side of a flat bed without using bedrails?: None Help needed moving to and from a bed to a chair (including a wheelchair)?: None Help needed standing up from a chair using your arms (e.g., wheelchair or bedside chair)?: None Help needed to walk in hospital room?: None Help needed climbing 3-5 steps with a railing? : A Little 6 Click Score: 23    End of Session   Activity Tolerance: Patient tolerated treatment well Patient left: in bed;with call bell/phone within reach;with family/visitor present Nurse Communication: Mobility status PT Visit Diagnosis: Other abnormalities of gait and mobility (R26.89)    Time: 4098-1191 PT Time Calculation (min) (ACUTE ONLY): 26 min   Charges:   PT Evaluation $PT Eval Low Complexity: 1 Low PT Treatments $Therapeutic Activity: 8-22 mins        Raymond Gurney, PT, DPT Acute Rehabilitation Services  Office: [phone]   Jewel Baize 02/05/2023, 9:02 AM

## 2023-02-05 NOTE — Anesthesia Postprocedure Evaluation (Signed)
Anesthesia Post Note  Patient: CHAITRA MAST  Procedure(s) Performed: Right Lumbar five-Sacral one Microdiscectomy (Right: Spine Lumbar)     Patient location during evaluation: PACU Anesthesia Type: General Level of consciousness: awake Pain management: pain level controlled Vital Signs Assessment: post-procedure vital signs reviewed and stable Respiratory status: spontaneous breathing, nonlabored ventilation and respiratory function stable Cardiovascular status: blood pressure returned to baseline and stable Postop Assessment: no apparent nausea or vomiting Anesthetic complications: yes   Encounter Notable Events  Notable Event Outcome Phase Comment  Difficult to intubate - unexpected  Intraprocedure Filed from anesthesia note documentation.    Last Vitals:  Vitals:   02/05/23 0546 02/05/23 0738  BP: 102/60 102/63  Pulse: 75 74  Resp: 18 18  Temp: 36.9 C 36.9 C  SpO2: 98% 99%    Last Pain:  Vitals:   02/05/23 0945  TempSrc:   PainSc: 8    Pain Goal: Patients Stated Pain Goal: 2 (02/05/23 0945)                 Catheryn Bacon Danyeal Akens

## 2023-02-05 NOTE — Progress Notes (Signed)
OT Cancellation Note  Patient Details Name: [name] MRN: [id number] DOB: July 22, 1991   Cancelled Treatment:    Reason Eval/Treat Not Completed: OT screened, no needs identified, will sign off  Discussed pt functional level with PT and met with pt and her fiance to address any questions and determine any additional skilled OT needs. No needs for skilled OT services or DME/AE identified, will sign off.  Othel Hoogendoorn "Orson Eva., OTR/L, MA Acute Rehab [phone]  Lendon Colonel 02/05/2023, 9:04 AM

## 2023-04-23 NOTE — Discharge Summary (Signed)
Physician Discharge Summary  Patient ID: DEVANEY LACAYO MRN: [id number] DOB/AGE: 1991-08-27 32 y.o.  Admit date: 02/04/2023 Discharge date: 04/23/2023  Admission Diagnoses:HNP L5/S1  Discharge Diagnoses:  Principal Problem:   HNP (herniated nucleus pulposus), lumbar   Discharged Condition: good  Hospital Course: Ms. Helmly was admitted and taken to the operating room for an uncomplicated L5/S1 discetomy. Post op she is ambulating, voiding, and tolerating a regular diet. Her wound is clean, dry, and without signs of infection.   Treatments: surgery: Right Lumbar five-Sacral one Microdiscectomy   Discharge Exam: Blood pressure 102/63, pulse 74, temperature 98.4 F (36.9 C), temperature source Oral, resp. rate 18, height 5\' 3"  (1.6 m), weight 74.8 kg, last menstrual period 01/24/2023, SpO2 99 %. General appearance: alert, cooperative, appears stated age, and no distress  Disposition: Discharge disposition: 01-Home or Self Care      Other intervertebral disc discplacement, Lumbar region Discharge Instructions     Discharge instructions   Complete by: As directed       Allergies as of 02/05/2023       Reactions   Fentanyl Nausea And Vomiting   Aluminum Rash   Nickel Rash        Medication List     TAKE these medications    diazepam 5 MG tablet Commonly known as: VALIUM Take 1 tablet (5 mg total) by mouth every 6 (six) hours as needed for muscle spasms.   HYDROcodone-acetaminophen 5-325 MG tablet Commonly known as: NORCO/VICODIN Take 1 tablet by mouth 2 (two) times daily.   naproxen 500 MG tablet Commonly known as: NAPROSYN Take 1 tablet (500 mg total) by mouth 2 (two) times daily.   QUEtiapine 400 MG 24 hr tablet Commonly known as: SEROQUEL XR Take 1 tablet (400 mg total) by mouth at bedtime.        Follow-up Information     Coletta Memos, MD Follow up.   Specialty: Neurosurgery Why: keep your scheduled appointment Contact information: [address] [place] [address] [phone]                 Signed: Coletta Memos 04/23/2023, 2:17 PM

## 2023-07-07 ENCOUNTER — Ambulatory Visit: Payer: Medicaid Other | Attending: Neurosurgery

## 2023-07-07 ENCOUNTER — Other Ambulatory Visit: Payer: Self-pay

## 2023-07-07 DIAGNOSIS — M5459 Other low back pain: Secondary | ICD-10-CM | POA: Insufficient documentation

## 2023-07-07 DIAGNOSIS — M7918 Myalgia, other site: Secondary | ICD-10-CM | POA: Insufficient documentation

## 2023-07-07 DIAGNOSIS — M6281 Muscle weakness (generalized): Secondary | ICD-10-CM | POA: Diagnosis present

## 2023-07-07 NOTE — Therapy (Signed)
OUTPATIENT PHYSICAL THERAPY THORACOLUMBAR EVALUATION   Patient Name: [name] MRN: [id number] DOB:12-18-1990, 32 y.o., female Today's Date: 07/07/2023  END OF SESSION:  PT End of Session - 07/07/23 1331     Visit Number 1    Number of Visits 12    Date for PT Re-Evaluation 09/06/23    Authorization Type MCD    PT Start Time 1130    PT Stop Time 1215    PT Time Calculation (min) 45 min    Activity Tolerance Patient limited by pain;Patient tolerated treatment well    Behavior During Therapy [facility] for tasks assessed/performed;Anxious             Past Medical History:  Diagnosis Date   Abscess    Anginal pain (HCC) 12/15/2022   normal results w/ testing. Pt states she was told it was "probably a strained muscle" that was causing chest pain   Anxiety    Bipolar disorder (HCC)    Depression    Genital herpes    Migraines    "from time to time" per pt   Past Surgical History:  Procedure Laterality Date   CERVICAL DISC SURGERY     fusion   LUMBAR LAMINECTOMY/DECOMPRESSION MICRODISCECTOMY N/A 04/10/2022   Procedure: Lumbar three--four, Lumbar four-five Microdisscectomy;  Surgeon: Coletta Memos, MD;  Location: [facility] OR;  Service: Neurosurgery;  Laterality: N/A;  RM 21 3C   LUMBAR LAMINECTOMY/DECOMPRESSION MICRODISCECTOMY Right 02/04/2023   Procedure: Right Lumbar five-Sacral one Microdiscectomy;  Surgeon: Coletta Memos, MD;  Location: [facility] OR;  Service: Neurosurgery;  Laterality: Right;   MOUTH SURGERY     multiple extractions   Patient Active Problem List   Diagnosis Date Noted   HNP (herniated nucleus pulposus), lumbar 04/10/2022   SOB (shortness of breath) 05/30/2014   Other chest pain 05/30/2014   Rapid heart beat 05/30/2014   Panic attacks 05/30/2014   Anxiety 05/30/2014    [facility]P: [facility]   REFERRING PROVIDER: Coletta Memos, MD  REFERRING DIAG: M51.26 (ICD-10-CM) - Other intervertebral disc displacement, lumbar region  Rationale  for Evaluation and Treatment: Rehabilitation  THERAPY DIAG:  Other low back pain  Muscle weakness (generalized)  Piriformis muscle pain  ONSET DATE: 01/2023  SUBJECTIVE:                                                                                                                                                                                           SUBJECTIVE STATEMENT: Describes a constant ache in low back.  Has had 2 lumbar surgeries to date as well as cervical fusion.  PERTINENT HISTORY:  Pt is a  32 y.o. female who presented 02/04/23 for same day right L5-S1 microdiscectomy. PMH: abscess, anxiety, bipolar disorder, depression, genital herpes, migraines   PAIN:  Are you having pain? Yes: NPRS scale: 10/10 Pain location: low back Pain description: ache Aggravating factors: bending and lifting tasks Relieving factors: rest   PRECAUTIONS: Back  RED FLAGS: None   WEIGHT BEARING RESTRICTIONS: No  FALLS:  Has patient fallen in last 6 months? No  OCCUPATION: not working  PLOF: Independent  PATIENT GOALS: To manage my back symptoms  NEXT MD VISIT: November 2024  OBJECTIVE:   DIAGNOSTIC FINDINGS:  LUMBAR SPINE - 1 VIEW   COMPARISON:  04/10/2022   FINDINGS: Cross-table lateral image of the lumbar spine was obtained during an operative procedure for localization purposes. Based on previous numbering convention, there is lumbarization of the S1 vertebral body, with a rudimentary disc at the S1/S2 level. Surgical instrument is seen overlying the L5-S1 inter spinous region.   IMPRESSION: 1. Surgical instrumentation overlying the L5-S1 interspinous region.     Electronically Signed   By: Sharlet Salina M.D.   On: 02/04/2023 15:26  PATIENT SURVEYS:  FOTO 38(47 predicted)  SCREENING FOR RED FLAGS: Bowel or bladder incontinence: No  MUSCLE LENGTH: Hamstrings: Right 70 deg; Left 90 deg Thomas test: unremarkable  POSTURE: No Significant postural  limitations  PALPATION: TTP R piriformis  LUMBAR ROM: deferred  AROM eval  Flexion   Extension   Right lateral flexion   Left lateral flexion   Right rotation   Left rotation    (Blank rows = not tested)  LOWER EXTREMITY ROM:   WFL B  Passive  Right eval Left eval  Hip flexion    Hip extension    Hip abduction    Hip adduction    Hip internal rotation    Hip external rotation    Knee flexion    Knee extension    Ankle dorsiflexion    Ankle plantarflexion    Ankle inversion    Ankle eversion     (Blank rows = not tested)  LOWER EXTREMITY MMT:    MMT Right eval Left eval  Hip flexion 4- 4  Hip extension 4- 4  Hip abduction 4- 4  Hip adduction    Hip internal rotation    Hip external rotation    Knee flexion 4- 4  Knee extension 4- 4  Ankle dorsiflexion    Ankle plantarflexion 4- 4  Ankle inversion    Ankle eversion     (Blank rows = not tested)  LUMBAR SPECIAL TESTS:  Straight leg raise test: Negative, Slump test: Negative, and piriformis  FUNCTIONAL TESTS:  5 times sit to stand: 23s  GAIT: Distance walked: 66ftx2 Assistive device utilized: None Level of assistance: Complete Independence Comments: slight antalgia  TODAY'S TREATMENT:  DATE: 07/07/23 Eval    PATIENT EDUCATION:  Education details: Discussed eval findings, rehab rationale and POC and patient is in agreement  Person educated: Patient Education method: Explanation Education comprehension: verbalized understanding and needs further education  HOME EXERCISE PROGRAM: Access Code: [email or web address] URL: [email or web address] Date: 07/07/2023 Prepared by: Gustavus Bryant  Exercises - Static Prone on Elbows  - 2 x daily - 5 x weekly - 1 sets - 1 reps - 120s hold - Supine Piriformis Stretch with Foot on Ground  - 2 x daily - 5 x weekly - 1 sets - 2 reps -  30s hold - Sit to Stand with Arms Crossed  - 2 x daily - 5 x weekly - 1 sets - 5 reps  ASSESSMENT:  CLINICAL IMPRESSION: Patient is a 32 y.o. female who was seen today for physical therapy evaluation and treatment for low back pain. Patient presents with strength deficits in RLE due to disuse atrophy.  Marked soft tissue irritation noted in R piriformis.  Increased 5x STS time confirms soft tissue irritation contributing to symptoms.  Core and trunk strength deficits identified as well.  No neuro tension signs elicited but R hamstring tightness noted.  OBJECTIVE IMPAIRMENTS: Abnormal gait, decreased activity tolerance, decreased endurance, decreased knowledge of condition, decreased mobility, decreased ROM, decreased strength, impaired flexibility, improper body mechanics, postural dysfunction, and pain.   ACTIVITY LIMITATIONS: carrying, lifting, bending, and standing  PARTICIPATION LIMITATIONS: occupation  PERSONAL FACTORS: Age, Past/current experiences, and Time since onset of injury/illness/exacerbation are also affecting patient's functional outcome.   REHAB POTENTIAL: Good  CLINICAL DECISION MAKING: Stable/uncomplicated  EVALUATION COMPLEXITY: Low   GOALS: Goals reviewed with patient? No  SHORT TERM GOALS: Target date: 07/28/2023  Patient to demonstrate independence in HEP  Baseline: [email or web address] Goal status: INITIAL  2.  Decrease R piriformis irritation from strong to moderate Baseline: Moderate tenderness Goal status: INITIAL  3.  Improve R hamstring flexibility to 90d Baseline: 70d Goal status: INITIAL   LONG TERM GOALS: Target date: 08/18/2023    Patient will score at least 47% on FOTO to signify clinically meaningful improvement in functional abilities.   Baseline: 38 Goal status: INITIAL  2.  4/10 pain on average Baseline: 10/10 at worst Goal status: INITIAL  3.  Increase RLE strength to 4/5 Baseline:  MMT Right eval Left eval  Hip flexion 4- 4  Hip  extension 4- 4  Hip abduction 4- 4  Hip adduction    Hip internal rotation    Hip external rotation    Knee flexion 4- 4  Knee extension 4- 4  Ankle dorsiflexion    Ankle plantarflexion 4- 4   Goal status: INITIAL  4.  Decrease 5x STS time to 15s Baseline: 23s  Goal status: INITIAL    PLAN:  PT FREQUENCY: 1-2x/week  PT DURATION: 6 weeks  PLANNED INTERVENTIONS: Therapeutic exercises, Therapeutic activity, Neuromuscular re-education, Balance training, Gait training, Patient/Family education, Self Care, Joint mobilization, Aquatic Therapy, Dry Needling, Electrical stimulation, Spinal mobilization, Cryotherapy, Moist heat, Manual therapy, and Re-evaluation.  PLAN FOR NEXT SESSION: HEP review and update, manual techniques as appropriate, aerobic tasks, ROM and flexibility activities, strengthening and PREs, TPDN, gait and balance training as needed     Hildred Laser, PT 07/07/2023, 1:52 PM   Check all possible CPT codes: 16109 - PT Re-evaluation, 97110- Therapeutic Exercise, [id number]- Neuro Re-education, [phone] - Gait Training, [phone] - Manual Therapy, 97530 - Therapeutic Activities, and 97535 - Self Care    Check all conditions  that are expected to impact treatment: {Conditions expected to impact treatment:Complications related to surgery   If treatment provided at initial evaluation, no treatment charged due to lack of authorization.

## 2023-07-09 NOTE — Therapy (Deleted)
OUTPATIENT PHYSICAL THERAPY THORACOLUMBAR EVALUATION   Patient Name: [name] MRN: [id number] DOB:28-Apr-1991, 32 y.o., female Today's Date: 07/09/2023  END OF SESSION:    Past Medical History:  Diagnosis Date   Abscess    Anginal pain (HCC) 12/15/2022   normal results w/ testing. Pt states she was told it was "probably a strained muscle" that was causing chest pain   Anxiety    Bipolar disorder (HCC)    Depression    Genital herpes    Migraines    "from time to time" per pt   Past Surgical History:  Procedure Laterality Date   CERVICAL DISC SURGERY     fusion   LUMBAR LAMINECTOMY/DECOMPRESSION MICRODISCECTOMY N/A 04/10/2022   Procedure: Lumbar three--four, Lumbar four-five Microdisscectomy;  Surgeon: Coletta Memos, MD;  Location: [facility] OR;  Service: Neurosurgery;  Laterality: N/A;  RM 21 3C   LUMBAR LAMINECTOMY/DECOMPRESSION MICRODISCECTOMY Right 02/04/2023   Procedure: Right Lumbar five-Sacral one Microdiscectomy;  Surgeon: Coletta Memos, MD;  Location: [facility] OR;  Service: Neurosurgery;  Laterality: Right;   MOUTH SURGERY     multiple extractions   Patient Active Problem List   Diagnosis Date Noted   HNP (herniated nucleus pulposus), lumbar 04/10/2022   SOB (shortness of breath) 05/30/2014   Other chest pain 05/30/2014   Rapid heart beat 05/30/2014   Panic attacks 05/30/2014   Anxiety 05/30/2014    [facility]P: [facility]   REFERRING PROVIDER: Coletta Memos, MD  REFERRING DIAG: M51.26 (ICD-10-CM) - Other intervertebral disc displacement, lumbar region  Rationale for Evaluation and Treatment: Rehabilitation  THERAPY DIAG:  No diagnosis found.  ONSET DATE: 01/2023  SUBJECTIVE:                                                                                                                                                                                           SUBJECTIVE STATEMENT: Describes a constant ache in low back.  Has had 2 lumbar  surgeries to date as well as cervical fusion.  PERTINENT HISTORY:  Pt is a 32 y.o. female who presented 02/04/23 for same day right L5-S1 microdiscectomy. PMH: abscess, anxiety, bipolar disorder, depression, genital herpes, migraines   PAIN:  Are you having pain? Yes: NPRS scale: 10/10 Pain location: low back Pain description: ache Aggravating factors: bending and lifting tasks Relieving factors: rest   PRECAUTIONS: Back  RED FLAGS: None   WEIGHT BEARING RESTRICTIONS: No  FALLS:  Has patient fallen in last 6 months? No  OCCUPATION: not working  PLOF: Independent  PATIENT GOALS: To manage my back symptoms  NEXT MD VISIT: November 2024  OBJECTIVE:  DIAGNOSTIC FINDINGS:  LUMBAR SPINE - 1 VIEW   COMPARISON:  04/10/2022   FINDINGS: Cross-table lateral image of the lumbar spine was obtained during an operative procedure for localization purposes. Based on previous numbering convention, there is lumbarization of the S1 vertebral body, with a rudimentary disc at the S1/S2 level. Surgical instrument is seen overlying the L5-S1 inter spinous region.   IMPRESSION: 1. Surgical instrumentation overlying the L5-S1 interspinous region.     Electronically Signed   By: Sharlet Salina M.D.   On: 02/04/2023 15:26  PATIENT SURVEYS:  FOTO 38(47 predicted)  SCREENING FOR RED FLAGS: Bowel or bladder incontinence: No  MUSCLE LENGTH: Hamstrings: Right 70 deg; Left 90 deg Thomas test: unremarkable  POSTURE: No Significant postural limitations  PALPATION: TTP R piriformis  LUMBAR ROM: deferred  AROM eval  Flexion   Extension   Right lateral flexion   Left lateral flexion   Right rotation   Left rotation    (Blank rows = not tested)  LOWER EXTREMITY ROM:   WFL B  Passive  Right eval Left eval  Hip flexion    Hip extension    Hip abduction    Hip adduction    Hip internal rotation    Hip external rotation    Knee flexion    Knee extension    Ankle  dorsiflexion    Ankle plantarflexion    Ankle inversion    Ankle eversion     (Blank rows = not tested)  LOWER EXTREMITY MMT:    MMT Right eval Left eval  Hip flexion 4- 4  Hip extension 4- 4  Hip abduction 4- 4  Hip adduction    Hip internal rotation    Hip external rotation    Knee flexion 4- 4  Knee extension 4- 4  Ankle dorsiflexion    Ankle plantarflexion 4- 4  Ankle inversion    Ankle eversion     (Blank rows = not tested)  LUMBAR SPECIAL TESTS:  Straight leg raise test: Negative, Slump test: Negative, and piriformis  FUNCTIONAL TESTS:  5 times sit to stand: 23s  GAIT: Distance walked: 75ftx2 Assistive device utilized: None Level of assistance: Complete Independence Comments: slight antalgia  TODAY'S TREATMENT:                                                                                                                              DATE: 07/07/23 Eval    PATIENT EDUCATION:  Education details: Discussed eval findings, rehab rationale and POC and patient is in agreement  Person educated: Patient Education method: Explanation Education comprehension: verbalized understanding and needs further education  HOME EXERCISE PROGRAM: Access Code: [email or web address] URL: [email or web address] Date: 07/07/2023 Prepared by: Gustavus Bryant  Exercises - Static Prone on Elbows  - 2 x daily - 5 x weekly - 1 sets - 1 reps - 120s hold - Supine Piriformis Stretch with Foot on Ground  - 2 x daily - 5  x weekly - 1 sets - 2 reps - 30s hold - Sit to Stand with Arms Crossed  - 2 x daily - 5 x weekly - 1 sets - 5 reps  ASSESSMENT:  CLINICAL IMPRESSION: Patient is a 32 y.o. female who was seen today for physical therapy evaluation and treatment for low back pain. Patient presents with strength deficits in RLE due to disuse atrophy.  Marked soft tissue irritation noted in R piriformis.  Increased 5x STS time confirms soft tissue irritation contributing to symptoms.  Core  and trunk strength deficits identified as well.  No neuro tension signs elicited but R hamstring tightness noted.  OBJECTIVE IMPAIRMENTS: Abnormal gait, decreased activity tolerance, decreased endurance, decreased knowledge of condition, decreased mobility, decreased ROM, decreased strength, impaired flexibility, improper body mechanics, postural dysfunction, and pain.   ACTIVITY LIMITATIONS: carrying, lifting, bending, and standing  PARTICIPATION LIMITATIONS: occupation  PERSONAL FACTORS: Age, Past/current experiences, and Time since onset of injury/illness/exacerbation are also affecting patient's functional outcome.   REHAB POTENTIAL: Good  CLINICAL DECISION MAKING: Stable/uncomplicated  EVALUATION COMPLEXITY: Low   GOALS: Goals reviewed with patient? No  SHORT TERM GOALS: Target date: 07/28/2023  Patient to demonstrate independence in HEP  Baseline: [email or web address] Goal status: INITIAL  2.  Decrease R piriformis irritation from strong to moderate Baseline: Moderate tenderness Goal status: INITIAL  3.  Improve R hamstring flexibility to 90d Baseline: 70d Goal status: INITIAL   LONG TERM GOALS: Target date: 08/18/2023    Patient will score at least 47% on FOTO to signify clinically meaningful improvement in functional abilities.   Baseline: 38 Goal status: INITIAL  2.  4/10 pain on average Baseline: 10/10 at worst Goal status: INITIAL  3.  Increase RLE strength to 4/5 Baseline:  MMT Right eval Left eval  Hip flexion 4- 4  Hip extension 4- 4  Hip abduction 4- 4  Hip adduction    Hip internal rotation    Hip external rotation    Knee flexion 4- 4  Knee extension 4- 4  Ankle dorsiflexion    Ankle plantarflexion 4- 4   Goal status: INITIAL  4.  Decrease 5x STS time to 15s Baseline: 23s  Goal status: INITIAL    PLAN:  PT FREQUENCY: 1-2x/week  PT DURATION: 6 weeks  PLANNED INTERVENTIONS: Therapeutic exercises, Therapeutic activity, Neuromuscular  re-education, Balance training, Gait training, Patient/Family education, Self Care, Joint mobilization, Aquatic Therapy, Dry Needling, Electrical stimulation, Spinal mobilization, Cryotherapy, Moist heat, Manual therapy, and Re-evaluation.  PLAN FOR NEXT SESSION: HEP review and update, manual techniques as appropriate, aerobic tasks, ROM and flexibility activities, strengthening and PREs, TPDN, gait and balance training as needed     Hildred Laser, PT 07/09/2023, 12:29 PM   Check all possible CPT codes: 81191 - PT Re-evaluation, 97110- Therapeutic Exercise, [phone]- Neuro Re-education, [phone] - Gait Training, [phone] - Manual Therapy, [phone] - Therapeutic Activities, and [phone] - Self Care    Check all conditions that are expected to impact treatment: {Conditions expected to impact treatment:Complications related to surgery   If treatment provided at initial evaluation, no treatment charged due to lack of authorization.

## 2023-07-13 ENCOUNTER — Ambulatory Visit: Payer: Medicaid Other

## 2023-07-19 NOTE — Therapy (Unsigned)
OUTPATIENT PHYSICAL THERAPY THORACOLUMBAR EVALUATION   Patient Name: [name] MRN: [id number] DOB:[birth year], 32 y.o., female Today's Date: 07/19/2023  END OF SESSION:    Past Medical History:  Diagnosis Date   Abscess    Anginal pain (HCC) 12/15/2022   normal results w/ testing. Pt states she was told it was "probably a strained muscle" that was causing chest pain   Anxiety    Bipolar disorder (HCC)    Depression    Genital herpes    Migraines    "from time to time" per pt   Past Surgical History:  Procedure Laterality Date   CERVICAL DISC SURGERY     fusion   LUMBAR LAMINECTOMY/DECOMPRESSION MICRODISCECTOMY N/A 04/10/2022   Procedure: Lumbar three--four, Lumbar four-five Microdisscectomy;  Surgeon: Coletta Memos, MD;  Location: [facility] OR;  Service: Neurosurgery;  Laterality: N/A;  RM 21 3C   LUMBAR LAMINECTOMY/DECOMPRESSION MICRODISCECTOMY Right 02/04/2023   Procedure: Right Lumbar five-Sacral one Microdiscectomy;  Surgeon: Coletta Memos, MD;  Location: [facility] OR;  Service: Neurosurgery;  Laterality: Right;   MOUTH SURGERY     multiple extractions   Patient Active Problem List   Diagnosis Date Noted   HNP (herniated nucleus pulposus), lumbar 04/10/2022   SOB (shortness of breath) 05/30/2014   Other chest pain 05/30/2014   Rapid heart beat 05/30/2014   Panic attacks 05/30/2014   Anxiety 05/30/2014    [facility]P: [facility]   REFERRING PROVIDER: Coletta Memos, MD  REFERRING DIAG: M51.26 (ICD-10-CM) - Other intervertebral disc displacement, lumbar region  Rationale for Evaluation and Treatment: Rehabilitation  THERAPY DIAG:  No diagnosis found.  ONSET DATE: 01/2023  SUBJECTIVE:                                                                                                                                                                                           SUBJECTIVE STATEMENT: Describes a constant ache in low back.  Has had 2 lumbar  surgeries to date as well as cervical fusion.  PERTINENT HISTORY:  Pt is a 32 y.o. female who presented 02/04/23 for same day right L5-S1 microdiscectomy. PMH: abscess, anxiety, bipolar disorder, depression, genital herpes, migraines   PAIN:  Are you having pain? Yes: NPRS scale: 10/10 Pain location: low back Pain description: ache Aggravating factors: bending and lifting tasks Relieving factors: rest   PRECAUTIONS: Back  RED FLAGS: None   WEIGHT BEARING RESTRICTIONS: No  FALLS:  Has patient fallen in last 6 months? No  OCCUPATION: not working  PLOF: Independent  PATIENT GOALS: To manage my back symptoms  NEXT MD VISIT: November 2024  OBJECTIVE:  DIAGNOSTIC FINDINGS:  LUMBAR SPINE - 1 VIEW   COMPARISON:  04/10/2022   FINDINGS: Cross-table lateral image of the lumbar spine was obtained during an operative procedure for localization purposes. Based on previous numbering convention, there is lumbarization of the S1 vertebral body, with a rudimentary disc at the S1/S2 level. Surgical instrument is seen overlying the L5-S1 inter spinous region.   IMPRESSION: 1. Surgical instrumentation overlying the L5-S1 interspinous region.     Electronically Signed   By: Sharlet Salina M.D.   On: 02/04/2023 15:26  PATIENT SURVEYS:  FOTO 38(47 predicted)  SCREENING FOR RED FLAGS: Bowel or bladder incontinence: No  MUSCLE LENGTH: Hamstrings: Right 70 deg; Left 90 deg Thomas test: unremarkable  POSTURE: No Significant postural limitations  PALPATION: TTP R piriformis  LUMBAR ROM: deferred  AROM eval  Flexion   Extension   Right lateral flexion   Left lateral flexion   Right rotation   Left rotation    (Blank rows = not tested)  LOWER EXTREMITY ROM:   WFL B  Passive  Right eval Left eval  Hip flexion    Hip extension    Hip abduction    Hip adduction    Hip internal rotation    Hip external rotation    Knee flexion    Knee extension    Ankle  dorsiflexion    Ankle plantarflexion    Ankle inversion    Ankle eversion     (Blank rows = not tested)  LOWER EXTREMITY MMT:    MMT Right eval Left eval  Hip flexion 4- 4  Hip extension 4- 4  Hip abduction 4- 4  Hip adduction    Hip internal rotation    Hip external rotation    Knee flexion 4- 4  Knee extension 4- 4  Ankle dorsiflexion    Ankle plantarflexion 4- 4  Ankle inversion    Ankle eversion     (Blank rows = not tested)  LUMBAR SPECIAL TESTS:  Straight leg raise test: Negative, Slump test: Negative, and piriformis  FUNCTIONAL TESTS:  5 times sit to stand: 23s  GAIT: Distance walked: 16ftx2 Assistive device utilized: None Level of assistance: Complete Independence Comments: slight antalgia  TODAY'S TREATMENT:                                                                                                                              DATE: 07/07/23 Eval    PATIENT EDUCATION:  Education details: Discussed eval findings, rehab rationale and POC and patient is in agreement  Person educated: Patient Education method: Explanation Education comprehension: verbalized understanding and needs further education  HOME EXERCISE PROGRAM: Access Code: [email or web address] URL: [email or web address] Date: 07/07/2023 Prepared by: Gustavus Bryant  Exercises - Static Prone on Elbows  - 2 x daily - 5 x weekly - 1 sets - 1 reps - 120s hold - Supine Piriformis Stretch with Foot on Ground  - 2 x daily - 5  x weekly - 1 sets - 2 reps - 30s hold - Sit to Stand with Arms Crossed  - 2 x daily - 5 x weekly - 1 sets - 5 reps  ASSESSMENT:  CLINICAL IMPRESSION: Patient is a 32 y.o. female who was seen today for physical therapy evaluation and treatment for low back pain. Patient presents with strength deficits in RLE due to disuse atrophy.  Marked soft tissue irritation noted in R piriformis.  Increased 5x STS time confirms soft tissue irritation contributing to symptoms.  Core  and trunk strength deficits identified as well.  No neuro tension signs elicited but R hamstring tightness noted.  OBJECTIVE IMPAIRMENTS: Abnormal gait, decreased activity tolerance, decreased endurance, decreased knowledge of condition, decreased mobility, decreased ROM, decreased strength, impaired flexibility, improper body mechanics, postural dysfunction, and pain.   ACTIVITY LIMITATIONS: carrying, lifting, bending, and standing  PARTICIPATION LIMITATIONS: occupation  PERSONAL FACTORS: Age, Past/current experiences, and Time since onset of injury/illness/exacerbation are also affecting patient's functional outcome.   REHAB POTENTIAL: Good  CLINICAL DECISION MAKING: Stable/uncomplicated  EVALUATION COMPLEXITY: Low   GOALS: Goals reviewed with patient? No  SHORT TERM GOALS: Target date: 07/28/2023  Patient to demonstrate independence in HEP  Baseline: [email or web address] Goal status: INITIAL  2.  Decrease R piriformis irritation from strong to moderate Baseline: Moderate tenderness Goal status: INITIAL  3.  Improve R hamstring flexibility to 90d Baseline: 70d Goal status: INITIAL   LONG TERM GOALS: Target date: 08/18/2023    Patient will score at least 47% on FOTO to signify clinically meaningful improvement in functional abilities.   Baseline: 38 Goal status: INITIAL  2.  4/10 pain on average Baseline: 10/10 at worst Goal status: INITIAL  3.  Increase RLE strength to 4/5 Baseline:  MMT Right eval Left eval  Hip flexion 4- 4  Hip extension 4- 4  Hip abduction 4- 4  Hip adduction    Hip internal rotation    Hip external rotation    Knee flexion 4- 4  Knee extension 4- 4  Ankle dorsiflexion    Ankle plantarflexion 4- 4   Goal status: INITIAL  4.  Decrease 5x STS time to 15s Baseline: 23s  Goal status: INITIAL    PLAN:  PT FREQUENCY: 1-2x/week  PT DURATION: 6 weeks  PLANNED INTERVENTIONS: Therapeutic exercises, Therapeutic activity, Neuromuscular  re-education, Balance training, Gait training, Patient/Family education, Self Care, Joint mobilization, Aquatic Therapy, Dry Needling, Electrical stimulation, Spinal mobilization, Cryotherapy, Moist heat, Manual therapy, and Re-evaluation.  PLAN FOR NEXT SESSION: HEP review and update, manual techniques as appropriate, aerobic tasks, ROM and flexibility activities, strengthening and PREs, TPDN, gait and balance training as needed     Hildred Laser, PT 07/19/2023, 1:40 PM   Check all possible CPT codes: 40981 - PT Re-evaluation, 97110- Therapeutic Exercise, [phone]- Neuro Re-education, [phone] - Gait Training, [phone] - Manual Therapy, [phone] - Therapeutic Activities, and [phone] - Self Care    Check all conditions that are expected to impact treatment: {Conditions expected to impact treatment:Complications related to surgery   If treatment provided at initial evaluation, no treatment charged due to lack of authorization.

## 2023-07-20 ENCOUNTER — Ambulatory Visit: Payer: Medicaid Other | Attending: Neurosurgery

## 2023-07-20 DIAGNOSIS — M7918 Myalgia, other site: Secondary | ICD-10-CM | POA: Diagnosis present

## 2023-07-20 DIAGNOSIS — M6281 Muscle weakness (generalized): Secondary | ICD-10-CM | POA: Diagnosis present

## 2023-07-20 DIAGNOSIS — M5459 Other low back pain: Secondary | ICD-10-CM | POA: Diagnosis present

## 2023-07-21 NOTE — Therapy (Unsigned)
OUTPATIENT PHYSICAL THERAPY THORACOLUMBAR EVALUATION   Patient Name: [name] MRN: [id number] DOB:Apr 27, 1991, 32 y.o., female Today's Date: 07/22/2023  END OF SESSION:  PT End of Session - 07/22/23 1135     Visit Number 3    Number of Visits 12    Date for PT Re-Evaluation 09/06/23    Authorization Type MCD    PT Start Time 1130    PT Stop Time 1210    PT Time Calculation (min) 40 min    Activity Tolerance Patient limited by pain;Patient tolerated treatment well    Behavior During Therapy [facility] for tasks assessed/performed;Anxious               Past Medical History:  Diagnosis Date   Abscess    Anginal pain (HCC) 12/15/2022   normal results w/ testing. Pt states she was told it was "probably a strained muscle" that was causing chest pain   Anxiety    Bipolar disorder (HCC)    Depression    Genital herpes    Migraines    "from time to time" per pt   Past Surgical History:  Procedure Laterality Date   CERVICAL DISC SURGERY     fusion   LUMBAR LAMINECTOMY/DECOMPRESSION MICRODISCECTOMY N/A 04/10/2022   Procedure: Lumbar three--four, Lumbar four-five Microdisscectomy;  Surgeon: Coletta Memos, MD;  Location: [facility] OR;  Service: Neurosurgery;  Laterality: N/A;  RM 21 3C   LUMBAR LAMINECTOMY/DECOMPRESSION MICRODISCECTOMY Right 02/04/2023   Procedure: Right Lumbar five-Sacral one Microdiscectomy;  Surgeon: Coletta Memos, MD;  Location: [facility] OR;  Service: Neurosurgery;  Laterality: Right;   MOUTH SURGERY     multiple extractions   Patient Active Problem List   Diagnosis Date Noted   HNP (herniated nucleus pulposus), lumbar 04/10/2022   SOB (shortness of breath) 05/30/2014   Other chest pain 05/30/2014   Rapid heart beat 05/30/2014   Panic attacks 05/30/2014   Anxiety 05/30/2014    [facility]P: [facility]   REFERRING PROVIDER: Coletta Memos, MD  REFERRING DIAG: M51.26 (ICD-10-CM) - Other intervertebral disc displacement, lumbar  region  Rationale for Evaluation and Treatment: Rehabilitation  THERAPY DIAG:  Other low back pain  Muscle weakness (generalized)  Piriformis muscle pain  ONSET DATE: 01/2023  SUBJECTIVE:                                                                                                                                                                                           SUBJECTIVE STATEMENT: Awoke with increased symptoms focused around R gluteal region.  Did well after first f/usession, no aggravation of condition.  PERTINENT HISTORY:  Pt is  a 32 y.o. female who presented 02/04/23 for same day right L5-S1 microdiscectomy. PMH: abscess, anxiety, bipolar disorder, depression, genital herpes, migraines   PAIN:  Are you having pain? Yes: NPRS scale: 10/10 Pain location: low back Pain description: ache Aggravating factors: bending and lifting tasks Relieving factors: rest   PRECAUTIONS: Back  RED FLAGS: None   WEIGHT BEARING RESTRICTIONS: No  FALLS:  Has patient fallen in last 6 months? No  OCCUPATION: not working  PLOF: Independent  PATIENT GOALS: To manage my back symptoms  NEXT MD VISIT: November 2024  OBJECTIVE:   DIAGNOSTIC FINDINGS:  LUMBAR SPINE - 1 VIEW   COMPARISON:  04/10/2022   FINDINGS: Cross-table lateral image of the lumbar spine was obtained during an operative procedure for localization purposes. Based on previous numbering convention, there is lumbarization of the S1 vertebral body, with a rudimentary disc at the S1/S2 level. Surgical instrument is seen overlying the L5-S1 inter spinous region.   IMPRESSION: 1. Surgical instrumentation overlying the L5-S1 interspinous region.     Electronically Signed   By: Sharlet Salina M.D.   On: 02/04/2023 15:26  PATIENT SURVEYS:  FOTO 38(47 predicted)  SCREENING FOR RED FLAGS: Bowel or bladder incontinence: No  MUSCLE LENGTH: Hamstrings: Right 70 deg; Left 90 deg Thomas test:  unremarkable  POSTURE: No Significant postural limitations  PALPATION: TTP R piriformis  LUMBAR ROM: deferred  AROM eval  Flexion   Extension   Right lateral flexion   Left lateral flexion   Right rotation   Left rotation    (Blank rows = not tested)  LOWER EXTREMITY ROM:   WFL B  Passive  Right eval Left eval  Hip flexion    Hip extension    Hip abduction    Hip adduction    Hip internal rotation    Hip external rotation    Knee flexion    Knee extension    Ankle dorsiflexion    Ankle plantarflexion    Ankle inversion    Ankle eversion     (Blank rows = not tested)  LOWER EXTREMITY MMT:    MMT Right eval Left eval  Hip flexion 4- 4  Hip extension 4- 4  Hip abduction 4- 4  Hip adduction    Hip internal rotation    Hip external rotation    Knee flexion 4- 4  Knee extension 4- 4  Ankle dorsiflexion    Ankle plantarflexion 4- 4  Ankle inversion    Ankle eversion     (Blank rows = not tested)  LUMBAR SPECIAL TESTS:  Straight leg raise test: Negative, Slump test: Negative, and piriformis  FUNCTIONAL TESTS:  5 times sit to stand: 23s  GAIT: Distance walked: 40ftx2 Assistive device utilized: None Level of assistance: Complete Independence Comments: slight antalgia  TODAY'S TREATMENT:      [facility] Adult PT Treatment:                                                DATE: 07/22/23 Therapeutic Exercise: Nustep L2 6 min (R piriformis irritation limiting time) Seated hamstring stretch 30s x2 B QL stretch 30s x2 Prone on elbows 2 min Prone press 10x PPT 3s 10x PPT with alt march 10/10 R piriformis stretch 30s x2 Clams B 15/15  Manual Therapy: 2 min R piriformis release    [facility] Adult PT Treatment:  DATE: 07/20/23 Therapeutic Exercise: Nustep L2 6 min (R piriformis irritation limiting time) Seated hamstring stretch 30s x2 B QL stretch 30s x2 Prone on elbows 2 min PPT 3s 10x PPT with alt march 10/10 R  piriformis stretch 30s x2 Clams B 15/15 Supine Hip fallouts RTB 15x Manual Therapy: R piriformis release 2 min                                                                                                                     DATE: 07/07/23 Eval    PATIENT EDUCATION:  Education details: Discussed eval findings, rehab rationale and POC and patient is in agreement  Person educated: Patient Education method: Explanation Education comprehension: verbalized understanding and needs further education  HOME EXERCISE PROGRAM: Access Code: [email or web address] URL: [email or web address] Date: 07/07/2023 Prepared by: Gustavus Bryant  Exercises - Static Prone on Elbows  - 2 x daily - 5 x weekly - 1 sets - 1 reps - 120s hold - Supine Piriformis Stretch with Foot on Ground  - 2 x daily - 5 x weekly - 1 sets - 2 reps - 30s hold - Sit to Stand with Arms Crossed  - 2 x daily - 5 x weekly - 1 sets - 5 reps  ASSESSMENT:  CLINICAL IMPRESSION: Continued stretch based program due to symptom increase.  More tolerant to piriformis release.  Added prone press up with more extension noted at end of 10 reps.  Patient is a 32 y.o. female who was seen today for physical therapy evaluation and treatment for low back pain. Patient presents with strength deficits in RLE due to disuse atrophy.  Marked soft tissue irritation noted in R piriformis.  Increased 5x STS time confirms soft tissue irritation contributing to symptoms.  Core and trunk strength deficits identified as well.  No neuro tension signs elicited but R hamstring tightness noted.  OBJECTIVE IMPAIRMENTS: Abnormal gait, decreased activity tolerance, decreased endurance, decreased knowledge of condition, decreased mobility, decreased ROM, decreased strength, impaired flexibility, improper body mechanics, postural dysfunction, and pain.   ACTIVITY LIMITATIONS: carrying, lifting, bending, and standing  PARTICIPATION LIMITATIONS: occupation  PERSONAL  FACTORS: Age, Past/current experiences, and Time since onset of injury/illness/exacerbation are also affecting patient's functional outcome.   REHAB POTENTIAL: Good  CLINICAL DECISION MAKING: Stable/uncomplicated  EVALUATION COMPLEXITY: Low   GOALS: Goals reviewed with patient? No  SHORT TERM GOALS: Target date: 07/28/2023  Patient to demonstrate independence in HEP  Baseline: [id number] Goal status: INITIAL  2.  Decrease R piriformis irritation from strong to moderate Baseline: Moderate tenderness Goal status: INITIAL  3.  Improve R hamstring flexibility to 90d Baseline: 70d Goal status: INITIAL   LONG TERM GOALS: Target date: 08/18/2023    Patient will score at least 47% on FOTO to signify clinically meaningful improvement in functional abilities.   Baseline: 38 Goal status: INITIAL  2.  4/10 pain on average Baseline: 10/10 at worst Goal status: INITIAL  3.  Increase RLE strength to 4/5 Baseline:  MMT Right eval  Left eval  Hip flexion 4- 4  Hip extension 4- 4  Hip abduction 4- 4  Hip adduction    Hip internal rotation    Hip external rotation    Knee flexion 4- 4  Knee extension 4- 4  Ankle dorsiflexion    Ankle plantarflexion 4- 4   Goal status: INITIAL  4.  Decrease 5x STS time to 15s Baseline: 23s  Goal status: INITIAL    PLAN:  PT FREQUENCY: 1-2x/week  PT DURATION: 6 weeks  PLANNED INTERVENTIONS: Therapeutic exercises, Therapeutic activity, Neuromuscular re-education, Balance training, Gait training, Patient/Family education, Self Care, Joint mobilization, Aquatic Therapy, Dry Needling, Electrical stimulation, Spinal mobilization, Cryotherapy, Moist heat, Manual therapy, and Re-evaluation.  PLAN FOR NEXT SESSION: HEP review and update, manual techniques as appropriate, aerobic tasks, ROM and flexibility activities, strengthening and PREs, TPDN, gait and balance training as needed     Hildred Laser, PT 07/22/2023, 12:12 PM   Check all  possible CPT codes: 16109 - PT Re-evaluation, 97110- Therapeutic Exercise, [phone]- Neuro Re-education, [phone] - Gait Training, [phone] - Manual Therapy, [phone] - Therapeutic Activities, and [phone] - Self Care    Check all conditions that are expected to impact treatment: {Conditions expected to impact treatment:Complications related to surgery   If treatment provided at initial evaluation, no treatment charged due to lack of authorization.

## 2023-07-22 ENCOUNTER — Ambulatory Visit: Payer: Medicaid Other

## 2023-07-22 DIAGNOSIS — M5459 Other low back pain: Secondary | ICD-10-CM | POA: Diagnosis not present

## 2023-07-22 DIAGNOSIS — M7918 Myalgia, other site: Secondary | ICD-10-CM

## 2023-07-22 DIAGNOSIS — M6281 Muscle weakness (generalized): Secondary | ICD-10-CM

## 2023-07-26 NOTE — Therapy (Deleted)
OUTPATIENT PHYSICAL THERAPY THORACOLUMBAR EVALUATION   Patient Name: [name] MRN: [id number] DOB:1991-04-20, 32 y.o., female Today's Date: 07/26/2023  END OF SESSION:      Past Medical History:  Diagnosis Date   Abscess    Anginal pain (HCC) 12/15/2022   normal results w/ testing. Pt states she was told it was "probably a strained muscle" that was causing chest pain   Anxiety    Bipolar disorder (HCC)    Depression    Genital herpes    Migraines    "from time to time" per pt   Past Surgical History:  Procedure Laterality Date   CERVICAL DISC SURGERY     fusion   LUMBAR LAMINECTOMY/DECOMPRESSION MICRODISCECTOMY N/A 04/10/2022   Procedure: Lumbar three--four, Lumbar four-five Microdisscectomy;  Surgeon: Coletta Memos, MD;  Location: [facility] OR;  Service: Neurosurgery;  Laterality: N/A;  RM 21 3C   LUMBAR LAMINECTOMY/DECOMPRESSION MICRODISCECTOMY Right 02/04/2023   Procedure: Right Lumbar five-Sacral one Microdiscectomy;  Surgeon: Coletta Memos, MD;  Location: [facility] OR;  Service: Neurosurgery;  Laterality: Right;   MOUTH SURGERY     multiple extractions   Patient Active Problem List   Diagnosis Date Noted   HNP (herniated nucleus pulposus), lumbar 04/10/2022   SOB (shortness of breath) 05/30/2014   Other chest pain 05/30/2014   Rapid heart beat 05/30/2014   Panic attacks 05/30/2014   Anxiety 05/30/2014    [facility]P: [facility]   REFERRING PROVIDER: Coletta Memos, MD  REFERRING DIAG: M51.26 (ICD-10-CM) - Other intervertebral disc displacement, lumbar region  Rationale for Evaluation and Treatment: Rehabilitation  THERAPY DIAG:  No diagnosis found.  ONSET DATE: 01/2023  SUBJECTIVE:                                                                                                                                                                                           SUBJECTIVE STATEMENT: Awoke with increased symptoms focused around R gluteal  region.  Did well after first f/usession, no aggravation of condition.  PERTINENT HISTORY:  Pt is a 32 y.o. female who presented 02/04/23 for same day right L5-S1 microdiscectomy. PMH: abscess, anxiety, bipolar disorder, depression, genital herpes, migraines   PAIN:  Are you having pain? Yes: NPRS scale: 10/10 Pain location: low back Pain description: ache Aggravating factors: bending and lifting tasks Relieving factors: rest   PRECAUTIONS: Back  RED FLAGS: None   WEIGHT BEARING RESTRICTIONS: No  FALLS:  Has patient fallen in last 6 months? No  OCCUPATION: not working  PLOF: Independent  PATIENT GOALS: To manage my back symptoms  NEXT MD VISIT: November 2024  OBJECTIVE:  DIAGNOSTIC FINDINGS:  LUMBAR SPINE - 1 VIEW   COMPARISON:  04/10/2022   FINDINGS: Cross-table lateral image of the lumbar spine was obtained during an operative procedure for localization purposes. Based on previous numbering convention, there is lumbarization of the S1 vertebral body, with a rudimentary disc at the S1/S2 level. Surgical instrument is seen overlying the L5-S1 inter spinous region.   IMPRESSION: 1. Surgical instrumentation overlying the L5-S1 interspinous region.     Electronically Signed   By: Sharlet Salina M.D.   On: 02/04/2023 15:26  PATIENT SURVEYS:  FOTO 38(47 predicted)  SCREENING FOR RED FLAGS: Bowel or bladder incontinence: No  MUSCLE LENGTH: Hamstrings: Right 70 deg; Left 90 deg Thomas test: unremarkable  POSTURE: No Significant postural limitations  PALPATION: TTP R piriformis  LUMBAR ROM: deferred  AROM eval  Flexion   Extension   Right lateral flexion   Left lateral flexion   Right rotation   Left rotation    (Blank rows = not tested)  LOWER EXTREMITY ROM:   WFL B  Passive  Right eval Left eval  Hip flexion    Hip extension    Hip abduction    Hip adduction    Hip internal rotation    Hip external rotation    Knee flexion    Knee  extension    Ankle dorsiflexion    Ankle plantarflexion    Ankle inversion    Ankle eversion     (Blank rows = not tested)  LOWER EXTREMITY MMT:    MMT Right eval Left eval  Hip flexion 4- 4  Hip extension 4- 4  Hip abduction 4- 4  Hip adduction    Hip internal rotation    Hip external rotation    Knee flexion 4- 4  Knee extension 4- 4  Ankle dorsiflexion    Ankle plantarflexion 4- 4  Ankle inversion    Ankle eversion     (Blank rows = not tested)  LUMBAR SPECIAL TESTS:  Straight leg raise test: Negative, Slump test: Negative, and piriformis  FUNCTIONAL TESTS:  5 times sit to stand: 23s  GAIT: Distance walked: 77ftx2 Assistive device utilized: None Level of assistance: Complete Independence Comments: slight antalgia  TODAY'S TREATMENT:      [facility] Adult PT Treatment:                                                DATE: 07/22/23 Therapeutic Exercise: Nustep L2 6 min (R piriformis irritation limiting time) Seated hamstring stretch 30s x2 B QL stretch 30s x2 Prone on elbows 2 min Prone press 10x PPT 3s 10x PPT with alt march 10/10 R piriformis stretch 30s x2 Clams B 15/15  Manual Therapy: 2 min R piriformis release    [facility] Adult PT Treatment:                                                DATE: 07/20/23 Therapeutic Exercise: Nustep L2 6 min (R piriformis irritation limiting time) Seated hamstring stretch 30s x2 B QL stretch 30s x2 Prone on elbows 2 min PPT 3s 10x PPT with alt march 10/10 R piriformis stretch 30s x2 Clams B 15/15 Supine Hip fallouts RTB 15x Manual Therapy: R piriformis  release 2 min                                                                                                                     DATE: 07/07/23 Eval    PATIENT EDUCATION:  Education details: Discussed eval findings, rehab rationale and POC and patient is in agreement  Person educated: Patient Education method: Explanation Education comprehension: verbalized  understanding and needs further education  HOME EXERCISE PROGRAM: Access Code: [email or web address] URL: [email or web address] Date: 07/07/2023 Prepared by: Gustavus Bryant  Exercises - Static Prone on Elbows  - 2 x daily - 5 x weekly - 1 sets - 1 reps - 120s hold - Supine Piriformis Stretch with Foot on Ground  - 2 x daily - 5 x weekly - 1 sets - 2 reps - 30s hold - Sit to Stand with Arms Crossed  - 2 x daily - 5 x weekly - 1 sets - 5 reps  ASSESSMENT:  CLINICAL IMPRESSION: Continued stretch based program due to symptom increase.  More tolerant to piriformis release.  Added prone press up with more extension noted at end of 10 reps.  Patient is a 32 y.o. female who was seen today for physical therapy evaluation and treatment for low back pain. Patient presents with strength deficits in RLE due to disuse atrophy.  Marked soft tissue irritation noted in R piriformis.  Increased 5x STS time confirms soft tissue irritation contributing to symptoms.  Core and trunk strength deficits identified as well.  No neuro tension signs elicited but R hamstring tightness noted.  OBJECTIVE IMPAIRMENTS: Abnormal gait, decreased activity tolerance, decreased endurance, decreased knowledge of condition, decreased mobility, decreased ROM, decreased strength, impaired flexibility, improper body mechanics, postural dysfunction, and pain.   ACTIVITY LIMITATIONS: carrying, lifting, bending, and standing  PARTICIPATION LIMITATIONS: occupation  PERSONAL FACTORS: Age, Past/current experiences, and Time since onset of injury/illness/exacerbation are also affecting patient's functional outcome.   REHAB POTENTIAL: Good  CLINICAL DECISION MAKING: Stable/uncomplicated  EVALUATION COMPLEXITY: Low   GOALS: Goals reviewed with patient? No  SHORT TERM GOALS: Target date: 07/28/2023  Patient to demonstrate independence in HEP  Baseline: [id number] Goal status: INITIAL  2.  Decrease R piriformis irritation from  strong to moderate Baseline: Moderate tenderness Goal status: INITIAL  3.  Improve R hamstring flexibility to 90d Baseline: 70d Goal status: INITIAL   LONG TERM GOALS: Target date: 08/18/2023    Patient will score at least 47% on FOTO to signify clinically meaningful improvement in functional abilities.   Baseline: 38 Goal status: INITIAL  2.  4/10 pain on average Baseline: 10/10 at worst Goal status: INITIAL  3.  Increase RLE strength to 4/5 Baseline:  MMT Right eval Left eval  Hip flexion 4- 4  Hip extension 4- 4  Hip abduction 4- 4  Hip adduction    Hip internal rotation    Hip external rotation    Knee flexion 4- 4  Knee extension 4- 4  Ankle dorsiflexion    Ankle plantarflexion 4-  4   Goal status: INITIAL  4.  Decrease 5x STS time to 15s Baseline: 23s  Goal status: INITIAL    PLAN:  PT FREQUENCY: 1-2x/week  PT DURATION: 6 weeks  PLANNED INTERVENTIONS: Therapeutic exercises, Therapeutic activity, Neuromuscular re-education, Balance training, Gait training, Patient/Family education, Ward Care, Joint mobilization, Aquatic Therapy, Dry Needling, Electrical stimulation, Spinal mobilization, Cryotherapy, Moist heat, Manual therapy, and Re-evaluation.  PLAN FOR NEXT SESSION: HEP review and update, manual techniques as appropriate, aerobic tasks, ROM and flexibility activities, strengthening and PREs, TPDN, gait and balance training as needed     Hildred Laser, PT 07/26/2023, 11:03 AM   Check all possible CPT codes: 28413 - PT Re-evaluation, 97110- Therapeutic Exercise, [phone]- Neuro Re-education, [phone] - Gait Training, [phone] - Manual Therapy, [phone] - Therapeutic Activities, and [phone] - Ward Care    Check all conditions that are expected to impact treatment: {Conditions expected to impact treatment:Complications related to surgery   If treatment provided at initial evaluation, no treatment charged due to lack of authorization.

## 2023-07-27 ENCOUNTER — Ambulatory Visit: Payer: Medicaid Other

## 2023-07-27 NOTE — Therapy (Deleted)
OUTPATIENT PHYSICAL THERAPY THORACOLUMBAR EVALUATION   Patient Name: [name] MRN: [id number] DOB:[birth year], 32 y.o., female Today's Date: 07/27/2023  END OF SESSION:      Past Medical History:  Diagnosis Date   Abscess    Anginal pain (HCC) 12/15/2022   normal results w/ testing. Pt states she was told it was "probably a strained muscle" that was causing chest pain   Anxiety    Bipolar disorder (HCC)    Depression    Genital herpes    Migraines    "from time to time" per pt   Past Surgical History:  Procedure Laterality Date   CERVICAL DISC SURGERY     fusion   LUMBAR LAMINECTOMY/DECOMPRESSION MICRODISCECTOMY N/A 04/10/2022   Procedure: Lumbar three--four, Lumbar four-five Microdisscectomy;  Surgeon: Coletta Memos, MD;  Location: [facility] OR;  Service: Neurosurgery;  Laterality: N/A;  RM 21 3C   LUMBAR LAMINECTOMY/DECOMPRESSION MICRODISCECTOMY Right 02/04/2023   Procedure: Right Lumbar five-Sacral one Microdiscectomy;  Surgeon: Coletta Memos, MD;  Location: [facility] OR;  Service: Neurosurgery;  Laterality: Right;   MOUTH SURGERY     multiple extractions   Patient Active Problem List   Diagnosis Date Noted   HNP (herniated nucleus pulposus), lumbar 04/10/2022   SOB (shortness of breath) 05/30/2014   Other chest pain 05/30/2014   Rapid heart beat 05/30/2014   Panic attacks 05/30/2014   Anxiety 05/30/2014    [facility]P: [facility]   REFERRING PROVIDER: Coletta Memos, MD  REFERRING DIAG: M51.26 (ICD-10-CM) - Other intervertebral disc displacement, lumbar region  Rationale for Evaluation and Treatment: Rehabilitation  THERAPY DIAG:  No diagnosis found.  ONSET DATE: 01/2023  SUBJECTIVE:                                                                                                                                                                                           SUBJECTIVE STATEMENT: Awoke with increased symptoms focused around R gluteal  region.  Did well after first f/usession, no aggravation of condition.  PERTINENT HISTORY:  Pt is a 32 y.o. female who presented 02/04/23 for same day right L5-S1 microdiscectomy. PMH: abscess, anxiety, bipolar disorder, depression, genital herpes, migraines   PAIN:  Are you having pain? Yes: NPRS scale: 10/10 Pain location: low back Pain description: ache Aggravating factors: bending and lifting tasks Relieving factors: rest   PRECAUTIONS: Back  RED FLAGS: None   WEIGHT BEARING RESTRICTIONS: No  FALLS:  Has patient fallen in last 6 months? No  OCCUPATION: not working  PLOF: Independent  PATIENT GOALS: To manage my back symptoms  NEXT MD VISIT: November 2024  OBJECTIVE:  DIAGNOSTIC FINDINGS:  LUMBAR SPINE - 1 VIEW   COMPARISON:  04/10/2022   FINDINGS: Cross-table lateral image of the lumbar spine was obtained during an operative procedure for localization purposes. Based on previous numbering convention, there is lumbarization of the S1 vertebral body, with a rudimentary disc at the S1/S2 level. Surgical instrument is seen overlying the L5-S1 inter spinous region.   IMPRESSION: 1. Surgical instrumentation overlying the L5-S1 interspinous region.     Electronically Signed   By: Sharlet Salina M.D.   On: 02/04/2023 15:26  PATIENT SURVEYS:  FOTO 38(47 predicted)  SCREENING FOR RED FLAGS: Bowel or bladder incontinence: No  MUSCLE LENGTH: Hamstrings: Right 70 deg; Left 90 deg Thomas test: unremarkable  POSTURE: No Significant postural limitations  PALPATION: TTP R piriformis  LUMBAR ROM: deferred  AROM eval  Flexion   Extension   Right lateral flexion   Left lateral flexion   Right rotation   Left rotation    (Blank rows = not tested)  LOWER EXTREMITY ROM:   WFL B  Passive  Right eval Left eval  Hip flexion    Hip extension    Hip abduction    Hip adduction    Hip internal rotation    Hip external rotation    Knee flexion    Knee  extension    Ankle dorsiflexion    Ankle plantarflexion    Ankle inversion    Ankle eversion     (Blank rows = not tested)  LOWER EXTREMITY MMT:    MMT Right eval Left eval  Hip flexion 4- 4  Hip extension 4- 4  Hip abduction 4- 4  Hip adduction    Hip internal rotation    Hip external rotation    Knee flexion 4- 4  Knee extension 4- 4  Ankle dorsiflexion    Ankle plantarflexion 4- 4  Ankle inversion    Ankle eversion     (Blank rows = not tested)  LUMBAR SPECIAL TESTS:  Straight leg raise test: Negative, Slump test: Negative, and piriformis  FUNCTIONAL TESTS:  5 times sit to stand: 23s  GAIT: Distance walked: 71ftx2 Assistive device utilized: None Level of assistance: Complete Independence Comments: slight antalgia  TODAY'S TREATMENT:      [facility] Adult PT Treatment:                                                DATE: 07/22/23 Therapeutic Exercise: Nustep L2 6 min (R piriformis irritation limiting time) Seated hamstring stretch 30s x2 B QL stretch 30s x2 Prone on elbows 2 min Prone press 10x PPT 3s 10x PPT with alt march 10/10 R piriformis stretch 30s x2 Clams B 15/15  Manual Therapy: 2 min R piriformis release    [facility] Adult PT Treatment:                                                DATE: 07/20/23 Therapeutic Exercise: Nustep L2 6 min (R piriformis irritation limiting time) Seated hamstring stretch 30s x2 B QL stretch 30s x2 Prone on elbows 2 min PPT 3s 10x PPT with alt march 10/10 R piriformis stretch 30s x2 Clams B 15/15 Supine Hip fallouts RTB 15x Manual Therapy: R piriformis  release 2 min                                                                                                                     DATE: 07/07/23 Eval    PATIENT EDUCATION:  Education details: Discussed eval findings, rehab rationale and POC and patient is in agreement  Person educated: Patient Education method: Explanation Education comprehension: verbalized  understanding and needs further education  HOME EXERCISE PROGRAM: Access Code: [email or web address] URL: [email or web address] Date: 07/07/2023 Prepared by: Gustavus Bryant  Exercises - Static Prone on Elbows  - 2 x daily - 5 x weekly - 1 sets - 1 reps - 120s hold - Supine Piriformis Stretch with Foot on Ground  - 2 x daily - 5 x weekly - 1 sets - 2 reps - 30s hold - Sit to Stand with Arms Crossed  - 2 x daily - 5 x weekly - 1 sets - 5 reps  ASSESSMENT:  CLINICAL IMPRESSION: Continued stretch based program due to symptom increase.  More tolerant to piriformis release.  Added prone press up with more extension noted at end of 10 reps.  Patient is a 32 y.o. female who was seen today for physical therapy evaluation and treatment for low back pain. Patient presents with strength deficits in RLE due to disuse atrophy.  Marked soft tissue irritation noted in R piriformis.  Increased 5x STS time confirms soft tissue irritation contributing to symptoms.  Core and trunk strength deficits identified as well.  No neuro tension signs elicited but R hamstring tightness noted.  OBJECTIVE IMPAIRMENTS: Abnormal gait, decreased activity tolerance, decreased endurance, decreased knowledge of condition, decreased mobility, decreased ROM, decreased strength, impaired flexibility, improper body mechanics, postural dysfunction, and pain.   ACTIVITY LIMITATIONS: carrying, lifting, bending, and standing  PARTICIPATION LIMITATIONS: occupation  PERSONAL FACTORS: Age, Past/current experiences, and Time since onset of injury/illness/exacerbation are also affecting patient's functional outcome.   REHAB POTENTIAL: Good  CLINICAL DECISION MAKING: Stable/uncomplicated  EVALUATION COMPLEXITY: Low   GOALS: Goals reviewed with patient? No  SHORT TERM GOALS: Target date: 07/28/2023  Patient to demonstrate independence in HEP  Baseline: [email or web address] Goal status: INITIAL  2.  Decrease R piriformis irritation from  strong to moderate Baseline: Moderate tenderness Goal status: INITIAL  3.  Improve R hamstring flexibility to 90d Baseline: 70d Goal status: INITIAL   LONG TERM GOALS: Target date: 08/18/2023    Patient will score at least 47% on FOTO to signify clinically meaningful improvement in functional abilities.   Baseline: 38 Goal status: INITIAL  2.  4/10 pain on average Baseline: 10/10 at worst Goal status: INITIAL  3.  Increase RLE strength to 4/5 Baseline:  MMT Right eval Left eval  Hip flexion 4- 4  Hip extension 4- 4  Hip abduction 4- 4  Hip adduction    Hip internal rotation    Hip external rotation    Knee flexion 4- 4  Knee extension 4- 4  Ankle dorsiflexion    Ankle plantarflexion 4-  4   Goal status: INITIAL  4.  Decrease 5x STS time to 15s Baseline: 23s  Goal status: INITIAL    PLAN:  PT FREQUENCY: 1-2x/week  PT DURATION: 6 weeks  PLANNED INTERVENTIONS: Therapeutic exercises, Therapeutic activity, Neuromuscular re-education, Balance training, Gait training, Patient/Family education, Self Care, Joint mobilization, Aquatic Therapy, Dry Needling, Electrical stimulation, Spinal mobilization, Cryotherapy, Moist heat, Manual therapy, and Re-evaluation.  PLAN FOR NEXT SESSION: HEP review and update, manual techniques as appropriate, aerobic tasks, ROM and flexibility activities, strengthening and PREs, TPDN, gait and balance training as needed     Hildred Laser, PT 07/27/2023, 12:40 PM   Check all possible CPT codes: 16109 - PT Re-evaluation, 97110- Therapeutic Exercise, [phone]- Neuro Re-education, [phone] - Gait Training, [phone] - Manual Therapy, [phone] - Therapeutic Activities, and [phone] - Self Care    Check all conditions that are expected to impact treatment: {Conditions expected to impact treatment:Complications related to surgery   If treatment provided at initial evaluation, no treatment charged due to lack of authorization.

## 2023-07-29 ENCOUNTER — Ambulatory Visit: Payer: Medicaid Other

## 2023-08-02 NOTE — Therapy (Unsigned)
OUTPATIENT PHYSICAL THERAPY TREATMENT NOTE   Patient Name: [name] MRN: [id number] DOB:04-29-1991, 32 y.o., female Today's Date: 08/03/2023  END OF SESSION:  PT End of Session - 08/03/23 1133     Visit Number 4    Number of Visits 12    Date for PT Re-Evaluation 09/06/23    Authorization Type MCD    PT Start Time 1130    PT Stop Time 1215    PT Time Calculation (min) 45 min    Activity Tolerance Patient limited by pain;Patient tolerated treatment well    Behavior During Therapy [facility] for tasks assessed/performed;Anxious                Past Medical History:  Diagnosis Date   Abscess    Anginal pain (HCC) 12/15/2022   normal results w/ testing. Pt states she was told it was "probably a strained muscle" that was causing chest pain   Anxiety    Bipolar disorder (HCC)    Depression    Genital herpes    Migraines    "from time to time" per pt   Past Surgical History:  Procedure Laterality Date   CERVICAL DISC SURGERY     fusion   LUMBAR LAMINECTOMY/DECOMPRESSION MICRODISCECTOMY N/A 04/10/2022   Procedure: Lumbar three--four, Lumbar four-five Microdisscectomy;  Surgeon: Coletta Memos, MD;  Location: [facility] OR;  Service: Neurosurgery;  Laterality: N/A;  RM 21 3C   LUMBAR LAMINECTOMY/DECOMPRESSION MICRODISCECTOMY Right 02/04/2023   Procedure: Right Lumbar five-Sacral one Microdiscectomy;  Surgeon: Coletta Memos, MD;  Location: [facility] OR;  Service: Neurosurgery;  Laterality: Right;   MOUTH SURGERY     multiple extractions   Patient Active Problem List   Diagnosis Date Noted   HNP (herniated nucleus pulposus), lumbar 04/10/2022   SOB (shortness of breath) 05/30/2014   Other chest pain 05/30/2014   Rapid heart beat 05/30/2014   Panic attacks 05/30/2014   Anxiety 05/30/2014    [facility]P: [facility]   REFERRING PROVIDER: Coletta Memos, MD  REFERRING DIAG: M51.26 (ICD-10-CM) - Other intervertebral disc displacement, lumbar region  Rationale for  Evaluation and Treatment: Rehabilitation  THERAPY DIAG:  Other low back pain  Piriformis muscle pain  Muscle weakness (generalized)  ONSET DATE: 01/2023  SUBJECTIVE:                                                                                                                                                                                           SUBJECTIVE STATEMENT: Still notes R sided low back symptoms but tasks such as bending and squatting have become easier.  PERTINENT HISTORY:  Pt is a  32 y.o. female who presented 02/04/23 for same day right L5-S1 microdiscectomy. PMH: abscess, anxiety, bipolar disorder, depression, genital herpes, migraines   PAIN:  Are you having pain? Yes: NPRS scale: 10/10 Pain location: low back Pain description: ache Aggravating factors: bending and lifting tasks Relieving factors: rest   PRECAUTIONS: Back  RED FLAGS: None   WEIGHT BEARING RESTRICTIONS: No  FALLS:  Has patient fallen in last 6 months? No  OCCUPATION: not working  PLOF: Independent  PATIENT GOALS: To manage my back symptoms  NEXT MD VISIT: November 2024  OBJECTIVE:   DIAGNOSTIC FINDINGS:  LUMBAR SPINE - 1 VIEW   COMPARISON:  04/10/2022   FINDINGS: Cross-table lateral image of the lumbar spine was obtained during an operative procedure for localization purposes. Based on previous numbering convention, there is lumbarization of the S1 vertebral body, with a rudimentary disc at the S1/S2 level. Surgical instrument is seen overlying the L5-S1 inter spinous region.   IMPRESSION: 1. Surgical instrumentation overlying the L5-S1 interspinous region.     Electronically Signed   By: Sharlet Salina M.D.   On: 02/04/2023 15:26  PATIENT SURVEYS:  FOTO 38(47 predicted)  SCREENING FOR RED FLAGS: Bowel or bladder incontinence: No  MUSCLE LENGTH: Hamstrings: Right 70 deg; Left 90 deg Thomas test: unremarkable  POSTURE: No Significant postural  limitations  PALPATION: TTP R piriformis  LUMBAR ROM: deferred  AROM eval  Flexion   Extension   Right lateral flexion   Left lateral flexion   Right rotation   Left rotation    (Blank rows = not tested)  LOWER EXTREMITY ROM:   WFL B  Passive  Right eval Left eval  Hip flexion    Hip extension    Hip abduction    Hip adduction    Hip internal rotation    Hip external rotation    Knee flexion    Knee extension    Ankle dorsiflexion    Ankle plantarflexion    Ankle inversion    Ankle eversion     (Blank rows = not tested)  LOWER EXTREMITY MMT:    MMT Right eval Left eval  Hip flexion 4- 4  Hip extension 4- 4  Hip abduction 4- 4  Hip adduction    Hip internal rotation    Hip external rotation    Knee flexion 4- 4  Knee extension 4- 4  Ankle dorsiflexion    Ankle plantarflexion 4- 4  Ankle inversion    Ankle eversion     (Blank rows = not tested)  LUMBAR SPECIAL TESTS:  Straight leg raise test: Negative, Slump test: Negative, and piriformis  FUNCTIONAL TESTS:  5 times sit to stand: 23s  GAIT: Distance walked: 85ftx2 Assistive device utilized: None Level of assistance: Complete Independence Comments: slight antalgia  TODAY'S TREATMENT:      [facility] Adult PT Treatment:                                                DATE: 08/03/23 Therapeutic Exercise: Nustep L4 6 min  Seated hamstring stretch 30s x2 B QL stretch 30s x2 Prone on elbows 2 min R piriformis stretch 30s x2 (with towel) Supine hip fallouts GTB 10x B 10/10 unilateral Clams GTB B 10/10 Manual Therapy: 2 min R piriformis release  [facility] Adult PT Treatment:  DATE: 07/22/23 Therapeutic Exercise: Nustep L2 6 min (R piriformis irritation limiting time) Seated hamstring stretch 30s x2 B QL stretch 30s x2 Prone on elbows 2 min Prone press 10x PPT 3s 10x PPT with alt march 10/10 R piriformis stretch 30s x2 Clams B 15/15  Manual Therapy: 2  min R piriformis release    [facility] Adult PT Treatment:                                                DATE: 07/20/23 Therapeutic Exercise: Nustep L2 6 min (R piriformis irritation limiting time) Seated hamstring stretch 30s x2 B QL stretch 30s x2 Prone on elbows 2 min PPT 3s 10x PPT with alt march 10/10 R piriformis stretch 30s x2 Clams B 15/15 Supine Hip fallouts RTB 15x Manual Therapy: R piriformis release 2 min                                                                                                                     DATE: 07/07/23 Eval    PATIENT EDUCATION:  Education details: Discussed eval findings, rehab rationale and POC and patient is in agreement  Person educated: Patient Education method: Explanation Education comprehension: verbalized understanding and needs further education  HOME EXERCISE PROGRAM: Access Code: [email or web address] URL: [email or web address] Date: 07/07/2023 Prepared by: Gustavus Bryant  Exercises - Static Prone on Elbows  - 2 x daily - 5 x weekly - 1 sets - 1 reps - 120s hold - Supine Piriformis Stretch with Foot on Ground  - 2 x daily - 5 x weekly - 1 sets - 2 reps - 30s hold - Sit to Stand with Arms Crossed  - 2 x daily - 5 x weekly - 1 sets - 5 reps  ASSESSMENT:  CLINICAL IMPRESSION: Able to increase resistance and challenge as noted.  Discussed TPDN and patient will consider as she has a fear of needles.  Still reports some GI distress and will participate as tolerated.  Treatment options altered to accommodate GI distress bu limiting abdominal tasks.  Still only able to tolerate 2 min R piriformis release.  Patient is a 32 y.o. female who was seen today for physical therapy evaluation and treatment for low back pain. Patient presents with strength deficits in RLE due to disuse atrophy.  Marked soft tissue irritation noted in R piriformis.  Increased 5x STS time confirms soft tissue irritation contributing to symptoms.  Core and trunk  strength deficits identified as well.  No neuro tension signs elicited but R hamstring tightness noted.  OBJECTIVE IMPAIRMENTS: Abnormal gait, decreased activity tolerance, decreased endurance, decreased knowledge of condition, decreased mobility, decreased ROM, decreased strength, impaired flexibility, improper body mechanics, postural dysfunction, and pain.   ACTIVITY LIMITATIONS: carrying, lifting, bending, and standing  PARTICIPATION LIMITATIONS: occupation  PERSONAL FACTORS: Age, Past/current experiences, and Time since onset of injury/illness/exacerbation are also affecting  patient's functional outcome.   REHAB POTENTIAL: Good  CLINICAL DECISION MAKING: Stable/uncomplicated  EVALUATION COMPLEXITY: Low   GOALS: Goals reviewed with patient? No  SHORT TERM GOALS: Target date: 07/28/2023  Patient to demonstrate independence in HEP  Baseline: [id number] Goal status: Met  2.  Decrease R piriformis irritation from strong to moderate Baseline: Strong tenderness Goal status: INITIAL  3.  Improve R hamstring flexibility to 90d Baseline: 70d Goal status: INITIAL   LONG TERM GOALS: Target date: 08/18/2023    Patient will score at least 47% on FOTO to signify clinically meaningful improvement in functional abilities.   Baseline: 38 Goal status: INITIAL  2.  4/10 pain on average Baseline: 10/10 at worst Goal status: INITIAL  3.  Increase RLE strength to 4/5 Baseline:  MMT Right eval Left eval  Hip flexion 4- 4  Hip extension 4- 4  Hip abduction 4- 4  Hip adduction    Hip internal rotation    Hip external rotation    Knee flexion 4- 4  Knee extension 4- 4  Ankle dorsiflexion    Ankle plantarflexion 4- 4   Goal status: INITIAL  4.  Decrease 5x STS time to 15s Baseline: 23s  Goal status: INITIAL    PLAN:  PT FREQUENCY: 1-2x/week  PT DURATION: 6 weeks  PLANNED INTERVENTIONS: Therapeutic exercises, Therapeutic activity, Neuromuscular re-education, Balance  training, Gait training, Patient/Family education, Self Care, Joint mobilization, Aquatic Therapy, Dry Needling, Electrical stimulation, Spinal mobilization, Cryotherapy, Moist heat, Manual therapy, and Re-evaluation.  PLAN FOR NEXT SESSION: HEP review and update, manual techniques as appropriate, aerobic tasks, ROM and flexibility activities, strengthening and PREs, TPDN, gait and balance training as needed     Hildred Laser, PT 08/03/2023, 12:13 PM   Check all possible CPT codes: 16109 - PT Re-evaluation, 97110- Therapeutic Exercise, [phone]- Neuro Re-education, [phone] - Gait Training, [phone] - Manual Therapy, [phone] - Therapeutic Activities, and [phone] - Self Care    Check all conditions that are expected to impact treatment: {Conditions expected to impact treatment:Complications related to surgery   If treatment provided at initial evaluation, no treatment charged due to lack of authorization.

## 2023-08-03 ENCOUNTER — Ambulatory Visit: Payer: Medicaid Other

## 2023-08-03 DIAGNOSIS — M5459 Other low back pain: Secondary | ICD-10-CM

## 2023-08-03 DIAGNOSIS — M6281 Muscle weakness (generalized): Secondary | ICD-10-CM

## 2023-08-03 DIAGNOSIS — M7918 Myalgia, other site: Secondary | ICD-10-CM

## 2023-08-04 NOTE — Therapy (Unsigned)
OUTPATIENT PHYSICAL THERAPY TREATMENT NOTE   Patient Name: [name] MRN: [id number] DOB:Feb 24, 1991, 32 y.o., female Today's Date: 08/05/2023  END OF SESSION:  PT End of Session - 08/05/23 1129     Visit Number 5    Number of Visits 12    Date for PT Re-Evaluation 09/06/23    Authorization Type MCD    PT Start Time 1130    PT Stop Time 1215    PT Time Calculation (min) 45 min    Activity Tolerance Patient limited by pain;Patient tolerated treatment well    Behavior During Therapy [facility] for tasks assessed/performed;Anxious                 Past Medical History:  Diagnosis Date   Abscess    Anginal pain (HCC) 12/15/2022   normal results w/ testing. Pt states she was told it was "probably a strained muscle" that was causing chest pain   Anxiety    Bipolar disorder (HCC)    Depression    Genital herpes    Migraines    "from time to time" per pt   Past Surgical History:  Procedure Laterality Date   CERVICAL DISC SURGERY     fusion   LUMBAR LAMINECTOMY/DECOMPRESSION MICRODISCECTOMY N/A 04/10/2022   Procedure: Lumbar three--four, Lumbar four-five Microdisscectomy;  Surgeon: Coletta Memos, MD;  Location: [facility] OR;  Service: Neurosurgery;  Laterality: N/A;  RM 21 3C   LUMBAR LAMINECTOMY/DECOMPRESSION MICRODISCECTOMY Right 02/04/2023   Procedure: Right Lumbar five-Sacral one Microdiscectomy;  Surgeon: Coletta Memos, MD;  Location: [facility] OR;  Service: Neurosurgery;  Laterality: Right;   MOUTH SURGERY     multiple extractions   Patient Active Problem List   Diagnosis Date Noted   HNP (herniated nucleus pulposus), lumbar 04/10/2022   SOB (shortness of breath) 05/30/2014   Other chest pain 05/30/2014   Rapid heart beat 05/30/2014   Panic attacks 05/30/2014   Anxiety 05/30/2014    [facility]P: [facility]   REFERRING PROVIDER: Coletta Memos, MD  REFERRING DIAG: M51.26 (ICD-10-CM) - Other intervertebral disc displacement, lumbar region  Rationale  for Evaluation and Treatment: Rehabilitation  THERAPY DIAG:  Other low back pain  Piriformis muscle pain  Muscle weakness (generalized)  ONSET DATE: 01/2023  SUBJECTIVE:                                                                                                                                                                                           SUBJECTIVE STATEMENT: Continued centralized low back pain in lumbosacral region as well as R piriformis pain and irritation.  Declines TPDN due to anxiety issues  PERTINENT HISTORY:  Pt is a 32 y.o. female who presented 02/04/23 for same day right L5-S1 microdiscectomy. PMH: abscess, anxiety, bipolar disorder, depression, genital herpes, migraines   PAIN:  Are you having pain? Yes: NPRS scale: 10/10 Pain location: low back Pain description: ache Aggravating factors: bending and lifting tasks Relieving factors: rest   PRECAUTIONS: Back  RED FLAGS: None   WEIGHT BEARING RESTRICTIONS: No  FALLS:  Has patient fallen in last 6 months? No  OCCUPATION: not working  PLOF: Independent  PATIENT GOALS: To manage my back symptoms  NEXT MD VISIT: November 2024  OBJECTIVE:   DIAGNOSTIC FINDINGS:  LUMBAR SPINE - 1 VIEW   COMPARISON:  04/10/2022   FINDINGS: Cross-table lateral image of the lumbar spine was obtained during an operative procedure for localization purposes. Based on previous numbering convention, there is lumbarization of the S1 vertebral body, with a rudimentary disc at the S1/S2 level. Surgical instrument is seen overlying the L5-S1 inter spinous region.   IMPRESSION: 1. Surgical instrumentation overlying the L5-S1 interspinous region.     Electronically Signed   By: Sharlet Salina M.D.   On: 02/04/2023 15:26  PATIENT SURVEYS:  FOTO 38(47 predicted)  SCREENING FOR RED FLAGS: Bowel or bladder incontinence: No  MUSCLE LENGTH: Hamstrings: Right 70 deg; Left 90 deg Thomas test:  unremarkable  POSTURE: No Significant postural limitations  PALPATION: TTP R piriformis  LUMBAR ROM: deferred  AROM eval  Flexion   Extension   Right lateral flexion   Left lateral flexion   Right rotation   Left rotation    (Blank rows = not tested)  LOWER EXTREMITY ROM:   WFL B  Passive  Right eval Left eval  Hip flexion    Hip extension    Hip abduction    Hip adduction    Hip internal rotation    Hip external rotation    Knee flexion    Knee extension    Ankle dorsiflexion    Ankle plantarflexion    Ankle inversion    Ankle eversion     (Blank rows = not tested)  LOWER EXTREMITY MMT:    MMT Right eval Left eval  Hip flexion 4- 4  Hip extension 4- 4  Hip abduction 4- 4  Hip adduction    Hip internal rotation    Hip external rotation    Knee flexion 4- 4  Knee extension 4- 4  Ankle dorsiflexion    Ankle plantarflexion 4- 4  Ankle inversion    Ankle eversion     (Blank rows = not tested)  LUMBAR SPECIAL TESTS:  Straight leg raise test: Negative, Slump test: Negative, and piriformis  irritation present 09/05/23 + slump test on R  FUNCTIONAL TESTS:  5 times sit to stand: 23s  GAIT: Distance walked: 3ftx2 Assistive device utilized: None Level of assistance: Complete Independence Comments: slight antalgia  TODAY'S TREATMENT:      [facility] Adult PT Treatment:                                                DATE: 08/05/23 Therapeutic Exercise: Nustep L4 6 min  Seated sciatic floss 10x  Supine sciatic nerve floss 10x PPT 10x PPT with march 10/10 Curl ups with p-ball 10x B, 10/10 unilaterally R piriformis tennis ball release 2 min Seated R piriformis stretch 30s x2  [facility]  Adult PT Treatment:                                                DATE: 08/03/23 Therapeutic Exercise: Nustep L4 6 min  Seated hamstring stretch 30s x2 B QL stretch 30s x2 Prone on elbows 2 min R piriformis stretch 30s x2 (with towel) Supine hip fallouts GTB 10x B 10/10  unilateral Clams GTB B 10/10 Manual Therapy: 2 min R piriformis release  [facility] Adult PT Treatment:                                                DATE: 07/22/23 Therapeutic Exercise: Nustep L2 6 min (R piriformis irritation limiting time) Seated hamstring stretch 30s x2 B QL stretch 30s x2 Prone on elbows 2 min Prone press 10x PPT 3s 10x PPT with alt march 10/10 R piriformis stretch 30s x2 Clams B 15/15  Manual Therapy: 2 min R piriformis release    [facility] Adult PT Treatment:                                                DATE: 07/20/23 Therapeutic Exercise: Nustep L2 6 min (R piriformis irritation limiting time) Seated hamstring stretch 30s x2 B QL stretch 30s x2 Prone on elbows 2 min PPT 3s 10x PPT with alt march 10/10 R piriformis stretch 30s x2 Clams B 15/15 Supine Hip fallouts RTB 15x Manual Therapy: R piriformis release 2 min                                                                                                                     DATE: 07/07/23 Eval    PATIENT EDUCATION:  Education details: Discussed eval findings, rehab rationale and POC and patient is in agreement  Person educated: Patient Education method: Explanation Education comprehension: verbalized understanding and needs further education  HOME EXERCISE PROGRAM: Access Code: [email or web address] URL: [email or web address] Date: 07/07/2023 Prepared by: Gustavus Bryant  Exercises - Static Prone on Elbows  - 2 x daily - 5 x weekly - 1 sets - 1 reps - 120s hold - Supine Piriformis Stretch with Foot on Ground  - 2 x daily - 5 x weekly - 1 sets - 2 reps - 30s hold - Sit to Stand with Arms Crossed  - 2 x daily - 5 x weekly - 1 sets - 5 reps  ASSESSMENT:  CLINICAL IMPRESSION: Presents with ongoing symptoms and now signs of sciatic irritation and tests positive for R sciatic adhesions.  HEP updated to add nerve flossing exercises.  Incorporated additional core tasks with marked weakness  evident.  Patient is a 32 y.o. female who was seen today for physical therapy evaluation and treatment for low back pain. Patient presents with strength deficits in RLE due to disuse atrophy.  Marked soft tissue irritation noted in R piriformis.  Increased 5x STS time confirms soft tissue irritation contributing to symptoms.  Core and trunk strength deficits identified as well.  No neuro tension signs elicited but R hamstring tightness noted.  OBJECTIVE IMPAIRMENTS: Abnormal gait, decreased activity tolerance, decreased endurance, decreased knowledge of condition, decreased mobility, decreased ROM, decreased strength, impaired flexibility, improper body mechanics, postural dysfunction, and pain.   ACTIVITY LIMITATIONS: carrying, lifting, bending, and standing  PARTICIPATION LIMITATIONS: occupation  PERSONAL FACTORS: Age, Past/current experiences, and Time since onset of injury/illness/exacerbation are also affecting patient's functional outcome.   REHAB POTENTIAL: Good  CLINICAL DECISION MAKING: Stable/uncomplicated  EVALUATION COMPLEXITY: Low   GOALS: Goals reviewed with patient? No  SHORT TERM GOALS: Target date: 07/28/2023  Patient to demonstrate independence in HEP  Baseline: [id number] Goal status: Met  2.  Decrease R piriformis irritation from strong to moderate Baseline: Strong tenderness Goal status: INITIAL  3.  Improve R hamstring flexibility to 90d Baseline: 70d Goal status: INITIAL   LONG TERM GOALS: Target date: 08/18/2023    Patient will score at least 47% on FOTO to signify clinically meaningful improvement in functional abilities.   Baseline: 38 Goal status: INITIAL  2.  4/10 pain on average Baseline: 10/10 at worst Goal status: INITIAL  3.  Increase RLE strength to 4/5 Baseline:  MMT Right eval Left eval  Hip flexion 4- 4  Hip extension 4- 4  Hip abduction 4- 4  Hip adduction    Hip internal rotation    Hip external rotation    Knee flexion 4-  4  Knee extension 4- 4  Ankle dorsiflexion    Ankle plantarflexion 4- 4   Goal status: INITIAL  4.  Decrease 5x STS time to 15s Baseline: 23s  Goal status: INITIAL    PLAN:  PT FREQUENCY: 1-2x/week  PT DURATION: 6 weeks  PLANNED INTERVENTIONS: Therapeutic exercises, Therapeutic activity, Neuromuscular re-education, Balance training, Gait training, Patient/Family education, Self Care, Joint mobilization, Aquatic Therapy, Dry Needling, Electrical stimulation, Spinal mobilization, Cryotherapy, Moist heat, Manual therapy, and Re-evaluation.  PLAN FOR NEXT SESSION: HEP review and update, manual techniques as appropriate, aerobic tasks, ROM and flexibility activities, strengthening and PREs, TPDN, gait and balance training as needed     Hildred Laser, PT 08/05/2023, 12:14 PM   Check all possible CPT codes: 29528 - PT Re-evaluation, 97110- Therapeutic Exercise, [phone]- Neuro Re-education, [phone] - Gait Training, [phone] - Manual Therapy, [phone] - Therapeutic Activities, and [phone] - Self Care    Check all conditions that are expected to impact treatment: {Conditions expected to impact treatment:Complications related to surgery   If treatment provided at initial evaluation, no treatment charged due to lack of authorization.

## 2023-08-05 ENCOUNTER — Ambulatory Visit: Payer: Medicaid Other

## 2023-08-05 DIAGNOSIS — M6281 Muscle weakness (generalized): Secondary | ICD-10-CM

## 2023-08-05 DIAGNOSIS — M5459 Other low back pain: Secondary | ICD-10-CM

## 2023-08-05 DIAGNOSIS — M7918 Myalgia, other site: Secondary | ICD-10-CM

## 2023-08-06 NOTE — Therapy (Unsigned)
OUTPATIENT PHYSICAL THERAPY TREATMENT NOTE   Patient Name: [name] MRN: [id number] DOB:03/02/1991, 32 y.o., female Today's Date: 08/10/2023  END OF SESSION:  PT End of Session - 08/10/23 1133     Visit Number 6    Number of Visits 12    Date for PT Re-Evaluation 09/06/23    Authorization Type MCD    PT Start Time 1130    PT Stop Time 1215    PT Time Calculation (min) 45 min    Activity Tolerance Patient limited by pain;Patient tolerated treatment well    Behavior During Therapy [facility] for tasks assessed/performed;Anxious                  Past Medical History:  Diagnosis Date   Abscess    Anginal pain (HCC) 12/15/2022   normal results w/ testing. Pt states she was told it was "probably a strained muscle" that was causing chest pain   Anxiety    Bipolar disorder (HCC)    Depression    Genital herpes    Migraines    "from time to time" per pt   Past Surgical History:  Procedure Laterality Date   CERVICAL DISC SURGERY     fusion   LUMBAR LAMINECTOMY/DECOMPRESSION MICRODISCECTOMY N/A 04/10/2022   Procedure: Lumbar three--four, Lumbar four-five Microdisscectomy;  Surgeon: Coletta Memos, MD;  Location: [facility] OR;  Service: Neurosurgery;  Laterality: N/A;  RM 21 3C   LUMBAR LAMINECTOMY/DECOMPRESSION MICRODISCECTOMY Right 02/04/2023   Procedure: Right Lumbar five-Sacral one Microdiscectomy;  Surgeon: Coletta Memos, MD;  Location: [facility] OR;  Service: Neurosurgery;  Laterality: Right;   MOUTH SURGERY     multiple extractions   Patient Active Problem List   Diagnosis Date Noted   HNP (herniated nucleus pulposus), lumbar 04/10/2022   SOB (shortness of breath) 05/30/2014   Other chest pain 05/30/2014   Rapid heart beat 05/30/2014   Panic attacks 05/30/2014   Anxiety 05/30/2014    [facility]P: [facility]   REFERRING PROVIDER: Coletta Memos, MD  REFERRING DIAG: M51.26 (ICD-10-CM) - Other intervertebral disc displacement, lumbar region  Rationale  for Evaluation and Treatment: Rehabilitation  THERAPY DIAG:  Other low back pain  Piriformis muscle pain  Muscle weakness (generalized)  ONSET DATE: 01/2023  SUBJECTIVE:                                                                                                                                                                                           SUBJECTIVE STATEMENT: Experienced 3 days worth of soreness following last session.  Symptom stabilized since that time but does not appreciate any decrease in  overall symptom intensity.  Has been trying to massage R piriformis region as well as take hot soaks which provides 20-30 minutes relief.  PERTINENT HISTORY:  Pt is a 32 y.o. female who presented 02/04/23 for same day right L5-S1 microdiscectomy. PMH: abscess, anxiety, bipolar disorder, depression, genital herpes, migraines   PAIN:  Are you having pain? Yes: NPRS scale: 10/10 Pain location: low back Pain description: ache Aggravating factors: bending and lifting tasks Relieving factors: rest   PRECAUTIONS: Back  RED FLAGS: None   WEIGHT BEARING RESTRICTIONS: No  FALLS:  Has patient fallen in last 6 months? No  OCCUPATION: not working  PLOF: Independent  PATIENT GOALS: To manage my back symptoms  NEXT MD VISIT: November 2024  OBJECTIVE:   DIAGNOSTIC FINDINGS:  LUMBAR SPINE - 1 VIEW   COMPARISON:  04/10/2022   FINDINGS: Cross-table lateral image of the lumbar spine was obtained during an operative procedure for localization purposes. Based on previous numbering convention, there is lumbarization of the S1 vertebral body, with a rudimentary disc at the S1/S2 level. Surgical instrument is seen overlying the L5-S1 inter spinous region.   IMPRESSION: 1. Surgical instrumentation overlying the L5-S1 interspinous region.     Electronically Signed   By: Sharlet Salina M.D.   On: 02/04/2023 15:26  PATIENT SURVEYS:  FOTO 38(47 predicted) ; 08/10/23  57  SCREENING FOR RED FLAGS: Bowel or bladder incontinence: No  MUSCLE LENGTH: Hamstrings: Right 70 deg; Left 90 deg Thomas test: unremarkable  POSTURE: No Significant postural limitations  PALPATION: TTP R piriformis  LUMBAR ROM: deferred  AROM eval  Flexion   Extension   Right lateral flexion   Left lateral flexion   Right rotation   Left rotation    (Blank rows = not tested)  LOWER EXTREMITY ROM:   WFL B  Passive  Right eval Left eval  Hip flexion    Hip extension    Hip abduction    Hip adduction    Hip internal rotation    Hip external rotation    Knee flexion    Knee extension    Ankle dorsiflexion    Ankle plantarflexion    Ankle inversion    Ankle eversion     (Blank rows = not tested)  LOWER EXTREMITY MMT:    MMT Right eval Left eval  Hip flexion 4- 4  Hip extension 4- 4  Hip abduction 4- 4  Hip adduction    Hip internal rotation    Hip external rotation    Knee flexion 4- 4  Knee extension 4- 4  Ankle dorsiflexion    Ankle plantarflexion 4- 4  Ankle inversion    Ankle eversion     (Blank rows = not tested)  LUMBAR SPECIAL TESTS:  Straight leg raise test: Negative, Slump test: Negative, and piriformis  irritation present 09/05/23 + slump test on R  FUNCTIONAL TESTS:  5 times sit to stand: 23s  GAIT: Distance walked: 6ftx2 Assistive device utilized: None Level of assistance: Complete Independence Comments: slight antalgia  TODAY'S TREATMENT:      [facility] Adult PT Treatment:                                                DATE: 08/10/23 Therapeutic Exercise: Nustep L4 8 min  Prone press 10x f/b 10x with PT OP Seated sciatic floss 10x  Supine hip fallouts GTB 10x B, 10/10 unilaterally Bridge against GTB 10x  R seated hamstring stretch 30s x2 Curl ups with p-ball 15x B, 15/15 unilaterally Seated R piriformis stretch 30s x2 PKB R 30s x2 Runners step in 10/10  [facility] Adult PT Treatment:                                                 DATE: 08/05/23 Therapeutic Exercise: Nustep L4 6 min  Seated sciatic floss 10x  Supine sciatic nerve floss 10x PPT 10x PPT with march 10/10 Curl ups with p-ball 10x B, 10/10 unilaterally R piriformis tennis ball release 2 min Seated R piriformis stretch 30s x2  [facility] Adult PT Treatment:                                                DATE: 08/03/23 Therapeutic Exercise: Nustep L4 6 min  Seated hamstring stretch 30s x2 B QL stretch 30s x2 Prone on elbows 2 min R piriformis stretch 30s x2 (with towel) Supine hip fallouts GTB 10x B 10/10 unilateral Clams GTB B 10/10 Manual Therapy: 2 min R piriformis release  [facility] Adult PT Treatment:                                                DATE: 07/22/23 Therapeutic Exercise: Nustep L2 6 min (R piriformis irritation limiting time) Seated hamstring stretch 30s x2 B QL stretch 30s x2 Prone on elbows 2 min Prone press 10x PPT 3s 10x PPT with alt march 10/10 R piriformis stretch 30s x2 Clams B 15/15  Manual Therapy: 2 min R piriformis release    [facility] Adult PT Treatment:                                                DATE: 07/20/23 Therapeutic Exercise: Nustep L2 6 min (R piriformis irritation limiting time) Seated hamstring stretch 30s x2 B QL stretch 30s x2 Prone on elbows 2 min PPT 3s 10x PPT with alt march 10/10 R piriformis stretch 30s x2 Clams B 15/15 Supine Hip fallouts RTB 15x Manual Therapy: R piriformis release 2 min                                                                                                                     DATE: 07/07/23 Eval    PATIENT EDUCATION:  Education details: Discussed eval findings, rehab rationale and POC and patient is in agreement  Person  educated: Patient Education method: Explanation Education comprehension: verbalized understanding and needs further education  HOME EXERCISE PROGRAM: Access Code: [email or web address] URL: [email or web address] Date: 07/07/2023 Prepared  by: Gustavus Bryant  Exercises - Static Prone on Elbows  - 2 x daily - 5 x weekly - 1 sets - 1 reps - 120s hold - Supine Piriformis Stretch with Foot on Ground  - 2 x daily - 5 x weekly - 1 sets - 2 reps - 30s hold - Sit to Stand with Arms Crossed  - 2 x daily - 5 x weekly - 1 sets - 5 reps  ASSESSMENT:  CLINICAL IMPRESSION: FOTO goal met despite no reports of pain/symptom relief.  Reviewed nerve glides and added more challenging core work, prone hip stretching and more aggressive lumbar extension tasks.  Began hip abductor strengthening against t-band resistance.   Patient is a 32 y.o. female who was seen today for physical therapy evaluation and treatment for low back pain. Patient presents with strength deficits in RLE due to disuse atrophy.  Marked soft tissue irritation noted in R piriformis.  Increased 5x STS time confirms soft tissue irritation contributing to symptoms.  Core and trunk strength deficits identified as well.  No neuro tension signs elicited but R hamstring tightness noted.  OBJECTIVE IMPAIRMENTS: Abnormal gait, decreased activity tolerance, decreased endurance, decreased knowledge of condition, decreased mobility, decreased ROM, decreased strength, impaired flexibility, improper body mechanics, postural dysfunction, and pain.   ACTIVITY LIMITATIONS: carrying, lifting, bending, and standing  PARTICIPATION LIMITATIONS: occupation  PERSONAL FACTORS: Age, Past/current experiences, and Time since onset of injury/illness/exacerbation are also affecting patient's functional outcome.   REHAB POTENTIAL: Good  CLINICAL DECISION MAKING: Stable/uncomplicated  EVALUATION COMPLEXITY: Low   GOALS: Goals reviewed with patient? No  SHORT TERM GOALS: Target date: 07/28/2023  Patient to demonstrate independence in HEP  Baseline: [id number] Goal status: Met  2.  Decrease R piriformis irritation from strong to moderate Baseline: Strong tenderness; 08/10/23 moderate tenderness Goal  status: Met  3.  Improve R hamstring flexibility to 90d Baseline: 70d Goal status: Ongoing   LONG TERM GOALS: Target date: 08/18/2023    Patient will score at least 47% on FOTO to signify clinically meaningful improvement in functional abilities.   Baseline: 38; 08/10/23 57% Goal status: Met  2.  4/10 pain on average Baseline: 10/10 at worst Goal status: INITIAL  3.  Increase RLE strength to 4/5 Baseline:  MMT Right eval Left eval  Hip flexion 4- 4  Hip extension 4- 4  Hip abduction 4- 4  Hip adduction    Hip internal rotation    Hip external rotation    Knee flexion 4- 4  Knee extension 4- 4  Ankle dorsiflexion    Ankle plantarflexion 4- 4   Goal status: INITIAL  4.  Decrease 5x STS time to 15s Baseline: 23s  Goal status: INITIAL    PLAN:  PT FREQUENCY: 1-2x/week  PT DURATION: 6 weeks  PLANNED INTERVENTIONS: Therapeutic exercises, Therapeutic activity, Neuromuscular re-education, Balance training, Gait training, Patient/Family education, Self Care, Joint mobilization, Aquatic Therapy, Dry Needling, Electrical stimulation, Spinal mobilization, Cryotherapy, Moist heat, Manual therapy, and Re-evaluation.  PLAN FOR NEXT SESSION: HEP review and update, manual techniques as appropriate, aerobic tasks, ROM and flexibility activities, strengthening and PREs, TPDN, gait and balance training as needed     Hildred Laser, PT 08/10/2023, 11:53 AM   Check all possible CPT codes: 16109 - PT Re-evaluation, 97110- Therapeutic Exercise, [phone]- Neuro Re-education, [phone] - Gait Training, [phone] -  Manual Therapy, 97530 - Therapeutic Activities, and [phone] - Self Care    Check all conditions that are expected to impact treatment: {Conditions expected to impact treatment:Complications related to surgery   If treatment provided at initial evaluation, no treatment charged due to lack of authorization.

## 2023-08-10 ENCOUNTER — Ambulatory Visit: Payer: Medicaid Other

## 2023-08-10 DIAGNOSIS — M5459 Other low back pain: Secondary | ICD-10-CM

## 2023-08-10 DIAGNOSIS — M6281 Muscle weakness (generalized): Secondary | ICD-10-CM

## 2023-08-10 DIAGNOSIS — M7918 Myalgia, other site: Secondary | ICD-10-CM

## 2023-08-12 ENCOUNTER — Ambulatory Visit: Payer: Medicaid Other

## 2023-08-12 DIAGNOSIS — M5459 Other low back pain: Secondary | ICD-10-CM

## 2023-08-12 DIAGNOSIS — M6281 Muscle weakness (generalized): Secondary | ICD-10-CM

## 2023-08-12 DIAGNOSIS — M7918 Myalgia, other site: Secondary | ICD-10-CM

## 2023-08-12 NOTE — Therapy (Signed)
OUTPATIENT PHYSICAL THERAPY TREATMENT NOTE   Patient Name: [name] MRN: [id number] DOB:[birth year], 32 y.o., female Today's Date: 08/12/2023  END OF SESSION:  PT End of Session - 08/12/23 1131     Visit Number 7    Number of Visits 12    Date for PT Re-Evaluation 09/06/23    Authorization Type MCD    PT Start Time 1130    PT Stop Time 1210    PT Time Calculation (min) 40 min    Activity Tolerance Patient limited by pain;Patient tolerated treatment well    Behavior During Therapy [facility] for tasks assessed/performed;Anxious                   Past Medical History:  Diagnosis Date   Abscess    Anginal pain (HCC) 12/15/2022   normal results w/ testing. Pt states she was told it was "probably a strained muscle" that was causing chest pain   Anxiety    Bipolar disorder (HCC)    Depression    Genital herpes    Migraines    "from time to time" per pt   Past Surgical History:  Procedure Laterality Date   CERVICAL DISC SURGERY     fusion   LUMBAR LAMINECTOMY/DECOMPRESSION MICRODISCECTOMY N/A 04/10/2022   Procedure: Lumbar three--four, Lumbar four-five Microdisscectomy;  Surgeon: Coletta Memos, MD;  Location: [facility] OR;  Service: Neurosurgery;  Laterality: N/A;  RM 21 3C   LUMBAR LAMINECTOMY/DECOMPRESSION MICRODISCECTOMY Right 02/04/2023   Procedure: Right Lumbar five-Sacral one Microdiscectomy;  Surgeon: Coletta Memos, MD;  Location: [facility] OR;  Service: Neurosurgery;  Laterality: Right;   MOUTH SURGERY     multiple extractions   Patient Active Problem List   Diagnosis Date Noted   HNP (herniated nucleus pulposus), lumbar 04/10/2022   SOB (shortness of breath) 05/30/2014   Other chest pain 05/30/2014   Rapid heart beat 05/30/2014   Panic attacks 05/30/2014   Anxiety 05/30/2014    [facility]P: [facility]   REFERRING PROVIDER: Coletta Memos, MD  REFERRING DIAG: M51.26 (ICD-10-CM) - Other intervertebral disc displacement, lumbar  region  Rationale for Evaluation and Treatment: Rehabilitation  THERAPY DIAG:  Other low back pain  Piriformis muscle pain  Muscle weakness (generalized)  ONSET DATE: 01/2023  SUBJECTIVE:                                                                                                                                                                                           SUBJECTIVE STATEMENT: Felt a "pop" in her low back/R SI region which led to paresthesias in R thigh not extending past knee however.  PERTINENT HISTORY:  Pt is a 32 y.o. female who presented 02/04/23 for same day right L5-S1 microdiscectomy. PMH: abscess, anxiety, bipolar disorder, depression, genital herpes, migraines   PAIN:  Are you having pain? Yes: NPRS scale: 10/10 Pain location: low back Pain description: ache Aggravating factors: bending and lifting tasks Relieving factors: rest   PRECAUTIONS: Back  RED FLAGS: None   WEIGHT BEARING RESTRICTIONS: No  FALLS:  Has patient fallen in last 6 months? No  OCCUPATION: not working  PLOF: Independent  PATIENT GOALS: To manage my back symptoms  NEXT MD VISIT: November 2024  OBJECTIVE:   DIAGNOSTIC FINDINGS:  LUMBAR SPINE - 1 VIEW   COMPARISON:  04/10/2022   FINDINGS: Cross-table lateral image of the lumbar spine was obtained during an operative procedure for localization purposes. Based on previous numbering convention, there is lumbarization of the S1 vertebral body, with a rudimentary disc at the S1/S2 level. Surgical instrument is seen overlying the L5-S1 inter spinous region.   IMPRESSION: 1. Surgical instrumentation overlying the L5-S1 interspinous region.     Electronically Signed   By: Sharlet Salina M.D.   On: 02/04/2023 15:26  PATIENT SURVEYS:  FOTO 38(47 predicted) ; 08/10/23 57  SCREENING FOR RED FLAGS: Bowel or bladder incontinence: No  MUSCLE LENGTH: Hamstrings: Right 70 deg; Left 90 deg Thomas test:  unremarkable  POSTURE: No Significant postural limitations  PALPATION: TTP R piriformis  LUMBAR ROM: deferred  AROM eval  Flexion   Extension   Right lateral flexion   Left lateral flexion   Right rotation   Left rotation    (Blank rows = not tested)  LOWER EXTREMITY ROM:   WFL B  Passive  Right eval Left eval  Hip flexion    Hip extension    Hip abduction    Hip adduction    Hip internal rotation    Hip external rotation    Knee flexion    Knee extension    Ankle dorsiflexion    Ankle plantarflexion    Ankle inversion    Ankle eversion     (Blank rows = not tested)  LOWER EXTREMITY MMT:    MMT Right eval Left eval  Hip flexion 4- 4  Hip extension 4- 4  Hip abduction 4- 4  Hip adduction    Hip internal rotation    Hip external rotation    Knee flexion 4- 4  Knee extension 4- 4  Ankle dorsiflexion    Ankle plantarflexion 4- 4  Ankle inversion    Ankle eversion     (Blank rows = not tested)  LUMBAR SPECIAL TESTS:  Straight leg raise test: Negative, Slump test: Negative, and piriformis  irritation present 09/05/23 + slump test on R  FUNCTIONAL TESTS:  5 times sit to stand: 23s  GAIT: Distance walked: 74ftx2 Assistive device utilized: None Level of assistance: Complete Independence Comments: slight antalgia  TODAY'S TREATMENT:      [facility] Adult PT Treatment:                                                DATE: 08/12/23 Therapeutic Exercise: Nustep L2 6 min  Supine hip fallouts GTB 15x B, 15/15 unilaterally Curl ups with p-ball 15x B, 15/15 unilaterally Manual Therapy: MET to correct anterior ilial rotation, 3 bouts Therapeutic Activity: + stork sign R, RLE longer, R ASIS  depressed  [facility] Adult PT Treatment:                                                DATE: 08/10/23 Therapeutic Exercise: Nustep L4 8 min  Prone press 10x f/b 10x with PT OP Seated sciatic floss 10x  Supine hip fallouts GTB 10x B, 10/10 unilaterally Bridge against GTB 10x   R seated hamstring stretch 30s x2 Curl ups with p-ball 15x B, 15/15 unilaterally Seated R piriformis stretch 30s x2 PKB R 30s x2 Runners step in 10/10  [facility] Adult PT Treatment:                                                DATE: 08/05/23 Therapeutic Exercise: Nustep L4 6 min  Seated sciatic floss 10x  Supine sciatic nerve floss 10x PPT 10x PPT with march 10/10 Curl ups with p-ball 10x B, 10/10 unilaterally R piriformis tennis ball release 2 min Seated R piriformis stretch 30s x2  [facility] Adult PT Treatment:                                                DATE: 08/03/23 Therapeutic Exercise: Nustep L4 6 min  Seated hamstring stretch 30s x2 B QL stretch 30s x2 Prone on elbows 2 min R piriformis stretch 30s x2 (with towel) Supine hip fallouts GTB 10x B 10/10 unilateral Clams GTB B 10/10 Manual Therapy: 2 min R piriformis release  [facility] Adult PT Treatment:                                                DATE: 07/22/23 Therapeutic Exercise: Nustep L2 6 min (R piriformis irritation limiting time) Seated hamstring stretch 30s x2 B QL stretch 30s x2 Prone on elbows 2 min Prone press 10x PPT 3s 10x PPT with alt march 10/10 R piriformis stretch 30s x2 Clams B 15/15  Manual Therapy: 2 min R piriformis release    [facility] Adult PT Treatment:                                                DATE: 07/20/23 Therapeutic Exercise: Nustep L2 6 min (R piriformis irritation limiting time) Seated hamstring stretch 30s x2 B QL stretch 30s x2 Prone on elbows 2 min PPT 3s 10x PPT with alt march 10/10 R piriformis stretch 30s x2 Clams B 15/15 Supine Hip fallouts RTB 15x Manual Therapy: R piriformis release 2 min  DATE: 07/07/23 Eval    PATIENT EDUCATION:  Education details: Discussed eval findings, rehab rationale and POC and patient is in agreement  Person educated:  Patient Education method: Explanation Education comprehension: verbalized understanding and needs further education  HOME EXERCISE PROGRAM: Access Code: [email or web address] URL: [email or web address] Date: 07/07/2023 Prepared by: Gustavus Bryant  Exercises - Static Prone on Elbows  - 2 x daily - 5 x weekly - 1 sets - 1 reps - 120s hold - Supine Piriformis Stretch with Foot on Ground  - 2 x daily - 5 x weekly - 1 sets - 2 reps - 30s hold - Sit to Stand with Arms Crossed  - 2 x daily - 5 x weekly - 1 sets - 5 reps  ASSESSMENT:  CLINICAL IMPRESSION: Arrives to session with c/o of new symptoms localized to R SI region.  Assessed fo R anterior iliac rotation and MET provided to correct.  Finding of + R stork, leg length and ASIS dysfunctions corrected.  Remainder of session spent with core strengthening avoiding any stertching or stress to R SI region.  Will refrain from any stretching over next 48 hours and ice over that period if beneficial.  Patient is a 32 y.o. female who was seen today for physical therapy evaluation and treatment for low back pain. Patient presents with strength deficits in RLE due to disuse atrophy.  Marked soft tissue irritation noted in R piriformis.  Increased 5x STS time confirms soft tissue irritation contributing to symptoms.  Core and trunk strength deficits identified as well.  No neuro tension signs elicited but R hamstring tightness noted.  OBJECTIVE IMPAIRMENTS: Abnormal gait, decreased activity tolerance, decreased endurance, decreased knowledge of condition, decreased mobility, decreased ROM, decreased strength, impaired flexibility, improper body mechanics, postural dysfunction, and pain.   ACTIVITY LIMITATIONS: carrying, lifting, bending, and standing  PARTICIPATION LIMITATIONS: occupation  PERSONAL FACTORS: Age, Past/current experiences, and Time since onset of injury/illness/exacerbation are also affecting patient's functional outcome.   REHAB  POTENTIAL: Good  CLINICAL DECISION MAKING: Stable/uncomplicated  EVALUATION COMPLEXITY: Low   GOALS: Goals reviewed with patient? No  SHORT TERM GOALS: Target date: 07/28/2023  Patient to demonstrate independence in HEP  Baseline: [email or web address] Goal status: Met  2.  Decrease R piriformis irritation from strong to moderate Baseline: Strong tenderness; 08/10/23 moderate tenderness Goal status: Met  3.  Improve R hamstring flexibility to 90d Baseline: 70d Goal status: Ongoing   LONG TERM GOALS: Target date: 08/18/2023    Patient will score at least 47% on FOTO to signify clinically meaningful improvement in functional abilities.   Baseline: 38; 08/10/23 57% Goal status: Met  2.  4/10 pain on average Baseline: 10/10 at worst Goal status: INITIAL  3.  Increase RLE strength to 4/5 Baseline:  MMT Right eval Left eval  Hip flexion 4- 4  Hip extension 4- 4  Hip abduction 4- 4  Hip adduction    Hip internal rotation    Hip external rotation    Knee flexion 4- 4  Knee extension 4- 4  Ankle dorsiflexion    Ankle plantarflexion 4- 4   Goal status: INITIAL  4.  Decrease 5x STS time to 15s Baseline: 23s  Goal status: INITIAL    PLAN:  PT FREQUENCY: 1-2x/week  PT DURATION: 6 weeks  PLANNED INTERVENTIONS: Therapeutic exercises, Therapeutic activity, Neuromuscular re-education, Balance training, Gait training, Patient/Family education, Self Care, Joint mobilization, Aquatic Therapy, Dry Needling, Electrical stimulation, Spinal mobilization, Cryotherapy, Moist heat, Manual therapy, and Re-evaluation.  PLAN FOR  NEXT SESSION: HEP review and update, manual techniques as appropriate, aerobic tasks, ROM and flexibility activities, strengthening and PREs, TPDN, gait and balance training as needed     Hildred Laser, PT 08/12/2023, 12:15 PM   Check all possible CPT codes: 82956 - PT Re-evaluation, 97110- Therapeutic Exercise, [phone]- Neuro Re-education, [phone] - Gait  Training, [phone] - Manual Therapy, [phone] - Therapeutic Activities, and [phone] - Self Care    Check all conditions that are expected to impact treatment: {Conditions expected to impact treatment:Complications related to surgery   If treatment provided at initial evaluation, no treatment charged due to lack of authorization.

## 2023-08-17 ENCOUNTER — Ambulatory Visit: Payer: Medicaid Other

## 2023-08-17 DIAGNOSIS — M5459 Other low back pain: Secondary | ICD-10-CM | POA: Diagnosis not present

## 2023-08-17 DIAGNOSIS — M6281 Muscle weakness (generalized): Secondary | ICD-10-CM

## 2023-08-17 DIAGNOSIS — M7918 Myalgia, other site: Secondary | ICD-10-CM

## 2023-08-17 NOTE — Therapy (Signed)
OUTPATIENT PHYSICAL THERAPY TREATMENT NOTE   Patient Name: [name] MRN: [id number] DOB:1991-09-27, 32 y.o., female Today's Date: 08/17/2023  END OF SESSION:  PT End of Session - 08/17/23 1137     Visit Number 8    Number of Visits 12    Date for PT Re-Evaluation 09/06/23    Authorization Type MCD    PT Start Time 1135    PT Stop Time 1215    PT Time Calculation (min) 40 min    Activity Tolerance Patient limited by pain;Patient tolerated treatment well    Behavior During Therapy [facility] for tasks assessed/performed;Anxious                   Past Medical History:  Diagnosis Date   Abscess    Anginal pain (HCC) 12/15/2022   normal results w/ testing. Pt states she was told it was "probably a strained muscle" that was causing chest pain   Anxiety    Bipolar disorder (HCC)    Depression    Genital herpes    Migraines    "from time to time" per pt   Past Surgical History:  Procedure Laterality Date   CERVICAL DISC SURGERY     fusion   LUMBAR LAMINECTOMY/DECOMPRESSION MICRODISCECTOMY N/A 04/10/2022   Procedure: Lumbar three--four, Lumbar four-five Microdisscectomy;  Surgeon: Coletta Memos, MD;  Location: [facility] OR;  Service: Neurosurgery;  Laterality: N/A;  RM 21 3C   LUMBAR LAMINECTOMY/DECOMPRESSION MICRODISCECTOMY Right 02/04/2023   Procedure: Right Lumbar five-Sacral one Microdiscectomy;  Surgeon: Coletta Memos, MD;  Location: [facility] OR;  Service: Neurosurgery;  Laterality: Right;   MOUTH SURGERY     multiple extractions   Patient Active Problem List   Diagnosis Date Noted   HNP (herniated nucleus pulposus), lumbar 04/10/2022   SOB (shortness of breath) 05/30/2014   Other chest pain 05/30/2014   Rapid heart beat 05/30/2014   Panic attacks 05/30/2014   Anxiety 05/30/2014    [facility]P: [facility]   REFERRING PROVIDER: Coletta Memos, MD  REFERRING DIAG: M51.26 (ICD-10-CM) - Other intervertebral disc displacement, lumbar  region  Rationale for Evaluation and Treatment: Rehabilitation  THERAPY DIAG:  Other low back pain  Piriformis muscle pain  Muscle weakness (generalized)  ONSET DATE: 01/2023  SUBJECTIVE:                                                                                                                                                                                           SUBJECTIVE STATEMENT: Felt improvement following last session and MET but then helped build a deck and spent 2 nights on an air mattress  which exacerbated symptoms.  New symptoms feel more muscular vs previous sharper more localized discomfort  PERTINENT HISTORY:  Pt is a 32 y.o. female who presented 02/04/23 for same day right L5-S1 microdiscectomy. PMH: abscess, anxiety, bipolar disorder, depression, genital herpes, migraines   PAIN:  Are you having pain? Yes: NPRS scale: 10/10 Pain location: low back Pain description: ache Aggravating factors: bending and lifting tasks Relieving factors: rest   PRECAUTIONS: Back  RED FLAGS: None   WEIGHT BEARING RESTRICTIONS: No  FALLS:  Has patient fallen in last 6 months? No  OCCUPATION: not working  PLOF: Independent  PATIENT GOALS: To manage my back symptoms  NEXT MD VISIT: November 2024  OBJECTIVE:   DIAGNOSTIC FINDINGS:  LUMBAR SPINE - 1 VIEW   COMPARISON:  04/10/2022   FINDINGS: Cross-table lateral image of the lumbar spine was obtained during an operative procedure for localization purposes. Based on previous numbering convention, there is lumbarization of the S1 vertebral body, with a rudimentary disc at the S1/S2 level. Surgical instrument is seen overlying the L5-S1 inter spinous region.   IMPRESSION: 1. Surgical instrumentation overlying the L5-S1 interspinous region.     Electronically Signed   By: Sharlet Salina M.D.   On: 02/04/2023 15:26  PATIENT SURVEYS:  FOTO 38(47 predicted) ; 08/10/23 57  SCREENING FOR RED FLAGS: Bowel or  bladder incontinence: No  MUSCLE LENGTH: Hamstrings: Right 70 deg; Left 90 deg Thomas test: unremarkable  POSTURE: No Significant postural limitations  PALPATION: TTP R piriformis  LUMBAR ROM: deferred  AROM eval  Flexion   Extension   Right lateral flexion   Left lateral flexion   Right rotation   Left rotation    (Blank rows = not tested)  LOWER EXTREMITY ROM:   WFL B  Passive  Right eval Left eval  Hip flexion    Hip extension    Hip abduction    Hip adduction    Hip internal rotation    Hip external rotation    Knee flexion    Knee extension    Ankle dorsiflexion    Ankle plantarflexion    Ankle inversion    Ankle eversion     (Blank rows = not tested)  LOWER EXTREMITY MMT:    MMT Right eval Left eval  Hip flexion 4- 4  Hip extension 4- 4  Hip abduction 4- 4  Hip adduction    Hip internal rotation    Hip external rotation    Knee flexion 4- 4  Knee extension 4- 4  Ankle dorsiflexion    Ankle plantarflexion 4- 4  Ankle inversion    Ankle eversion     (Blank rows = not tested)  LUMBAR SPECIAL TESTS:  Straight leg raise test: Negative, Slump test: Negative, and piriformis  irritation present 09/05/23 + slump test on R  FUNCTIONAL TESTS:  5 times sit to stand: 23s  GAIT: Distance walked: 79ftx2 Assistive device utilized: None Level of assistance: Complete Independence Comments: slight antalgia  TODAY'S TREATMENT:      [facility] Adult PT Treatment:                                                DATE: 08/17/23 Therapeutic Exercise: Nustep L4 8 min  Prone press 10x f/b 10x with PT OP Prone on elbows 2 min Supine hip fallouts GTB 15x B, 15/15  unilaterally Bridge against GTB 15x  S/L clams GTB 15/15 Curl ups with p-ball 15x B, 15/15 unilaterally Dead bug with p-ball 10/10  [facility] Adult PT Treatment:                                                DATE: 08/12/23 Therapeutic Exercise: Nustep L2 6 min  Supine hip fallouts GTB 15x B, 15/15  unilaterally Curl ups with p-ball 15x B, 15/15 unilaterally Manual Therapy: MET to correct anterior ilial rotation, 3 bouts Therapeutic Activity: + stork sign R, RLE longer, R ASIS depressed  [facility] Adult PT Treatment:                                                DATE: 08/10/23 Therapeutic Exercise: Nustep L4 8 min  Prone press 10x f/b 10x with PT OP Seated sciatic floss 10x  Supine hip fallouts GTB 10x B, 10/10 unilaterally Bridge against GTB 10x  R seated hamstring stretch 30s x2 Curl ups with p-ball 15x B, 15/15 unilaterally Seated R piriformis stretch 30s x2 PKB R 30s x2 Runners step in 10/10  [facility] Adult PT Treatment:                                                DATE: 08/05/23 Therapeutic Exercise: Nustep L4 6 min  Seated sciatic floss 10x  Supine sciatic nerve floss 10x PPT 10x PPT with march 10/10 Curl ups with p-ball 10x B, 10/10 unilaterally R piriformis tennis ball release 2 min Seated R piriformis stretch 30s x2  [facility] Adult PT Treatment:                                                DATE: 08/03/23 Therapeutic Exercise: Nustep L4 6 min  Seated hamstring stretch 30s x2 B QL stretch 30s x2 Prone on elbows 2 min R piriformis stretch 30s x2 (with towel) Supine hip fallouts GTB 10x B 10/10 unilateral Clams GTB B 10/10 Manual Therapy: 2 min R piriformis release  [facility] Adult PT Treatment:                                                DATE: 07/22/23 Therapeutic Exercise: Nustep L2 6 min (R piriformis irritation limiting time) Seated hamstring stretch 30s x2 B QL stretch 30s x2 Prone on elbows 2 min Prone press 10x PPT 3s 10x PPT with alt march 10/10 R piriformis stretch 30s x2 Clams B 15/15  Manual Therapy: 2 min R piriformis release    [facility] Adult PT Treatment:                                                DATE: 07/20/23  Therapeutic Exercise: Nustep L2 6 min (R piriformis irritation limiting time) Seated hamstring stretch 30s x2 B QL stretch 30s  x2 Prone on elbows 2 min PPT 3s 10x PPT with alt march 10/10 R piriformis stretch 30s x2 Clams B 15/15 Supine Hip fallouts RTB 15x Manual Therapy: R piriformis release 2 min                                                                                                                     DATE: 07/07/23 Eval    PATIENT EDUCATION:  Education details: Discussed eval findings, rehab rationale and POC and patient is in agreement  Person educated: Patient Education method: Explanation Education comprehension: verbalized understanding and needs further education  HOME EXERCISE PROGRAM: Access Code: [email or web address] URL: [email or web address] Date: 08/17/2023 Prepared by: Gustavus Bryant  Exercises - Static Prone on Elbows  - 2 x daily - 5 x weekly - 1 sets - 1 reps - 120s hold - Supine Piriformis Stretch with Foot on Ground  - 2 x daily - 5 x weekly - 1 sets - 2 reps - 30s hold - Sit to Stand with Arms Crossed  - 2 x daily - 5 x weekly - 1 sets - 5 reps - Seated Sciatic Tensioner  - 3 x daily - 5 x weekly - 1 sets - 10 reps - Supine Sciatic Nerve Glide  - 3 x daily - 5 x weekly - 1 sets - 10 reps - Clamshell with Resistance  - 2 x daily - 5 x weekly - 1 sets - 15 reps  ASSESSMENT:  CLINICAL IMPRESSION: Returns to PT with new symptoms of muscle strain following an increase in activity including heavy lifting and twisting.  SI alignment WNL. Focus of session was hip and core strengthening with continued attention to lumbar extension.  Less discomfort with R slump test. Updated HEP as noted.  Patient is a 31 y.o. female who was seen today for physical therapy evaluation and treatment for low back pain. Patient presents with strength deficits in RLE due to disuse atrophy.  Marked soft tissue irritation noted in R piriformis.  Increased 5x STS time confirms soft tissue irritation contributing to symptoms.  Core and trunk strength deficits identified as well.  No neuro tension signs  elicited but R hamstring tightness noted.  OBJECTIVE IMPAIRMENTS: Abnormal gait, decreased activity tolerance, decreased endurance, decreased knowledge of condition, decreased mobility, decreased ROM, decreased strength, impaired flexibility, improper body mechanics, postural dysfunction, and pain.   ACTIVITY LIMITATIONS: carrying, lifting, bending, and standing  PARTICIPATION LIMITATIONS: occupation  PERSONAL FACTORS: Age, Past/current experiences, and Time since onset of injury/illness/exacerbation are also affecting patient's functional outcome.   REHAB POTENTIAL: Good  CLINICAL DECISION MAKING: Stable/uncomplicated  EVALUATION COMPLEXITY: Low   GOALS: Goals reviewed with patient? No  SHORT TERM GOALS: Target date: 07/28/2023  Patient to demonstrate independence in HEP  Baseline: [id number] Goal status: Met  2.  Decrease R piriformis irritation from strong to moderate Baseline: Strong tenderness; 08/10/23 moderate  tenderness Goal status: Met  3.  Improve R hamstring flexibility to 90d Baseline: 70d Goal status: Ongoing   LONG TERM GOALS: Target date: 08/18/2023    Patient will score at least 47% on FOTO to signify clinically meaningful improvement in functional abilities.   Baseline: 38; 08/10/23 57% Goal status: Met  2.  4/10 pain on average Baseline: 10/10 at worst Goal status: INITIAL  3.  Increase RLE strength to 4/5 Baseline:  MMT Right eval Left eval  Hip flexion 4- 4  Hip extension 4- 4  Hip abduction 4- 4  Hip adduction    Hip internal rotation    Hip external rotation    Knee flexion 4- 4  Knee extension 4- 4  Ankle dorsiflexion    Ankle plantarflexion 4- 4   Goal status: INITIAL  4.  Decrease 5x STS time to 15s Baseline: 23s  Goal status: INITIAL    PLAN:  PT FREQUENCY: 1-2x/week  PT DURATION: 6 weeks  PLANNED INTERVENTIONS: Therapeutic exercises, Therapeutic activity, Neuromuscular re-education, Balance training, Gait training,  Patient/Family education, Self Care, Joint mobilization, Aquatic Therapy, Dry Needling, Electrical stimulation, Spinal mobilization, Cryotherapy, Moist heat, Manual therapy, and Re-evaluation.  PLAN FOR NEXT SESSION: HEP review and update, manual techniques as appropriate, aerobic tasks, ROM and flexibility activities, strengthening and PREs, TPDN, gait and balance training as needed     Hildred Laser, PT 08/17/2023, 12:11 PM   Check all possible CPT codes: 82956 - PT Re-evaluation, 97110- Therapeutic Exercise, [id number]- Neuro Re-education, [phone] - Gait Training, [phone] - Manual Therapy, [phone] - Therapeutic Activities, and [phone] - Self Care    Check all conditions that are expected to impact treatment: {Conditions expected to impact treatment:Complications related to surgery   If treatment provided at initial evaluation, no treatment charged due to lack of authorization.

## 2023-08-25 NOTE — Therapy (Unsigned)
OUTPATIENT PHYSICAL THERAPY TREATMENT NOTE   Patient Name: [name] MRN: [id number] DOB:1991/01/04, 32 y.o., female Today's Date: 08/26/2023  END OF SESSION:  PT End of Session - 08/26/23 1705     Visit Number 9    Number of Visits 12    Date for PT Re-Evaluation 09/06/23    Authorization Type MCD    PT Start Time 1700    PT Stop Time 1740    PT Time Calculation (min) 40 min    Activity Tolerance Patient limited by pain;Patient tolerated treatment well    Behavior During Therapy [facility] for tasks assessed/performed;Anxious            Past Medical History:  Diagnosis Date   Abscess    Anginal pain (HCC) 12/15/2022   normal results w/ testing. Pt states she was told it was "probably a strained muscle" that was causing chest pain   Anxiety    Bipolar disorder (HCC)    Depression    Genital herpes    Migraines    "from time to time" per pt   Past Surgical History:  Procedure Laterality Date   CERVICAL DISC SURGERY     fusion   LUMBAR LAMINECTOMY/DECOMPRESSION MICRODISCECTOMY N/A 04/10/2022   Procedure: Lumbar three--four, Lumbar four-five Microdisscectomy;  Surgeon: Coletta Memos, MD;  Location: [facility] OR;  Service: Neurosurgery;  Laterality: N/A;  RM 21 3C   LUMBAR LAMINECTOMY/DECOMPRESSION MICRODISCECTOMY Right 02/04/2023   Procedure: Right Lumbar five-Sacral one Microdiscectomy;  Surgeon: Coletta Memos, MD;  Location: [facility] OR;  Service: Neurosurgery;  Laterality: Right;   MOUTH SURGERY     multiple extractions   Patient Active Problem List   Diagnosis Date Noted   HNP (herniated nucleus pulposus), lumbar 04/10/2022   SOB (shortness of breath) 05/30/2014   Other chest pain 05/30/2014   Rapid heart beat 05/30/2014   Panic attacks 05/30/2014   Anxiety 05/30/2014    PCP: [facility]   REFERRING PROVIDER: Coletta Memos, MD  REFERRING DIAG: M51.26 (ICD-10-CM) - Other intervertebral disc displacement, lumbar region  Rationale for  Evaluation and Treatment: Rehabilitation  THERAPY DIAG:  Piriformis muscle pain  Muscle weakness (generalized)  Other low back pain  ONSET DATE: 01/2023  SUBJECTIVE:                                                                                                                                                                                           SUBJECTIVE STATEMENT: Saw surgeon today and will undergo another series of "steroid injections", no further information available.Continued R lumbosacral symptoms.  PERTINENT HISTORY:  Pt is a 32 y.o. female  who presented 02/04/23 for same day right L5-S1 microdiscectomy. PMH: abscess, anxiety, bipolar disorder, depression, genital herpes, migraines   PAIN:  Are you having pain? Yes: NPRS scale: 10/10 Pain location: low back Pain description: ache Aggravating factors: bending and lifting tasks Relieving factors: rest   PRECAUTIONS: Back  RED FLAGS: None   WEIGHT BEARING RESTRICTIONS: No  FALLS:  Has patient fallen in last 6 months? No  OCCUPATION: not working  PLOF: Independent  PATIENT GOALS: To manage my back symptoms  NEXT MD VISIT: November 2024  OBJECTIVE:   DIAGNOSTIC FINDINGS:  LUMBAR SPINE - 1 VIEW   COMPARISON:  04/10/2022   FINDINGS: Cross-table lateral image of the lumbar spine was obtained during an operative procedure for localization purposes. Based on previous numbering convention, there is lumbarization of the S1 vertebral body, with a rudimentary disc at the S1/S2 level. Surgical instrument is seen overlying the L5-S1 inter spinous region.   IMPRESSION: 1. Surgical instrumentation overlying the L5-S1 interspinous region.     Electronically Signed   By: Sharlet Salina M.D.   On: 02/04/2023 15:26  PATIENT SURVEYS:  FOTO 38(47 predicted) ; 08/10/23 57  SCREENING FOR RED FLAGS: Bowel or bladder incontinence: No  MUSCLE LENGTH: Hamstrings: Right 70 deg; Left 90 deg Thomas test:  unremarkable  POSTURE: No Significant postural limitations  PALPATION: TTP R piriformis  LUMBAR ROM: deferred  AROM eval  Flexion   Extension   Right lateral flexion   Left lateral flexion   Right rotation   Left rotation    (Blank rows = not tested)  LOWER EXTREMITY ROM:   WFL B  Passive  Right eval Left eval  Hip flexion    Hip extension    Hip abduction    Hip adduction    Hip internal rotation    Hip external rotation    Knee flexion    Knee extension    Ankle dorsiflexion    Ankle plantarflexion    Ankle inversion    Ankle eversion     (Blank rows = not tested)  LOWER EXTREMITY MMT:    MMT Right eval Left eval  Hip flexion 4- 4  Hip extension 4- 4  Hip abduction 4- 4  Hip adduction    Hip internal rotation    Hip external rotation    Knee flexion 4- 4  Knee extension 4- 4  Ankle dorsiflexion    Ankle plantarflexion 4- 4  Ankle inversion    Ankle eversion     (Blank rows = not tested)  LUMBAR SPECIAL TESTS:  Straight leg raise test: Negative, Slump test: Negative, and piriformis  irritation present 09/05/23 + slump test on R  FUNCTIONAL TESTS:  5 times sit to stand: 23s  GAIT: Distance walked: 78ftx2 Assistive device utilized: None Level of assistance: Complete Independence Comments: slight antalgia  TODAY'S TREATMENT:      [facility] Adult PT Treatment:                                                DATE: 08/26/23 Therapeutic Exercise: Nustep L5 8 min  Plank on knees 30s x2 Plank on knees with LE extension 30s B Prone on elbows 2 min Bird dog 10/10 Supine hip fallouts BluTB 15x B, 15/15 unilaterally Bridge against BluTB 15x  S/L clams BluTB 15/15     [facility] Adult PT  Treatment:                                                DATE: 08/17/23 Therapeutic Exercise: Nustep L4 8 min  Prone press 10x f/b 10x with PT OP Prone on elbows 2 min Supine hip fallouts GTB 15x B, 15/15 unilaterally Bridge against GTB 15x  S/L clams GTB 15/15 Curl  ups with p-ball 15x B, 15/15 unilaterally Dead bug with p-ball 10/10  [facility] Adult PT Treatment:                                                DATE: 08/12/23 Therapeutic Exercise: Nustep L2 6 min  Supine hip fallouts GTB 15x B, 15/15 unilaterally Curl ups with p-ball 15x B, 15/15 unilaterally Manual Therapy: MET to correct anterior ilial rotation, 3 bouts Therapeutic Activity: + stork sign R, RLE longer, R ASIS depressed  [facility] Adult PT Treatment:                                                DATE: 08/10/23 Therapeutic Exercise: Nustep L4 8 min  Prone press 10x f/b 10x with PT OP Seated sciatic floss 10x  Supine hip fallouts GTB 10x B, 10/10 unilaterally Bridge against GTB 10x  R seated hamstring stretch 30s x2 Curl ups with p-ball 15x B, 15/15 unilaterally Seated R piriformis stretch 30s x2 PKB R 30s x2 Runners step in 10/10  [facility] Adult PT Treatment:                                                DATE: 08/05/23 Therapeutic Exercise: Nustep L4 6 min  Seated sciatic floss 10x  Supine sciatic nerve floss 10x PPT 10x PPT with march 10/10 Curl ups with p-ball 10x B, 10/10 unilaterally R piriformis tennis ball release 2 min Seated R piriformis stretch 30s x2  [facility] Adult PT Treatment:                                                DATE: 08/03/23 Therapeutic Exercise: Nustep L4 6 min  Seated hamstring stretch 30s x2 B QL stretch 30s x2 Prone on elbows 2 min R piriformis stretch 30s x2 (with towel) Supine hip fallouts GTB 10x B 10/10 unilateral Clams GTB B 10/10 Manual Therapy: 2 min R piriformis release  [facility] Adult PT Treatment:                                                DATE: 07/22/23 Therapeutic Exercise: Nustep L2 6 min (R piriformis irritation limiting time) Seated hamstring stretch 30s x2 B QL stretch 30s x2 Prone on elbows 2 min Prone press 10x PPT 3s 10x  PPT with alt march 10/10 R piriformis stretch 30s x2 Clams B 15/15  Manual Therapy: 2 min R  piriformis release    [facility] Adult PT Treatment:                                                DATE: 07/20/23 Therapeutic Exercise: Nustep L2 6 min (R piriformis irritation limiting time) Seated hamstring stretch 30s x2 B QL stretch 30s x2 Prone on elbows 2 min PPT 3s 10x PPT with alt march 10/10 R piriformis stretch 30s x2 Clams B 15/15 Supine Hip fallouts RTB 15x Manual Therapy: R piriformis release 2 min                                                                                                                     DATE: 07/07/23 Eval    PATIENT EDUCATION:  Education details: Discussed eval findings, rehab rationale and POC and patient is in agreement  Person educated: Patient Education method: Explanation Education comprehension: verbalized understanding and needs further education  HOME EXERCISE PROGRAM: Access Code: [email or web address] URL: [email or web address] Date: 08/17/2023 Prepared by: Gustavus Bryant  Exercises - Static Prone on Elbows  - 2 x daily - 5 x weekly - 1 sets - 1 reps - 120s hold - Supine Piriformis Stretch with Foot on Ground  - 2 x daily - 5 x weekly - 1 sets - 2 reps - 30s hold - Sit to Stand with Arms Crossed  - 2 x daily - 5 x weekly - 1 sets - 5 reps - Seated Sciatic Tensioner  - 3 x daily - 5 x weekly - 1 sets - 10 reps - Supine Sciatic Nerve Glide  - 3 x daily - 5 x weekly - 1 sets - 10 reps - Clamshell with Resistance  - 2 x daily - 5 x weekly - 1 sets - 15 reps  ASSESSMENT:  CLINICAL IMPRESSION: No distinct gait abnormality observed.  Able to advance resistance on all exercises as well as tolerate planking tasks.  Patient is a 32 y.o. female who was seen today for physical therapy evaluation and treatment for low back pain. Patient presents with strength deficits in RLE due to disuse atrophy.  Marked soft tissue irritation noted in R piriformis.  Increased 5x STS time confirms soft tissue irritation contributing to symptoms.  Core and  trunk strength deficits identified as well.  No neuro tension signs elicited but R hamstring tightness noted.  OBJECTIVE IMPAIRMENTS: Abnormal gait, decreased activity tolerance, decreased endurance, decreased knowledge of condition, decreased mobility, decreased ROM, decreased strength, impaired flexibility, improper body mechanics, postural dysfunction, and pain.   ACTIVITY LIMITATIONS: carrying, lifting, bending, and standing  PARTICIPATION LIMITATIONS: occupation  PERSONAL FACTORS: Age, Past/current experiences, and Time since onset of injury/illness/exacerbation are also affecting patient's functional outcome.   REHAB POTENTIAL: Good  CLINICAL DECISION MAKING: Stable/uncomplicated  EVALUATION COMPLEXITY: Low   GOALS: Goals reviewed with patient? No  SHORT TERM GOALS: Target date: 07/28/2023  Patient to demonstrate independence in HEP  Baseline: [email or web address] Goal status: Met  2.  Decrease R piriformis irritation from strong to moderate Baseline: Strong tenderness; 08/10/23 moderate tenderness Goal status: Met  3.  Improve R hamstring flexibility to 90d Baseline: 70d Goal status: Ongoing   LONG TERM GOALS: Target date: 08/18/2023    Patient will score at least 47% on FOTO to signify clinically meaningful improvement in functional abilities.   Baseline: 38; 08/10/23 57% Goal status: Met  2.  4/10 pain on average Baseline: 10/10 at worst Goal status: INITIAL  3.  Increase RLE strength to 4/5 Baseline:  MMT Right eval Left eval  Hip flexion 4- 4  Hip extension 4- 4  Hip abduction 4- 4  Hip adduction    Hip internal rotation    Hip external rotation    Knee flexion 4- 4  Knee extension 4- 4  Ankle dorsiflexion    Ankle plantarflexion 4- 4   Goal status: INITIAL  4.  Decrease 5x STS time to 15s Baseline: 23s  Goal status: INITIAL    PLAN:  PT FREQUENCY: 1-2x/week  PT DURATION: 6 weeks  PLANNED INTERVENTIONS: Therapeutic exercises, Therapeutic  activity, Neuromuscular re-education, Balance training, Gait training, Patient/Family education, Self Care, Joint mobilization, Aquatic Therapy, Dry Needling, Electrical stimulation, Spinal mobilization, Cryotherapy, Moist heat, Manual therapy, and Re-evaluation.  PLAN FOR NEXT SESSION: HEP review and update, manual techniques as appropriate, aerobic tasks, ROM and flexibility activities, strengthening and PREs, TPDN, gait and balance training as needed     Hildred Laser, PT 08/26/2023, 5:29 PM   Check all possible CPT codes: 16109 - PT Re-evaluation, 97110- Therapeutic Exercise, [phone]- Neuro Re-education, [phone] - Gait Training, [phone] - Manual Therapy, [phone] - Therapeutic Activities, and [phone] - Self Care    Check all conditions that are expected to impact treatment: {Conditions expected to impact treatment:Complications related to surgery   If treatment provided at initial evaluation, no treatment charged due to lack of authorization.

## 2023-08-26 ENCOUNTER — Ambulatory Visit: Payer: Medicaid Other | Attending: Neurosurgery

## 2023-08-26 DIAGNOSIS — M5459 Other low back pain: Secondary | ICD-10-CM | POA: Diagnosis present

## 2023-08-26 DIAGNOSIS — M6281 Muscle weakness (generalized): Secondary | ICD-10-CM | POA: Diagnosis present

## 2023-08-26 DIAGNOSIS — M7918 Myalgia, other site: Secondary | ICD-10-CM | POA: Insufficient documentation

## 2023-09-01 ENCOUNTER — Ambulatory Visit: Payer: Medicaid Other

## 2023-09-01 DIAGNOSIS — M5459 Other low back pain: Secondary | ICD-10-CM

## 2023-09-01 DIAGNOSIS — M7918 Myalgia, other site: Secondary | ICD-10-CM | POA: Diagnosis not present

## 2023-09-01 DIAGNOSIS — M6281 Muscle weakness (generalized): Secondary | ICD-10-CM

## 2023-09-01 NOTE — Therapy (Unsigned)
OUTPATIENT PHYSICAL THERAPY TREATMENT NOTE   Patient Name: [name] MRN: [id number] DOB:11/04/1990, 32 y.o., female Today's Date: 09/01/2023  END OF SESSION:   Past Medical History:  Diagnosis Date   Abscess    Anginal pain (HCC) 12/15/2022   normal results w/ testing. Pt states she was told it was "probably a strained muscle" that was causing chest pain   Anxiety    Bipolar disorder (HCC)    Depression    Genital herpes    Migraines    "from time to time" per pt   Past Surgical History:  Procedure Laterality Date   CERVICAL DISC SURGERY     fusion   LUMBAR LAMINECTOMY/DECOMPRESSION MICRODISCECTOMY N/A 04/10/2022   Procedure: Lumbar three--four, Lumbar four-five Microdisscectomy;  Surgeon: Coletta Memos, MD;  Location: [facility] OR;  Service: Neurosurgery;  Laterality: N/A;  RM 21 3C   LUMBAR LAMINECTOMY/DECOMPRESSION MICRODISCECTOMY Right 02/04/2023   Procedure: Right Lumbar five-Sacral one Microdiscectomy;  Surgeon: Coletta Memos, MD;  Location: [facility] OR;  Service: Neurosurgery;  Laterality: Right;   MOUTH SURGERY     multiple extractions   Patient Active Problem List   Diagnosis Date Noted   HNP (herniated nucleus pulposus), lumbar 04/10/2022   SOB (shortness of breath) 05/30/2014   Other chest pain 05/30/2014   Rapid heart beat 05/30/2014   Panic attacks 05/30/2014   Anxiety 05/30/2014    [facility]P: [facility]   REFERRING PROVIDER: Coletta Memos, MD  REFERRING DIAG: M51.26 (ICD-10-CM) - Other intervertebral disc displacement, lumbar region  Rationale for Evaluation and Treatment: Rehabilitation  THERAPY DIAG:  No diagnosis found.  ONSET DATE: 01/2023  SUBJECTIVE:                                                                                                                                                                                           SUBJECTIVE STATEMENT: Saw surgeon today and will undergo another series of "steroid  injections", no further information available.Continued R lumbosacral symptoms.  PERTINENT HISTORY:  Pt is a 32 y.o. female who presented 02/04/23 for same day right L5-S1 microdiscectomy. PMH: abscess, anxiety, bipolar disorder, depression, genital herpes, migraines   PAIN:  Are you having pain? Yes: NPRS scale: 10/10 Pain location: low back Pain description: ache Aggravating factors: bending and lifting tasks Relieving factors: rest   PRECAUTIONS: Back  RED FLAGS: None   WEIGHT BEARING RESTRICTIONS: No  FALLS:  Has patient fallen in last 6 months? No  OCCUPATION: not working  PLOF: Independent  PATIENT GOALS: To manage my back symptoms  NEXT MD VISIT: November 2024  OBJECTIVE:   DIAGNOSTIC FINDINGS:  LUMBAR SPINE - 1 VIEW   COMPARISON:  04/10/2022   FINDINGS: Cross-table lateral image of the lumbar spine was obtained during an operative procedure for localization purposes. Based on previous numbering convention, there is lumbarization of the S1 vertebral body, with a rudimentary disc at the S1/S2 level. Surgical instrument is seen overlying the L5-S1 inter spinous region.   IMPRESSION: 1. Surgical instrumentation overlying the L5-S1 interspinous region.     Electronically Signed   By: Sharlet Salina M.D.   On: 02/04/2023 15:26  PATIENT SURVEYS:  FOTO 38(47 predicted) ; 08/10/23 57  SCREENING FOR RED FLAGS: Bowel or bladder incontinence: No  MUSCLE LENGTH: Hamstrings: Right 70 deg; Left 90 deg Thomas test: unremarkable  POSTURE: No Significant postural limitations  PALPATION: TTP R piriformis  LUMBAR ROM: deferred  AROM eval  Flexion   Extension   Right lateral flexion   Left lateral flexion   Right rotation   Left rotation    (Blank rows = not tested)  LOWER EXTREMITY ROM:   WFL B  Passive  Right eval Left eval  Hip flexion    Hip extension    Hip abduction    Hip adduction    Hip internal rotation    Hip external rotation     Knee flexion    Knee extension    Ankle dorsiflexion    Ankle plantarflexion    Ankle inversion    Ankle eversion     (Blank rows = not tested)  LOWER EXTREMITY MMT:    MMT Right eval Left eval  Hip flexion 4- 4  Hip extension 4- 4  Hip abduction 4- 4  Hip adduction    Hip internal rotation    Hip external rotation    Knee flexion 4- 4  Knee extension 4- 4  Ankle dorsiflexion    Ankle plantarflexion 4- 4  Ankle inversion    Ankle eversion     (Blank rows = not tested)  LUMBAR SPECIAL TESTS:  Straight leg raise test: Negative, Slump test: Negative, and piriformis  irritation present 09/05/23 + slump test on R  FUNCTIONAL TESTS:  5 times sit to stand: 23s  GAIT: Distance walked: 70ftx2 Assistive device utilized: None Level of assistance: Complete Independence Comments: slight antalgia  TODAY'S TREATMENT:      [facility] Adult PT Treatment:                                                DATE: 08/26/23 Therapeutic Exercise: Nustep L5 8 min  Plank on knees 30s x2 Plank on knees with LE extension 30s B Prone on elbows 2 min Bird dog 10/10 Supine hip fallouts BluTB 15x B, 15/15 unilaterally Bridge against BluTB 15x  S/L clams BluTB 15/15     [facility] Adult PT Treatment:                                                DATE: 08/17/23 Therapeutic Exercise: Nustep L4 8 min  Prone press 10x f/b 10x with PT OP Prone on elbows 2 min Supine hip fallouts GTB 15x B, 15/15 unilaterally Bridge against GTB 15x  S/L clams GTB 15/15 Curl ups with p-ball 15x B, 15/15 unilaterally Dead bug with p-ball  10/10  [facility] Adult PT Treatment:                                                DATE: 08/12/23 Therapeutic Exercise: Nustep L2 6 min  Supine hip fallouts GTB 15x B, 15/15 unilaterally Curl ups with p-ball 15x B, 15/15 unilaterally Manual Therapy: MET to correct anterior ilial rotation, 3 bouts Therapeutic Activity: + stork sign R, RLE longer, R ASIS depressed  [facility] Adult PT  Treatment:                                                DATE: 08/10/23 Therapeutic Exercise: Nustep L4 8 min  Prone press 10x f/b 10x with PT OP Seated sciatic floss 10x  Supine hip fallouts GTB 10x B, 10/10 unilaterally Bridge against GTB 10x  R seated hamstring stretch 30s x2 Curl ups with p-ball 15x B, 15/15 unilaterally Seated R piriformis stretch 30s x2 PKB R 30s x2 Runners step in 10/10  [facility] Adult PT Treatment:                                                DATE: 08/05/23 Therapeutic Exercise: Nustep L4 6 min  Seated sciatic floss 10x  Supine sciatic nerve floss 10x PPT 10x PPT with march 10/10 Curl ups with p-ball 10x B, 10/10 unilaterally R piriformis tennis ball release 2 min Seated R piriformis stretch 30s x2  [facility] Adult PT Treatment:                                                DATE: 08/03/23 Therapeutic Exercise: Nustep L4 6 min  Seated hamstring stretch 30s x2 B QL stretch 30s x2 Prone on elbows 2 min R piriformis stretch 30s x2 (with towel) Supine hip fallouts GTB 10x B 10/10 unilateral Clams GTB B 10/10 Manual Therapy: 2 min R piriformis release  [facility] Adult PT Treatment:                                                DATE: 07/22/23 Therapeutic Exercise: Nustep L2 6 min (R piriformis irritation limiting time) Seated hamstring stretch 30s x2 B QL stretch 30s x2 Prone on elbows 2 min Prone press 10x PPT 3s 10x PPT with alt march 10/10 R piriformis stretch 30s x2 Clams B 15/15  Manual Therapy: 2 min R piriformis release    [facility] Adult PT Treatment:                                                DATE: 07/20/23 Therapeutic Exercise: Nustep L2 6 min (R piriformis irritation limiting time) Seated hamstring stretch 30s x2 B QL stretch 30s x2 Prone  on elbows 2 min PPT 3s 10x PPT with alt march 10/10 R piriformis stretch 30s x2 Clams B 15/15 Supine Hip fallouts RTB 15x Manual Therapy: R piriformis release 2 min                                                                                                                      DATE: 07/07/23 Eval    PATIENT EDUCATION:  Education details: Discussed eval findings, rehab rationale and POC and patient is in agreement  Person educated: Patient Education method: Explanation Education comprehension: verbalized understanding and needs further education  HOME EXERCISE PROGRAM: Access Code: [email or web address] URL: [email or web address] Date: 08/17/2023 Prepared by: Gustavus Bryant  Exercises - Static Prone on Elbows  - 2 x daily - 5 x weekly - 1 sets - 1 reps - 120s hold - Supine Piriformis Stretch with Foot on Ground  - 2 x daily - 5 x weekly - 1 sets - 2 reps - 30s hold - Sit to Stand with Arms Crossed  - 2 x daily - 5 x weekly - 1 sets - 5 reps - Seated Sciatic Tensioner  - 3 x daily - 5 x weekly - 1 sets - 10 reps - Supine Sciatic Nerve Glide  - 3 x daily - 5 x weekly - 1 sets - 10 reps - Clamshell with Resistance  - 2 x daily - 5 x weekly - 1 sets - 15 reps  ASSESSMENT:  CLINICAL IMPRESSION: No distinct gait abnormality observed.  Able to advance resistance on all exercises as well as tolerate planking tasks.  Patient is a 32 y.o. female who was seen today for physical therapy evaluation and treatment for low back pain. Patient presents with strength deficits in RLE due to disuse atrophy.  Marked soft tissue irritation noted in R piriformis.  Increased 5x STS time confirms soft tissue irritation contributing to symptoms.  Core and trunk strength deficits identified as well.  No neuro tension signs elicited but R hamstring tightness noted.  OBJECTIVE IMPAIRMENTS: Abnormal gait, decreased activity tolerance, decreased endurance, decreased knowledge of condition, decreased mobility, decreased ROM, decreased strength, impaired flexibility, improper body mechanics, postural dysfunction, and pain.   ACTIVITY LIMITATIONS: carrying, lifting, bending, and standing  PARTICIPATION  LIMITATIONS: occupation  PERSONAL FACTORS: Age, Past/current experiences, and Time since onset of injury/illness/exacerbation are also affecting patient's functional outcome.   REHAB POTENTIAL: Good  CLINICAL DECISION MAKING: Stable/uncomplicated  EVALUATION COMPLEXITY: Low   GOALS: Goals reviewed with patient? No  SHORT TERM GOALS: Target date: 07/28/2023  Patient to demonstrate independence in HEP  Baseline: [email or web address] Goal status: Met  2.  Decrease R piriformis irritation from strong to moderate Baseline: Strong tenderness; 08/10/23 moderate tenderness Goal status: Met  3.  Improve R hamstring flexibility to 90d Baseline: 70d Goal status: Ongoing   LONG TERM GOALS: Target date: 08/18/2023    Patient will score at least 47% on FOTO to signify clinically meaningful improvement in functional abilities.   Baseline: 38; 08/10/23 57%  Goal status: Met  2.  4/10 pain on average Baseline: 10/10 at worst Goal status: INITIAL  3.  Increase RLE strength to 4/5 Baseline:  MMT Right eval Left eval  Hip flexion 4- 4  Hip extension 4- 4  Hip abduction 4- 4  Hip adduction    Hip internal rotation    Hip external rotation    Knee flexion 4- 4  Knee extension 4- 4  Ankle dorsiflexion    Ankle plantarflexion 4- 4   Goal status: INITIAL  4.  Decrease 5x STS time to 15s Baseline: 23s  Goal status: INITIAL    PLAN:  PT FREQUENCY: 1-2x/week  PT DURATION: 6 weeks  PLANNED INTERVENTIONS: Therapeutic exercises, Therapeutic activity, Neuromuscular re-education, Balance training, Gait training, Patient/Family education, Self Care, Joint mobilization, Aquatic Therapy, Dry Needling, Electrical stimulation, Spinal mobilization, Cryotherapy, Moist heat, Manual therapy, and Re-evaluation.  PLAN FOR NEXT SESSION: HEP review and update, manual techniques as appropriate, aerobic tasks, ROM and flexibility activities, strengthening and PREs, TPDN, gait and balance training as needed      Hildred Laser, PT 09/01/2023, 9:24 AM   Check all possible CPT codes: 16109 - PT Re-evaluation, 97110- Therapeutic Exercise, [phone]- Neuro Re-education, [phone] - Gait Training, [phone] - Manual Therapy, [phone] - Therapeutic Activities, and [phone] - Self Care    Check all conditions that are expected to impact treatment: {Conditions expected to impact treatment:Complications related to surgery   If treatment provided at initial evaluation, no treatment charged due to lack of authorization.

## 2023-09-01 NOTE — Therapy (Signed)
OUTPATIENT PHYSICAL THERAPY TREATMENT NOTE   Patient Name: [name] MRN: [id number] DOB:03-05-1991, 32 y.o., female Today's Date: 09/01/2023  END OF SESSION:  PT End of Session - 09/01/23 1134     Visit Number 10    Number of Visits 12    Date for PT Re-Evaluation 09/06/23    Authorization Type MCD    PT Start Time 1130    PT Stop Time 1210    PT Time Calculation (min) 40 min    Activity Tolerance Patient limited by pain;Patient tolerated treatment well    Behavior During Therapy [facility] for tasks assessed/performed;Anxious             Past Medical History:  Diagnosis Date   Abscess    Anginal pain (HCC) 12/15/2022   normal results w/ testing. Pt states she was told it was "probably a strained muscle" that was causing chest pain   Anxiety    Bipolar disorder (HCC)    Depression    Genital herpes    Migraines    "from time to time" per pt   Past Surgical History:  Procedure Laterality Date   CERVICAL DISC SURGERY     fusion   LUMBAR LAMINECTOMY/DECOMPRESSION MICRODISCECTOMY N/A 04/10/2022   Procedure: Lumbar three--four, Lumbar four-five Microdisscectomy;  Surgeon: Coletta Memos, MD;  Location: [facility] OR;  Service: Neurosurgery;  Laterality: N/A;  RM 21 3C   LUMBAR LAMINECTOMY/DECOMPRESSION MICRODISCECTOMY Right 02/04/2023   Procedure: Right Lumbar five-Sacral one Microdiscectomy;  Surgeon: Coletta Memos, MD;  Location: [facility] OR;  Service: Neurosurgery;  Laterality: Right;   MOUTH SURGERY     multiple extractions   Patient Active Problem List   Diagnosis Date Noted   HNP (herniated nucleus pulposus), lumbar 04/10/2022   SOB (shortness of breath) 05/30/2014   Other chest pain 05/30/2014   Rapid heart beat 05/30/2014   Panic attacks 05/30/2014   Anxiety 05/30/2014    [facility]P: [facility]   REFERRING PROVIDER: Coletta Memos, MD  REFERRING DIAG: M51.26 (ICD-10-CM) - Other intervertebral disc displacement, lumbar region  Rationale for  Evaluation and Treatment: Rehabilitation  THERAPY DIAG:  Piriformis muscle pain  Muscle weakness (generalized)  Other low back pain  ONSET DATE: 01/2023  SUBJECTIVE:                                                                                                                                                                                           SUBJECTIVE STATEMENT:  Awoke with RLE pain extending to knee but not below.    PERTINENT HISTORY:  Pt is a 32 y.o. female who presented 02/04/23 for  same day right L5-S1 microdiscectomy. PMH: abscess, anxiety, bipolar disorder, depression, genital herpes, migraines   PAIN:  Are you having pain? Yes: NPRS scale: 10/10 Pain location: low back Pain description: ache Aggravating factors: bending and lifting tasks Relieving factors: rest   PRECAUTIONS: Back  RED FLAGS: None   WEIGHT BEARING RESTRICTIONS: No  FALLS:  Has patient fallen in last 6 months? No  OCCUPATION: not working  PLOF: Independent  PATIENT GOALS: To manage my back symptoms  NEXT MD VISIT: November 2024  OBJECTIVE:   DIAGNOSTIC FINDINGS:  LUMBAR SPINE - 1 VIEW   COMPARISON:  04/10/2022   FINDINGS: Cross-table lateral image of the lumbar spine was obtained during an operative procedure for localization purposes. Based on previous numbering convention, there is lumbarization of the S1 vertebral body, with a rudimentary disc at the S1/S2 level. Surgical instrument is seen overlying the L5-S1 inter spinous region.   IMPRESSION: 1. Surgical instrumentation overlying the L5-S1 interspinous region.     Electronically Signed   By: Sharlet Salina M.D.   On: 02/04/2023 15:26  PATIENT SURVEYS:  FOTO 38(47 predicted) ; 08/10/23 57  SCREENING FOR RED FLAGS: Bowel or bladder incontinence: No  MUSCLE LENGTH: Hamstrings: Right 70 deg; Left 90 deg Thomas test: unremarkable  POSTURE: No Significant postural limitations  PALPATION: TTP R  piriformis  LUMBAR ROM: deferred  AROM eval  Flexion   Extension   Right lateral flexion   Left lateral flexion   Right rotation   Left rotation    (Blank rows = not tested)  LOWER EXTREMITY ROM:   WFL B  Passive  Right eval Left eval  Hip flexion    Hip extension    Hip abduction    Hip adduction    Hip internal rotation    Hip external rotation    Knee flexion    Knee extension    Ankle dorsiflexion    Ankle plantarflexion    Ankle inversion    Ankle eversion     (Blank rows = not tested)  LOWER EXTREMITY MMT:    MMT Right eval Left eval  Hip flexion 4- 4  Hip extension 4- 4  Hip abduction 4- 4  Hip adduction    Hip internal rotation    Hip external rotation    Knee flexion 4- 4  Knee extension 4- 4  Ankle dorsiflexion    Ankle plantarflexion 4- 4  Ankle inversion    Ankle eversion     (Blank rows = not tested)  LUMBAR SPECIAL TESTS:  Straight leg raise test: Negative, Slump test: Negative, and piriformis  irritation present 09/05/23 + slump test on R  FUNCTIONAL TESTS:  5 times sit to stand: 23s  GAIT: Distance walked: 39ftx2 Assistive device utilized: None Level of assistance: Complete Independence Comments: slight antalgia  TODAY'S TREATMENT:      [facility] Adult PT Treatment:                                                DATE: 09/01/23 Therapeutic Exercise: Nustep L5 8 min  R piriformis stretch with towel 30s x2 Plank on knees 30s x2 Plank on knees with LE extension 30s B Prone on elbows 2 min Prone press ups 10x Supine hip fallouts BlaTB 10x B, 10/10 unilaterally Bridge against BlaTB 10x  S/L clams BlaTB 10/10 Standing lateral shift(hips  R)   [facility] Adult PT Treatment:                                                DATE: 08/26/23 Therapeutic Exercise: Nustep L5 8 min  Plank on knees 30s x2 Plank on knees with LE extension 30s B Prone on elbows 2 min Bird dog 10/10 Supine hip fallouts BluTB 15x B, 15/15 unilaterally Bridge against  BluTB 15x  S/L clams BluTB 15/15     [facility] Adult PT Treatment:                                                DATE: 08/17/23 Therapeutic Exercise: Nustep L4 8 min  Prone press 10x f/b 10x with PT OP Prone on elbows 2 min Supine hip fallouts GTB 15x B, 15/15 unilaterally Bridge against GTB 15x  S/L clams GTB 15/15 Curl ups with p-ball 15x B, 15/15 unilaterally Dead bug with p-ball 10/10                                                                                                                      DATE: 07/07/23 Eval    PATIENT EDUCATION:  Education details: Discussed eval findings, rehab rationale and POC and patient is in agreement  Person educated: Patient Education method: Explanation Education comprehension: verbalized understanding and needs further education  HOME EXERCISE PROGRAM: Access Code: [email or web address] URL: [email or web address] Date: 09/01/2023 Prepared by: Gustavus Bryant  Exercises - Static Prone on Elbows  - 2 x daily - 5 x weekly - 1 sets - 1 reps - 120s hold - Clamshell with Resistance  - 2 x daily - 5 x weekly - 1 sets - 15 reps - Hooklying Single Leg Bent Knee Fallouts with Resistance  - 2 x daily - 5 x weekly - 1 sets - 15 reps - Supine Figure 4 Piriformis Stretch  - 2 x daily - 5 x weekly - 1 sets - 2 reps - 30s hold - Right Standing Lateral Shift Correction at Wall - Repetitions  - 2 x daily - 5 x weekly - 1 sets - 10 reps  ASSESSMENT:  CLINICAL IMPRESSION: SI joints cleared for dysfunction today but continued piriformis irritation present with palpation.  Continued to pursue core strengthening as well as more aggressive piriformis stretching.  Poor tolerance to palpation of R piriformis ruling out release techniques.  Added lateral glides at R L5/SI region.  Patient is a 32 y.o. female who was seen today for physical therapy evaluation and treatment for low back pain. Patient presents with strength deficits in RLE due to disuse atrophy.   Marked soft tissue irritation noted in R piriformis.  Increased 5x STS time confirms soft tissue irritation contributing to  symptoms.  Core and trunk strength deficits identified as well.  No neuro tension signs elicited but R hamstring tightness noted.  OBJECTIVE IMPAIRMENTS: Abnormal gait, decreased activity tolerance, decreased endurance, decreased knowledge of condition, decreased mobility, decreased ROM, decreased strength, impaired flexibility, improper body mechanics, postural dysfunction, and pain.   ACTIVITY LIMITATIONS: carrying, lifting, bending, and standing  PARTICIPATION LIMITATIONS: occupation  PERSONAL FACTORS: Age, Past/current experiences, and Time since onset of injury/illness/exacerbation are also affecting patient's functional outcome.   REHAB POTENTIAL: Good  CLINICAL DECISION MAKING: Stable/uncomplicated  EVALUATION COMPLEXITY: Low   GOALS: Goals reviewed with patient? No  SHORT TERM GOALS: Target date: 07/28/2023  Patient to demonstrate independence in HEP  Baseline: [id number] Goal status: Met  2.  Decrease R piriformis irritation from strong to moderate Baseline: Strong tenderness; 08/10/23 moderate tenderness Goal status: Met  3.  Improve R hamstring flexibility to 90d Baseline: 70d Goal status: Ongoing   LONG TERM GOALS: Target date: 08/18/2023    Patient will score at least 47% on FOTO to signify clinically meaningful improvement in functional abilities.   Baseline: 38; 08/10/23 57% Goal status: Met  2.  4/10 pain on average Baseline: 10/10 at worst Goal status: INITIAL  3.  Increase RLE strength to 4/5 Baseline:  MMT Right eval Left eval  Hip flexion 4- 4  Hip extension 4- 4  Hip abduction 4- 4  Hip adduction    Hip internal rotation    Hip external rotation    Knee flexion 4- 4  Knee extension 4- 4  Ankle dorsiflexion    Ankle plantarflexion 4- 4   Goal status: INITIAL  4.  Decrease 5x STS time to 15s Baseline: 23s  Goal  status: INITIAL    PLAN:  PT FREQUENCY: 1-2x/week  PT DURATION: 6 weeks  PLANNED INTERVENTIONS: Therapeutic exercises, Therapeutic activity, Neuromuscular re-education, Balance training, Gait training, Patient/Family education, Self Care, Joint mobilization, Aquatic Therapy, Dry Needling, Electrical stimulation, Spinal mobilization, Cryotherapy, Moist heat, Manual therapy, and Re-evaluation.  PLAN FOR NEXT SESSION: HEP review and update, manual techniques as appropriate, aerobic tasks, ROM and flexibility activities, strengthening and PREs, TPDN, gait and balance training as needed     Hildred Laser, PT 09/01/2023, 12:17 PM   Check all possible CPT codes: 16109 - PT Re-evaluation, 97110- Therapeutic Exercise, [phone]- Neuro Re-education, [phone] - Gait Training, [phone] - Manual Therapy, [phone] - Therapeutic Activities, and [phone] - Self Care    Check all conditions that are expected to impact treatment: {Conditions expected to impact treatment:Complications related to surgery   If treatment provided at initial evaluation, no treatment charged due to lack of authorization.

## 2023-09-02 ENCOUNTER — Ambulatory Visit: Payer: Medicaid Other

## 2023-09-02 DIAGNOSIS — M6281 Muscle weakness (generalized): Secondary | ICD-10-CM

## 2023-09-02 DIAGNOSIS — M5459 Other low back pain: Secondary | ICD-10-CM

## 2023-09-02 DIAGNOSIS — M7918 Myalgia, other site: Secondary | ICD-10-CM | POA: Diagnosis not present

## 2023-09-02 NOTE — Therapy (Signed)
OUTPATIENT PHYSICAL THERAPY TREATMENT NOTE   Patient Name: [name] MRN: [id number] DOB:1991/04/07, 32 y.o., female Today's Date: 09/02/2023  END OF SESSION:  PT End of Session - 09/02/23 1707     Visit Number 11    Number of Visits 12    Date for PT Re-Evaluation 09/06/23    Authorization Type MCD    PT Start Time 1705    PT Stop Time 1745    PT Time Calculation (min) 40 min    Activity Tolerance Patient limited by pain;Patient tolerated treatment well    Behavior During Therapy [facility] for tasks assessed/performed;Anxious             Past Medical History:  Diagnosis Date   Abscess    Anginal pain (HCC) 12/15/2022   normal results w/ testing. Pt states she was told it was "probably a strained muscle" that was causing chest pain   Anxiety    Bipolar disorder (HCC)    Depression    Genital herpes    Migraines    "from time to time" per pt   Past Surgical History:  Procedure Laterality Date   CERVICAL DISC SURGERY     fusion   LUMBAR LAMINECTOMY/DECOMPRESSION MICRODISCECTOMY N/A 04/10/2022   Procedure: Lumbar three--four, Lumbar four-five Microdisscectomy;  Surgeon: Coletta Memos, MD;  Location: [facility] OR;  Service: Neurosurgery;  Laterality: N/A;  RM 21 3C   LUMBAR LAMINECTOMY/DECOMPRESSION MICRODISCECTOMY Right 02/04/2023   Procedure: Right Lumbar five-Sacral one Microdiscectomy;  Surgeon: Coletta Memos, MD;  Location: [facility] OR;  Service: Neurosurgery;  Laterality: Right;   MOUTH SURGERY     multiple extractions   Patient Active Problem List   Diagnosis Date Noted   HNP (herniated nucleus pulposus), lumbar 04/10/2022   SOB (shortness of breath) 05/30/2014   Other chest pain 05/30/2014   Rapid heart beat 05/30/2014   Panic attacks 05/30/2014   Anxiety 05/30/2014    [facility]P: [facility]   REFERRING PROVIDER: Coletta Memos, MD  REFERRING DIAG: M51.26 (ICD-10-CM) - Other intervertebral disc displacement, lumbar region  Rationale for  Evaluation and Treatment: Rehabilitation  THERAPY DIAG:  Piriformis muscle pain  Other low back pain  Muscle weakness (generalized)  ONSET DATE: 01/2023  SUBJECTIVE:                                                                                                                                                                                           SUBJECTIVE STATEMENT:  Reports symptoms have lessened since last session despite increasing resistance and intensity of stretch.  PERTINENT HISTORY:  Pt is a 32 y.o. female who presented  02/04/23 for same day right L5-S1 microdiscectomy. PMH: abscess, anxiety, bipolar disorder, depression, genital herpes, migraines   PAIN:  Are you having pain? Yes: NPRS scale: 10/10 Pain location: low back Pain description: ache Aggravating factors: bending and lifting tasks Relieving factors: rest   PRECAUTIONS: Back  RED FLAGS: None   WEIGHT BEARING RESTRICTIONS: No  FALLS:  Has patient fallen in last 6 months? No  OCCUPATION: not working  PLOF: Independent  PATIENT GOALS: To manage my back symptoms  NEXT MD VISIT: November 2024  OBJECTIVE:   DIAGNOSTIC FINDINGS:  LUMBAR SPINE - 1 VIEW   COMPARISON:  04/10/2022   FINDINGS: Cross-table lateral image of the lumbar spine was obtained during an operative procedure for localization purposes. Based on previous numbering convention, there is lumbarization of the S1 vertebral body, with a rudimentary disc at the S1/S2 level. Surgical instrument is seen overlying the L5-S1 inter spinous region.   IMPRESSION: 1. Surgical instrumentation overlying the L5-S1 interspinous region.     Electronically Signed   By: Sharlet Salina M.D.   On: 02/04/2023 15:26  PATIENT SURVEYS:  FOTO 38(47 predicted) ; 08/10/23 57  SCREENING FOR RED FLAGS: Bowel or bladder incontinence: No  MUSCLE LENGTH: Hamstrings: Right 70 deg; Left 90 deg Thomas test: unremarkable  POSTURE: No Significant  postural limitations  PALPATION: TTP R piriformis  LUMBAR ROM: deferred  AROM eval  Flexion   Extension   Right lateral flexion   Left lateral flexion   Right rotation   Left rotation    (Blank rows = not tested)  LOWER EXTREMITY ROM:   WFL B  Passive  Right eval Left eval  Hip flexion    Hip extension    Hip abduction    Hip adduction    Hip internal rotation    Hip external rotation    Knee flexion    Knee extension    Ankle dorsiflexion    Ankle plantarflexion    Ankle inversion    Ankle eversion     (Blank rows = not tested)  LOWER EXTREMITY MMT:    MMT Right eval Left eval  Hip flexion 4- 4  Hip extension 4- 4  Hip abduction 4- 4  Hip adduction    Hip internal rotation    Hip external rotation    Knee flexion 4- 4  Knee extension 4- 4  Ankle dorsiflexion    Ankle plantarflexion 4- 4  Ankle inversion    Ankle eversion     (Blank rows = not tested)  LUMBAR SPECIAL TESTS:  Straight leg raise test: Negative, Slump test: Negative, and piriformis  irritation present 09/05/23 + slump test on R  FUNCTIONAL TESTS:  5 times sit to stand: 23s  GAIT: Distance walked: 20ftx2 Assistive device utilized: None Level of assistance: Complete Independence Comments: slight antalgia  TODAY'S TREATMENT:      [facility] Adult PT Treatment:                                                DATE: 09/02/23 Therapeutic Exercise: Nustep L6 8 min  R piriformis stretch with towel 30s x2 Plank on toes 30s x2 Plank on knees with LE extension 30s B Prone on elbows 2 min Prone press ups 10x Supine hip fallouts BlaTB 12x B, 12/12 unilaterally Bridge against BlaTB 12x  Bridge with ball squeeze S/L  clams BlaTB 12/12 Standing lateral shift(hips R)  [facility] Adult PT Treatment:                                                DATE: 09/01/23 Therapeutic Exercise: Nustep L5 8 min  R piriformis stretch with towel 30s x2 Plank on knees 30s x2 Plank on knees with LE extension 30s  B Prone on elbows 2 min Prone press ups 10x Supine hip fallouts BlaTB 10x B, 10/10 unilaterally Bridge against BlaTB 10x  S/L clams BlaTB 10/10 Standing lateral shift(hips R)   [facility] Adult PT Treatment:                                                DATE: 08/26/23 Therapeutic Exercise: Nustep L5 8 min  Plank on knees 30s x2 Plank on knees with LE extension 30s B Prone on elbows 2 min Bird dog 10/10 Supine hip fallouts BluTB 15x B, 15/15 unilaterally Bridge against BluTB 15x  S/L clams BluTB 15/15     [facility] Adult PT Treatment:                                                DATE: 08/17/23 Therapeutic Exercise: Nustep L4 8 min  Prone press 10x f/b 10x with PT OP Prone on elbows 2 min Supine hip fallouts GTB 15x B, 15/15 unilaterally Bridge against GTB 15x  S/L clams GTB 15/15 Curl ups with p-ball 15x B, 15/15 unilaterally Dead bug with p-ball 10/10                                                                                                                      DATE: 07/07/23 Eval    PATIENT EDUCATION:  Education details: Discussed eval findings, rehab rationale and POC and patient is in agreement  Person educated: Patient Education method: Explanation Education comprehension: verbalized understanding and needs further education  HOME EXERCISE PROGRAM: Access Code: [email or web address] URL: [email or web address] Date: 09/01/2023 Prepared by: Gustavus Bryant  Exercises - Static Prone on Elbows  - 2 x daily - 5 x weekly - 1 sets - 1 reps - 120s hold - Clamshell with Resistance  - 2 x daily - 5 x weekly - 1 sets - 15 reps - Hooklying Single Leg Bent Knee Fallouts with Resistance  - 2 x daily - 5 x weekly - 1 sets - 15 reps - Supine Figure 4 Piriformis Stretch  - 2 x daily - 5 x weekly - 1 sets - 2 reps - 30s hold - Right Standing Lateral Shift Correction at Wall - Repetitions  -  2 x daily - 5 x weekly - 1 sets - 10 reps  ASSESSMENT:  CLINICAL IMPRESSION: Continued  to advance strengthening and resistance tasks.  Able to tolerate increased resistance on aerobic tasks and perform plank on toes.  Incorporated bridge with ball squeeze.  Able tolerate R piriformis release  Patient is a 32 y.o. female who was seen today for physical therapy evaluation and treatment for low back pain. Patient presents with strength deficits in RLE due to disuse atrophy.  Marked soft tissue irritation noted in R piriformis.  Increased 5x STS time confirms soft tissue irritation contributing to symptoms.  Core and trunk strength deficits identified as well.  No neuro tension signs elicited but R hamstring tightness noted.  OBJECTIVE IMPAIRMENTS: Abnormal gait, decreased activity tolerance, decreased endurance, decreased knowledge of condition, decreased mobility, decreased ROM, decreased strength, impaired flexibility, improper body mechanics, postural dysfunction, and pain.   ACTIVITY LIMITATIONS: carrying, lifting, bending, and standing  PARTICIPATION LIMITATIONS: occupation  PERSONAL FACTORS: Age, Past/current experiences, and Time since onset of injury/illness/exacerbation are also affecting patient's functional outcome.   REHAB POTENTIAL: Good  CLINICAL DECISION MAKING: Stable/uncomplicated  EVALUATION COMPLEXITY: Low   GOALS: Goals reviewed with patient? No  SHORT TERM GOALS: Target date: 07/28/2023  Patient to demonstrate independence in HEP  Baseline: [id number] Goal status: Met  2.  Decrease R piriformis irritation from strong to moderate Baseline: Strong tenderness; 08/10/23 moderate tenderness Goal status: Met  3.  Improve R hamstring flexibility to 90d Baseline: 70d Goal status: Ongoing   LONG TERM GOALS: Target date: 08/18/2023    Patient will score at least 47% on FOTO to signify clinically meaningful improvement in functional abilities.   Baseline: 38; 08/10/23 57% Goal status: Met  2.  4/10 pain on average Baseline: 10/10 at worst Goal status:  INITIAL  3.  Increase RLE strength to 4/5 Baseline:  MMT Right eval Left eval  Hip flexion 4- 4  Hip extension 4- 4  Hip abduction 4- 4  Hip adduction    Hip internal rotation    Hip external rotation    Knee flexion 4- 4  Knee extension 4- 4  Ankle dorsiflexion    Ankle plantarflexion 4- 4   Goal status: INITIAL  4.  Decrease 5x STS time to 15s Baseline: 23s  Goal status: INITIAL    PLAN:  PT FREQUENCY: 1-2x/week  PT DURATION: 6 weeks  PLANNED INTERVENTIONS: Therapeutic exercises, Therapeutic activity, Neuromuscular re-education, Balance training, Gait training, Patient/Family education, Self Care, Joint mobilization, Aquatic Therapy, Dry Needling, Electrical stimulation, Spinal mobilization, Cryotherapy, Moist heat, Manual therapy, and Re-evaluation.  PLAN FOR NEXT SESSION: HEP review and update, manual techniques as appropriate, aerobic tasks, ROM and flexibility activities, strengthening and PREs, TPDN, gait and balance training as needed     Hildred Laser, PT 09/02/2023, 5:41 PM   Check all possible CPT codes: 22025 - PT Re-evaluation, 97110- Therapeutic Exercise, [phone]- Neuro Re-education, [phone] - Gait Training, [phone] - Manual Therapy, [phone] - Therapeutic Activities, and [phone] - Self Care    Check all conditions that are expected to impact treatment: {Conditions expected to impact treatment:Complications related to surgery   If treatment provided at initial evaluation, no treatment charged due to lack of authorization.

## 2023-09-06 NOTE — Therapy (Deleted)
OUTPATIENT PHYSICAL THERAPY TREATMENT NOTE   Patient Name: [name] MRN: [id number] DOB:13-Nov-1990, 32 y.o., female Today's Date: 09/06/2023  END OF SESSION:    Past Medical History:  Diagnosis Date   Abscess    Anginal pain (HCC) 12/15/2022   normal results w/ testing. Pt states she was told it was "probably a strained muscle" that was causing chest pain   Anxiety    Bipolar disorder (HCC)    Depression    Genital herpes    Migraines    "from time to time" per pt   Past Surgical History:  Procedure Laterality Date   CERVICAL DISC SURGERY     fusion   LUMBAR LAMINECTOMY/DECOMPRESSION MICRODISCECTOMY N/A 04/10/2022   Procedure: Lumbar three--four, Lumbar four-five Microdisscectomy;  Surgeon: Coletta Memos, MD;  Location: [facility] OR;  Service: Neurosurgery;  Laterality: N/A;  RM 21 3C   LUMBAR LAMINECTOMY/DECOMPRESSION MICRODISCECTOMY Right 02/04/2023   Procedure: Right Lumbar five-Sacral one Microdiscectomy;  Surgeon: Coletta Memos, MD;  Location: [facility] OR;  Service: Neurosurgery;  Laterality: Right;   MOUTH SURGERY     multiple extractions   Patient Active Problem List   Diagnosis Date Noted   HNP (herniated nucleus pulposus), lumbar 04/10/2022   SOB (shortness of breath) 05/30/2014   Other chest pain 05/30/2014   Rapid heart beat 05/30/2014   Panic attacks 05/30/2014   Anxiety 05/30/2014    [facility]P: [facility]   REFERRING PROVIDER: Coletta Memos, MD  REFERRING DIAG: M51.26 (ICD-10-CM) - Other intervertebral disc displacement, lumbar region  Rationale for Evaluation and Treatment: Rehabilitation  THERAPY DIAG:  No diagnosis found.  ONSET DATE: 01/2023  SUBJECTIVE:                                                                                                                                                                                           SUBJECTIVE STATEMENT:  Reports symptoms have lessened since last session despite increasing  resistance and intensity of stretch.  PERTINENT HISTORY:  Pt is a 32 y.o. female who presented 02/04/23 for same day right L5-S1 microdiscectomy. PMH: abscess, anxiety, bipolar disorder, depression, genital herpes, migraines   PAIN:  Are you having pain? Yes: NPRS scale: 10/10 Pain location: low back Pain description: ache Aggravating factors: bending and lifting tasks Relieving factors: rest   PRECAUTIONS: Back  RED FLAGS: None   WEIGHT BEARING RESTRICTIONS: No  FALLS:  Has patient fallen in last 6 months? No  OCCUPATION: not working  PLOF: Independent  PATIENT GOALS: To manage my back symptoms  NEXT MD VISIT: November 2024  OBJECTIVE:   DIAGNOSTIC FINDINGS:  LUMBAR SPINE -  1 VIEW   COMPARISON:  04/10/2022   FINDINGS: Cross-table lateral image of the lumbar spine was obtained during an operative procedure for localization purposes. Based on previous numbering convention, there is lumbarization of the S1 vertebral body, with a rudimentary disc at the S1/S2 level. Surgical instrument is seen overlying the L5-S1 inter spinous region.   IMPRESSION: 1. Surgical instrumentation overlying the L5-S1 interspinous region.     Electronically Signed   By: Sharlet Salina M.D.   On: 02/04/2023 15:26  PATIENT SURVEYS:  FOTO 38(47 predicted) ; 08/10/23 57  SCREENING FOR RED FLAGS: Bowel or bladder incontinence: No  MUSCLE LENGTH: Hamstrings: Right 70 deg; Left 90 deg Thomas test: unremarkable  POSTURE: No Significant postural limitations  PALPATION: TTP R piriformis  LUMBAR ROM: deferred  AROM eval  Flexion   Extension   Right lateral flexion   Left lateral flexion   Right rotation   Left rotation    (Blank rows = not tested)  LOWER EXTREMITY ROM:   WFL B  Passive  Right eval Left eval  Hip flexion    Hip extension    Hip abduction    Hip adduction    Hip internal rotation    Hip external rotation    Knee flexion    Knee extension    Ankle  dorsiflexion    Ankle plantarflexion    Ankle inversion    Ankle eversion     (Blank rows = not tested)  LOWER EXTREMITY MMT:    MMT Right eval Left eval  Hip flexion 4- 4  Hip extension 4- 4  Hip abduction 4- 4  Hip adduction    Hip internal rotation    Hip external rotation    Knee flexion 4- 4  Knee extension 4- 4  Ankle dorsiflexion    Ankle plantarflexion 4- 4  Ankle inversion    Ankle eversion     (Blank rows = not tested)  LUMBAR SPECIAL TESTS:  Straight leg raise test: Negative, Slump test: Negative, and piriformis  irritation present 09/05/23 + slump test on R  FUNCTIONAL TESTS:  5 times sit to stand: 23s  GAIT: Distance walked: 12ftx2 Assistive device utilized: None Level of assistance: Complete Independence Comments: slight antalgia  TODAY'S TREATMENT:      [facility] Adult PT Treatment:                                                DATE: 09/02/23 Therapeutic Exercise: Nustep L6 8 min  R piriformis stretch with towel 30s x2 Plank on toes 30s x2 Plank on knees with LE extension 30s B Prone on elbows 2 min Prone press ups 10x Supine hip fallouts BlaTB 12x B, 12/12 unilaterally Bridge against BlaTB 12x  Bridge with ball squeeze S/L clams BlaTB 12/12 Standing lateral shift(hips R)  [facility] Adult PT Treatment:                                                DATE: 09/01/23 Therapeutic Exercise: Nustep L5 8 min  R piriformis stretch with towel 30s x2 Plank on knees 30s x2 Plank on knees with LE extension 30s B Prone on elbows 2 min Prone press ups 10x Supine hip fallouts  BlaTB 10x B, 10/10 unilaterally Bridge against BlaTB 10x  S/L clams BlaTB 10/10 Standing lateral shift(hips R)   [facility] Adult PT Treatment:                                                DATE: 08/26/23 Therapeutic Exercise: Nustep L5 8 min  Plank on knees 30s x2 Plank on knees with LE extension 30s B Prone on elbows 2 min Bird dog 10/10 Supine hip fallouts BluTB 15x B, 15/15  unilaterally Bridge against BluTB 15x  S/L clams BluTB 15/15     [facility] Adult PT Treatment:                                                DATE: 08/17/23 Therapeutic Exercise: Nustep L4 8 min  Prone press 10x f/b 10x with PT OP Prone on elbows 2 min Supine hip fallouts GTB 15x B, 15/15 unilaterally Bridge against GTB 15x  S/L clams GTB 15/15 Curl ups with p-ball 15x B, 15/15 unilaterally Dead bug with p-ball 10/10                                                                                                                      DATE: 07/07/23 Eval    PATIENT EDUCATION:  Education details: Discussed eval findings, rehab rationale and POC and patient is in agreement  Person educated: Patient Education method: Explanation Education comprehension: verbalized understanding and needs further education  HOME EXERCISE PROGRAM: Access Code: [email or web address] URL: [email or web address] Date: 09/01/2023 Prepared by: Gustavus Bryant  Exercises - Static Prone on Elbows  - 2 x daily - 5 x weekly - 1 sets - 1 reps - 120s hold - Clamshell with Resistance  - 2 x daily - 5 x weekly - 1 sets - 15 reps - Hooklying Single Leg Bent Knee Fallouts with Resistance  - 2 x daily - 5 x weekly - 1 sets - 15 reps - Supine Figure 4 Piriformis Stretch  - 2 x daily - 5 x weekly - 1 sets - 2 reps - 30s hold - Right Standing Lateral Shift Correction at Wall - Repetitions  - 2 x daily - 5 x weekly - 1 sets - 10 reps  ASSESSMENT:  CLINICAL IMPRESSION: Continued to advance strengthening and resistance tasks.  Able to tolerate increased resistance on aerobic tasks and perform plank on toes.  Incorporated bridge with ball squeeze.  Able tolerate R piriformis release  Patient is a 32 y.o. female who was seen today for physical therapy evaluation and treatment for low back pain. Patient presents with strength deficits in RLE due to disuse atrophy.  Marked soft tissue irritation noted in R piriformis.  Increased  5x STS time confirms soft  tissue irritation contributing to symptoms.  Core and trunk strength deficits identified as well.  No neuro tension signs elicited but R hamstring tightness noted.  OBJECTIVE IMPAIRMENTS: Abnormal gait, decreased activity tolerance, decreased endurance, decreased knowledge of condition, decreased mobility, decreased ROM, decreased strength, impaired flexibility, improper body mechanics, postural dysfunction, and pain.   ACTIVITY LIMITATIONS: carrying, lifting, bending, and standing  PARTICIPATION LIMITATIONS: occupation  PERSONAL FACTORS: Age, Past/current experiences, and Time since onset of injury/illness/exacerbation are also affecting patient's functional outcome.   REHAB POTENTIAL: Good  CLINICAL DECISION MAKING: Stable/uncomplicated  EVALUATION COMPLEXITY: Low   GOALS: Goals reviewed with patient? No  SHORT TERM GOALS: Target date: 07/28/2023  Patient to demonstrate independence in HEP  Baseline: [id number] Goal status: Met  2.  Decrease R piriformis irritation from strong to moderate Baseline: Strong tenderness; 08/10/23 moderate tenderness Goal status: Met  3.  Improve R hamstring flexibility to 90d Baseline: 70d Goal status: Ongoing   LONG TERM GOALS: Target date: 08/18/2023    Patient will score at least 47% on FOTO to signify clinically meaningful improvement in functional abilities.   Baseline: 38; 08/10/23 57% Goal status: Met  2.  4/10 pain on average Baseline: 10/10 at worst Goal status: INITIAL  3.  Increase RLE strength to 4/5 Baseline:  MMT Right eval Left eval  Hip flexion 4- 4  Hip extension 4- 4  Hip abduction 4- 4  Hip adduction    Hip internal rotation    Hip external rotation    Knee flexion 4- 4  Knee extension 4- 4  Ankle dorsiflexion    Ankle plantarflexion 4- 4   Goal status: INITIAL  4.  Decrease 5x STS time to 15s Baseline: 23s  Goal status: INITIAL    PLAN:  PT FREQUENCY: 1-2x/week  PT  DURATION: 6 weeks  PLANNED INTERVENTIONS: Therapeutic exercises, Therapeutic activity, Neuromuscular re-education, Balance training, Gait training, Patient/Family education, Self Care, Joint mobilization, Aquatic Therapy, Dry Needling, Electrical stimulation, Spinal mobilization, Cryotherapy, Moist heat, Manual therapy, and Re-evaluation.  PLAN FOR NEXT SESSION: HEP review and update, manual techniques as appropriate, aerobic tasks, ROM and flexibility activities, strengthening and PREs, TPDN, gait and balance training as needed     Hildred Laser, PT 09/06/2023, 2:17 PM   Check all possible CPT codes: 08657 - PT Re-evaluation, 97110- Therapeutic Exercise, [phone]- Neuro Re-education, [phone] - Gait Training, [phone] - Manual Therapy, [phone] - Therapeutic Activities, and [phone] - Self Care    Check all conditions that are expected to impact treatment: {Conditions expected to impact treatment:Complications related to surgery   If treatment provided at initial evaluation, no treatment charged due to lack of authorization.

## 2023-09-07 ENCOUNTER — Ambulatory Visit: Payer: Medicaid Other

## 2023-09-07 NOTE — Therapy (Unsigned)
OUTPATIENT PHYSICAL THERAPY TREATMENT NOTE?DC SUMMARY   Patient Name: [name] MRN: [id number] DOB:1991/09/23, 32 y.o., female Today's Date: 09/09/2023  END OF SESSION:  PT End of Session - 09/09/23 1135     Visit Number 12    Number of Visits 12    Date for PT Re-Evaluation 09/06/23    Authorization Type MCD    PT Start Time 1135    PT Stop Time 1215    PT Time Calculation (min) 40 min    Activity Tolerance Patient limited by pain;Patient tolerated treatment well    Behavior During Therapy [facility] for tasks assessed/performed;Anxious              Past Medical History:  Diagnosis Date   Abscess    Anginal pain (HCC) 12/15/2022   normal results w/ testing. Pt states she was told it was "probably a strained muscle" that was causing chest pain   Anxiety    Bipolar disorder (HCC)    Depression    Genital herpes    Migraines    "from time to time" per pt   Past Surgical History:  Procedure Laterality Date   CERVICAL DISC SURGERY     fusion   LUMBAR LAMINECTOMY/DECOMPRESSION MICRODISCECTOMY N/A 04/10/2022   Procedure: Lumbar three--four, Lumbar four-five Microdisscectomy;  Surgeon: Coletta Memos, MD;  Location: [facility] OR;  Service: Neurosurgery;  Laterality: N/A;  RM 21 3C   LUMBAR LAMINECTOMY/DECOMPRESSION MICRODISCECTOMY Right 02/04/2023   Procedure: Right Lumbar five-Sacral one Microdiscectomy;  Surgeon: Coletta Memos, MD;  Location: [facility] OR;  Service: Neurosurgery;  Laterality: Right;   MOUTH SURGERY     multiple extractions   Patient Active Problem List   Diagnosis Date Noted   HNP (herniated nucleus pulposus), lumbar 04/10/2022   SOB (shortness of breath) 05/30/2014   Other chest pain 05/30/2014   Rapid heart beat 05/30/2014   Panic attacks 05/30/2014   Anxiety 05/30/2014    [facility]   REFERRING PROVIDER: Coletta Memos, MD  REFERRING DIAG: M51.26 (ICD-10-CM) - Other intervertebral disc displacement, lumbar  region  Rationale for Evaluation and Treatment: Rehabilitation  THERAPY DIAG:  Piriformis muscle pain  Other low back pain  Muscle weakness (generalized)  ONSET DATE: 01/2023  SUBJECTIVE:                                                                                                                                                                                           SUBJECTIVE STATEMENT:  Mild symptom increase as she is on her cycle.  Confident to DC today pending spinal injections next week.  Baseline pain levels 6/10.  PERTINENT HISTORY:  Pt is a 32 y.o. female who presented 02/04/23 for same day right L5-S1 microdiscectomy. PMH: abscess, anxiety, bipolar disorder, depression, genital herpes, migraines   PAIN:  Are you having pain? Yes: NPRS scale: 10/10 Pain location: low back Pain description: ache Aggravating factors: bending and lifting tasks Relieving factors: rest   PRECAUTIONS: Back  RED FLAGS: None   WEIGHT BEARING RESTRICTIONS: No  FALLS:  Has patient fallen in last 6 months? No  OCCUPATION: not working  PLOF: Independent  PATIENT GOALS: To manage my back symptoms  NEXT MD VISIT: November 2024  OBJECTIVE:   DIAGNOSTIC FINDINGS:  LUMBAR SPINE - 1 VIEW   COMPARISON:  04/10/2022   FINDINGS: Cross-table lateral image of the lumbar spine was obtained during an operative procedure for localization purposes. Based on previous numbering convention, there is lumbarization of the S1 vertebral body, with a rudimentary disc at the S1/S2 level. Surgical instrument is seen overlying the L5-S1 inter spinous region.   IMPRESSION: 1. Surgical instrumentation overlying the L5-S1 interspinous region.     Electronically Signed   By: Sharlet Salina M.D.   On: 02/04/2023 15:26  PATIENT SURVEYS:  FOTO 38(47 predicted) ; 08/10/23 57  SCREENING FOR RED FLAGS: Bowel or bladder incontinence: No  MUSCLE LENGTH: Hamstrings: Right 70 deg; Left 90 deg;  09/09/23 90d B Thomas test: unremarkable  POSTURE: No Significant postural limitations  PALPATION: TTP R piriformis  LUMBAR ROM: deferred  AROM eval  Flexion   Extension   Right lateral flexion   Left lateral flexion   Right rotation   Left rotation    (Blank rows = not tested)  LOWER EXTREMITY ROM:   WFL B  Passive  Right eval Left eval  Hip flexion    Hip extension    Hip abduction    Hip adduction    Hip internal rotation    Hip external rotation    Knee flexion    Knee extension    Ankle dorsiflexion    Ankle plantarflexion    Ankle inversion    Ankle eversion     (Blank rows = not tested)  LOWER EXTREMITY MMT:    MMT Right eval Left eval R 09/09/23  Hip flexion 4- 4 4  Hip extension 4- 4 4  Hip abduction 4- 4 4  Hip adduction     Hip internal rotation     Hip external rotation     Knee flexion 4- 4 4  Knee extension 4- 4 4  Ankle dorsiflexion     Ankle plantarflexion 4- 4 4  Ankle inversion     Ankle eversion      (Blank rows = not tested)  LUMBAR SPECIAL TESTS:  Straight leg raise test: Negative, Slump test: Negative, and piriformis  irritation present 09/05/23 + slump test on R  FUNCTIONAL TESTS:  5 times sit to stand: 23s  09/09/23 10s  GAIT: Distance walked: 40ftx2 Assistive device utilized: None Level of assistance: Complete Independence Comments: slight antalgia  TODAY'S TREATMENT:      [facility] Adult PT Treatment:                                                DATE: 09/09/23 Therapeutic Exercise: Nustep L6 8 min  R piriformis stretch with towel 30s x2 Plank on toes 30s  Prone press ups 10x  Supine hip fallouts BlaTB 15x B, 15/15 unilaterally Bridge against BlaTB 15x   S/L clams BlaTB 15/15  [facility] Adult PT Treatment:                                                DATE: 09/02/23 Therapeutic Exercise: Nustep L6 8 min  R piriformis stretch with towel 30s x2 Plank on toes 30s x2 Plank on knees with LE extension 30s B Prone on  elbows 2 min Prone press ups 10x Supine hip fallouts BlaTB 12x B, 12/12 unilaterally Bridge against BlaTB 12x  Bridge with ball squeeze S/L clams BlaTB 12/12 Standing lateral shift(hips R)  [facility] Adult PT Treatment:                                                DATE: 09/01/23 Therapeutic Exercise: Nustep L5 8 min  R piriformis stretch with towel 30s x2 Plank on knees 30s x2 Plank on knees with LE extension 30s B Prone on elbows 2 min Prone press ups 10x Supine hip fallouts BlaTB 10x B, 10/10 unilaterally Bridge against BlaTB 10x  S/L clams BlaTB 10/10 Standing lateral shift(hips R)   [facility] Adult PT Treatment:                                                DATE: 08/26/23 Therapeutic Exercise: Nustep L5 8 min  Plank on knees 30s x2 Plank on knees with LE extension 30s B Prone on elbows 2 min Bird dog 10/10 Supine hip fallouts BluTB 15x B, 15/15 unilaterally Bridge against BluTB 15x  S/L clams BluTB 15/15     [facility] Adult PT Treatment:                                                DATE: 08/17/23 Therapeutic Exercise: Nustep L4 8 min  Prone press 10x f/b 10x with PT OP Prone on elbows 2 min Supine hip fallouts GTB 15x B, 15/15 unilaterally Bridge against GTB 15x  S/L clams GTB 15/15 Curl ups with p-ball 15x B, 15/15 unilaterally Dead bug with p-ball 10/10                                                                                                                      DATE: 07/07/23 Eval    PATIENT EDUCATION:  Education details: Discussed eval findings, rehab rationale and POC and patient is in agreement  Person educated: Patient Education method: Explanation Education  comprehension: verbalized understanding and needs further education  HOME EXERCISE PROGRAM: Access Code: [email or web address] URL: [email or web address] Date: 09/09/2023 Prepared by: Gustavus Bryant  Exercises - Clamshell with Resistance  - 2 x daily - 5 x weekly - 1 sets - 15 reps -  Hooklying Single Leg Bent Knee Fallouts with Resistance  - 2 x daily - 5 x weekly - 1 sets - 15 reps - Supine Figure 4 Piriformis Stretch  - 2 x daily - 5 x weekly - 1 sets - 2 reps - 30s hold - Right Standing Lateral Shift Correction at Wall - Repetitions  - 2 x daily - 5 x weekly - 1 sets - 10 reps - Prone Press Up  - 1 x daily - 5 x weekly - 3 sets - 10 reps - 30s hold - Plank with Hip Extension  - 1 x daily - 5 x weekly - 1 sets - 2 reps - 30s hold  ASSESSMENT:  CLINICAL IMPRESSION: Goals met or maximum function regained.  Scheduled for spinal injections next week.  Patient is a 32 y.o. female who was seen today for physical therapy evaluation and treatment for low back pain. Patient presents with strength deficits in RLE due to disuse atrophy.  Marked soft tissue irritation noted in R piriformis.  Increased 5x STS time confirms soft tissue irritation contributing to symptoms.  Core and trunk strength deficits identified as well.  No neuro tension signs elicited but R hamstring tightness noted.  OBJECTIVE IMPAIRMENTS: Abnormal gait, decreased activity tolerance, decreased endurance, decreased knowledge of condition, decreased mobility, decreased ROM, decreased strength, impaired flexibility, improper body mechanics, postural dysfunction, and pain.   ACTIVITY LIMITATIONS: carrying, lifting, bending, and standing  PARTICIPATION LIMITATIONS: occupation  PERSONAL FACTORS: Age, Past/current experiences, and Time since onset of injury/illness/exacerbation are also affecting patient's functional outcome.   REHAB POTENTIAL: Good  CLINICAL DECISION MAKING: Stable/uncomplicated  EVALUATION COMPLEXITY: Low   GOALS: Goals reviewed with patient? No  SHORT TERM GOALS: Target date: 07/28/2023  Patient to demonstrate independence in HEP  Baseline: [email or web address] Goal status: Met  2.  Decrease R piriformis irritation from strong to moderate Baseline: Strong tenderness; 08/10/23 moderate  tenderness Goal status: Met  3.  Improve R hamstring flexibility to 90d Baseline: 70d; 09/09/23 90d B Goal status: Met   LONG TERM GOALS: Target date: 09/18/2023    Patient will score at least 47% on FOTO to signify clinically meaningful improvement in functional abilities.   Baseline: 38; 08/10/23 57% Goal status: Met  2.  4/10 pain on average Baseline: 10/10 at worst; 09/09/23 6/10 at best Goal status: Partially met  3.  Increase RLE strength to 4/5 Baseline:  MMT Right eval Left eval R 09/09/23  Hip flexion 4- 4 4  Hip extension 4- 4 4  Hip abduction 4- 4 4  Hip adduction     Hip internal rotation     Hip external rotation     Knee flexion 4- 4 4  Knee extension 4- 4 4  Ankle dorsiflexion     Ankle plantarflexion 4- 4 4   Goal status: INITIAL  4.  Decrease 5x STS time to 15s Baseline: 23s; 09/09/23 10s Goal status: Met    PLAN:  PT FREQUENCY: 1-2x/week  PT DURATION: 6 weeks  PLANNED INTERVENTIONS: Therapeutic exercises, Therapeutic activity, Neuromuscular re-education, Balance training, Gait training, Patient/Family education, Self Care, Joint mobilization, Aquatic Therapy, Dry Needling, Electrical stimulation, Spinal mobilization, Cryotherapy, Moist heat, Manual therapy, and Re-evaluation.  PLAN FOR NEXT SESSION:  HEP review and update, manual techniques as appropriate, aerobic tasks, ROM and flexibility activities, strengthening and PREs, TPDN, gait and balance training as needed     Hildred Laser, PT 09/09/2023, 12:09 PM   Check all possible CPT codes: 53664 - PT Re-evaluation, 97110- Therapeutic Exercise, [phone]- Neuro Re-education, [phone] - Gait Training, [phone] - Manual Therapy, [phone] - Therapeutic Activities, and [phone] - Self Care    Check all conditions that are expected to impact treatment: {Conditions expected to impact treatment:Complications related to surgery   If treatment provided at initial evaluation, no treatment charged due to lack  of authorization.

## 2023-09-09 ENCOUNTER — Ambulatory Visit: Payer: Medicaid Other

## 2023-09-09 DIAGNOSIS — M7918 Myalgia, other site: Secondary | ICD-10-CM | POA: Diagnosis not present

## 2023-09-09 DIAGNOSIS — M6281 Muscle weakness (generalized): Secondary | ICD-10-CM

## 2023-09-09 DIAGNOSIS — M5459 Other low back pain: Secondary | ICD-10-CM

## 2023-11-03 ENCOUNTER — Other Ambulatory Visit: Payer: Self-pay | Admitting: Neurosurgery

## 2023-11-04 ENCOUNTER — Other Ambulatory Visit (HOSPITAL_COMMUNITY): Payer: Self-pay | Admitting: Neurosurgery

## 2023-11-04 DIAGNOSIS — M5126 Other intervertebral disc displacement, lumbar region: Secondary | ICD-10-CM

## 2023-11-11 NOTE — Progress Notes (Deleted)
Office Visit Note  Patient: [name]             Date of Birth: [birth year]           MRN: [id number]             PCP: [facility] Referring: Debroah Baller,* Visit Date: 11/12/2023 Occupation: @GUAROCC @  Subjective:  No chief complaint on file.   History of Present Illness: [name] is a 33 y.o. female ***     Activities of Daily Living:  Patient reports morning stiffness for *** {minute/hour:19697}.   Patient {ACTIONS;DENIES/REPORTS:21021675::"Denies"} nocturnal pain.  Difficulty dressing/grooming: {ACTIONS;DENIES/REPORTS:21021675::"Denies"} Difficulty climbing stairs: {ACTIONS;DENIES/REPORTS:21021675::"Denies"} Difficulty getting out of chair: {ACTIONS;DENIES/REPORTS:21021675::"Denies"} Difficulty using hands for taps, buttons, cutlery, and/or writing: {ACTIONS;DENIES/REPORTS:21021675::"Denies"}  No Rheumatology ROS completed.   PMFS History:  Patient Active Problem List   Diagnosis Date Noted   HNP (herniated nucleus pulposus), lumbar 04/10/2022   SOB (shortness of breath) 05/30/2014   Other chest pain 05/30/2014   Rapid heart beat 05/30/2014   Panic attacks 05/30/2014   Anxiety 05/30/2014    Past Medical History:  Diagnosis Date   Abscess    Anginal pain (HCC) 12/15/2022   normal results w/ testing. Pt states she was told it was "probably a strained muscle" that was causing chest pain   Anxiety    Bipolar disorder (HCC)    Depression    Genital herpes    Migraines    "from time to time" per pt    Family History  Problem Relation Age of Onset   Hyperlipidemia Mother    Hypertension Mother    Hyperlipidemia Father    Past Surgical History:  Procedure Laterality Date   CERVICAL DISC SURGERY     fusion   LUMBAR LAMINECTOMY/DECOMPRESSION MICRODISCECTOMY N/A 04/10/2022   Procedure: Lumbar three--four, Lumbar four-five Microdisscectomy;  Surgeon: Coletta Memos, MD;  Location: [facility] OR;  Service: Neurosurgery;   Laterality: N/A;  RM 21 3C   LUMBAR LAMINECTOMY/DECOMPRESSION MICRODISCECTOMY Right 02/04/2023   Procedure: Right Lumbar five-Sacral one Microdiscectomy;  Surgeon: Coletta Memos, MD;  Location: [facility] OR;  Service: Neurosurgery;  Laterality: Right;   MOUTH SURGERY     multiple extractions   Social History   Social History Narrative   Home with fiancee. Daughter 41 years old    There is no immunization history on file for this patient.   Objective: Vital Signs: There were no vitals taken for this visit.   Physical Exam   Musculoskeletal Exam: ***  CDAI Exam: CDAI Score: -- Patient Global: --; Provider Global: -- Swollen: --; Tender: -- Joint Exam 11/12/2023   No joint exam has been documented for this visit   There is currently no information documented on the homunculus. Go to the Rheumatology activity and complete the homunculus joint exam.  Investigation: No additional findings.  Imaging: No results found.  Recent Labs: Lab Results  Component Value Date   WBC 8.4 01/29/2023   HGB 13.0 01/29/2023   PLT 306 01/29/2023   NA 139 01/29/2023   K 3.6 01/29/2023   CL 104 01/29/2023   CO2 28 01/29/2023   GLUCOSE 76 01/29/2023   BUN 6 01/29/2023   CREATININE 0.85 01/29/2023   BILITOT 0.5 11/02/2018   ALKPHOS 68 11/02/2018   AST 21 11/02/2018   ALT 21 11/02/2018   PROT 7.3 11/02/2018   ALBUMIN 4.0 11/02/2018   CALCIUM 9.0 01/29/2023   GFRAA >60 11/02/2018    Speciality Comments: No  specialty comments available.  Procedures:  No procedures performed Allergies: Fentanyl, Aluminum, and Nickel   Assessment / Plan:     Visit Diagnoses: No diagnosis found.  Orders: No orders of the defined types were placed in this encounter.  No orders of the defined types were placed in this encounter.   Face-to-face time spent with patient was *** minutes. Greater than 50% of time was spent in counseling and coordination of care.  Follow-Up Instructions: No follow-ups on  file.   Fuller Plan, MD  Note - This record has been created using [employer].  Chart creation errors have been sought, but may not always  have been located. Such creation errors do not reflect on  the standard of medical care.

## 2023-11-12 ENCOUNTER — Encounter: Payer: Medicaid Other | Admitting: Internal Medicine

## 2023-11-15 ENCOUNTER — Encounter: Payer: Self-pay | Admitting: Emergency Medicine

## 2023-11-15 ENCOUNTER — Ambulatory Visit: Payer: Medicaid Other | Admitting: Emergency Medicine

## 2023-11-15 VITALS — BP 114/80 | HR 115 | Temp 98.2°F | Ht 63.0 in | Wt 184.0 lb

## 2023-11-15 DIAGNOSIS — F319 Bipolar disorder, unspecified: Secondary | ICD-10-CM | POA: Insufficient documentation

## 2023-11-15 DIAGNOSIS — M545 Low back pain, unspecified: Secondary | ICD-10-CM | POA: Diagnosis not present

## 2023-11-15 DIAGNOSIS — M5126 Other intervertebral disc displacement, lumbar region: Secondary | ICD-10-CM | POA: Diagnosis not present

## 2023-11-15 DIAGNOSIS — G8929 Other chronic pain: Secondary | ICD-10-CM

## 2023-11-15 DIAGNOSIS — Z7689 Persons encountering health services in other specified circumstances: Secondary | ICD-10-CM

## 2023-11-15 DIAGNOSIS — R000 Tachycardia, unspecified: Secondary | ICD-10-CM

## 2023-11-15 DIAGNOSIS — L309 Dermatitis, unspecified: Secondary | ICD-10-CM

## 2023-11-15 DIAGNOSIS — F3170 Bipolar disorder, currently in remission, most recent episode unspecified: Secondary | ICD-10-CM | POA: Diagnosis not present

## 2023-11-15 DIAGNOSIS — E6609 Other obesity due to excess calories: Secondary | ICD-10-CM

## 2023-11-15 DIAGNOSIS — Z6832 Body mass index (BMI) 32.0-32.9, adult: Secondary | ICD-10-CM

## 2023-11-15 DIAGNOSIS — E66811 Obesity, class 1: Secondary | ICD-10-CM

## 2023-11-15 LAB — COMPREHENSIVE METABOLIC PANEL
ALT: 24 U/L (ref 0–35)
AST: 19 U/L (ref 0–37)
Albumin: 4.5 g/dL (ref 3.5–5.2)
Alkaline Phosphatase: 59 U/L (ref 39–117)
BUN: 12 mg/dL (ref 6–23)
CO2: 29 meq/L (ref 19–32)
Calcium: 9.2 mg/dL (ref 8.4–10.5)
Chloride: 101 meq/L (ref 96–112)
Creatinine, Ser: 0.93 mg/dL (ref 0.40–1.20)
GFR: 81.77 mL/min (ref >60.00)
Glucose, Bld: 95 mg/dL (ref 70–99)
Potassium: 3.8 meq/L (ref 3.5–5.1)
Sodium: 139 meq/L (ref 135–145)
Total Bilirubin: 0.4 mg/dL (ref 0.2–1.2)
Total Protein: 7.2 g/dL (ref 6.0–8.3)

## 2023-11-15 LAB — CBC WITH DIFFERENTIAL/PLATELET
Basophils Absolute: 0.1 10*3/uL (ref 0.0–0.1)
Basophils Relative: 0.9 % (ref 0.0–3.0)
Eosinophils Absolute: 0.2 10*3/uL (ref 0.0–0.7)
Eosinophils Relative: 1.8 % (ref 0.0–5.0)
HCT: 41.8 % (ref 36.0–46.0)
Hemoglobin: 14.3 g/dL (ref 12.0–15.0)
Lymphocytes Relative: 27.2 % (ref 12.0–46.0)
Lymphs Abs: 2.3 10*3/uL (ref 0.7–4.0)
MCHC: 34.1 g/dL (ref 30.0–36.0)
MCV: 91.3 fL (ref 78.0–100.0)
Monocytes Absolute: 0.5 10*3/uL (ref 0.1–1.0)
Monocytes Relative: 5.3 % (ref 3.0–12.0)
Neutro Abs: 5.6 10*3/uL (ref 1.4–7.7)
Neutrophils Relative %: 64.8 % (ref 43.0–77.0)
Platelets: 334 10*3/uL (ref 150.0–400.0)
RBC: 4.58 Mil/uL (ref 3.87–5.11)
RDW: 12.5 % (ref 11.5–15.5)
WBC: 8.6 10*3/uL (ref 4.0–10.5)

## 2023-11-15 LAB — TSH: TSH: 2.87 u[IU]/mL (ref 0.35–5.50)

## 2023-11-15 LAB — LIPID PANEL
Cholesterol: 235 mg/dL — ABNORMAL HIGH (ref 0–200)
HDL: 64.3 mg/dL (ref >39.00)
LDL Cholesterol: 146 mg/dL — ABNORMAL HIGH (ref 0–99)
NonHDL: 171.05
Total CHOL/HDL Ratio: 4
Triglycerides: 124 mg/dL (ref 0.0–149.0)
VLDL: 24.8 mg/dL (ref 0.0–40.0)

## 2023-11-15 LAB — HEMOGLOBIN A1C: Hgb A1c MFr Bld: 5.1 % (ref 4.6–6.5)

## 2023-11-15 NOTE — Assessment & Plan Note (Signed)
Stable.  Takes Seroquel 400 mg daily at bedtime Sees psychiatrist on a regular basis Also history of chronic generalized anxiety and occasional panic attacks Takes propranolol as needed

## 2023-11-15 NOTE — Assessment & Plan Note (Signed)
Creating chronic low back pain Status post 2 lumbar surgeries in the past Sees spinal surgeon on a regular basis

## 2023-11-15 NOTE — Assessment & Plan Note (Signed)
Active and intermittent affecting quality of life Secondary to lumbar spine disease Sees spine surgeon on a regular basis Chronic pain handle by their office Made patient aware I will not be handling chronic pain management for her.

## 2023-11-15 NOTE — Progress Notes (Signed)
[place] 33 y.o.   Chief Complaint  Patient presents with   Establish Care    Establish care. Patient states its been years since being seen and had bad experiences with previous clinics     HISTORY OF PRESENT ILLNESS: This is a 33 y.o. female first visit to this office, here to establish care with me. Patient has the following chronic medical problems: 1.  Chronic back pain.  Status post 2 lumbar spine surgeries within the last 2 years.  Sees spinal surgeon on a regular basis Pain management handled by their office.  States she is not currently taking any opiates. 2.  History of bipolar depression.  Sees psychiatrist on a regular basis.  On Seroquel.  Also on propranolol as needed for panic attacks 3.  Chronic rash to right lower leg.  Eczema.  Also has wart on right thumb. 4.  Requesting referral to gynecologist  HPI   Prior to Admission medications   Medication Sig Start Date End Date Taking? Authorizing Provider  diazepam (VALIUM) 5 MG tablet Take 1 tablet (5 mg total) by mouth every 6 (six) hours as needed for muscle spasms. 04/11/22   Jadene Pierini, MD  HYDROcodone-acetaminophen (NORCO/VICODIN) 5-325 MG tablet Take 1 tablet by mouth 2 (two) times daily. 01/08/23   [provider]  naproxen (NAPROSYN) 500 MG tablet Take 1 tablet (500 mg total) by mouth 2 (two) times daily. Patient not taking: Reported on 01/29/2023 12/15/22   Darrick Grinder, PA-C  QUEtiapine (SEROQUEL XR) 400 MG 24 hr tablet Take 1 tablet (400 mg total) by mouth at bedtime. 10/13/16 01/29/23  Nira Conn, MD    Allergies  Allergen Reactions   Fentanyl Nausea And Vomiting   Aluminum Rash   Nickel Rash    Patient Active Problem List   Diagnosis Date Noted   HNP (herniated nucleus pulposus), lumbar 04/10/2022   SOB (shortness of breath) 05/30/2014   Other chest pain 05/30/2014   Rapid heart beat 05/30/2014   Panic attacks 05/30/2014   Anxiety 05/30/2014    Past Medical  History:  Diagnosis Date   Abscess    Anginal pain (HCC) 12/15/2022   normal results w/ testing. Pt states she was told it was "probably a strained muscle" that was causing chest pain   Anxiety    Bipolar disorder (HCC)    Depression    Genital herpes    Migraines    "from time to time" per pt    Past Surgical History:  Procedure Laterality Date   CERVICAL DISC SURGERY     fusion   LUMBAR LAMINECTOMY/DECOMPRESSION MICRODISCECTOMY N/A 04/10/2022   Procedure: Lumbar three--four, Lumbar four-five Microdisscectomy;  Surgeon: Coletta Memos, MD;  Location: [facility] OR;  Service: Neurosurgery;  Laterality: N/A;  RM 21 3C   LUMBAR LAMINECTOMY/DECOMPRESSION MICRODISCECTOMY Right 02/04/2023   Procedure: Right Lumbar five-Sacral one Microdiscectomy;  Surgeon: Coletta Memos, MD;  Location: [facility] OR;  Service: Neurosurgery;  Laterality: Right;   MOUTH SURGERY     multiple extractions    Social History   Socioeconomic History   Marital status: Significant Other    Spouse name: Not on file   Number of children: 1   Years of education: Not on file   Highest education level: Not on file  Occupational History   Not on file  Tobacco Use   Smoking status: Never   Smokeless tobacco: Never  Vaping Use   Vaping status: Every Day  Substance and Sexual Activity  Alcohol use: Not Currently   Drug use: Yes    Types: Marijuana    Comment: 1-2 times per day   Sexual activity: Yes    Birth control/protection: Pill  Other Topics Concern   Not on file  Social History Narrative   Home with fiancee. Daughter 60 years old   Social Drivers of Corporate investment banker Strain: Not on file  Food Insecurity: Not on file  Transportation Needs: Not on file  Physical Activity: Not on file  Stress: Not on file  Social Connections: Not on file  Intimate Partner Violence: Not on file    Family History  Problem Relation Age of Onset   Hyperlipidemia Mother    Hypertension Mother    Hyperlipidemia  Father      Review of Systems  Constitutional: Negative.  Negative for chills and fever.  HENT: Negative.  Negative for congestion and sore throat.   Respiratory: Negative.  Negative for cough.   Cardiovascular: Negative.  Negative for chest pain and palpitations.  Gastrointestinal:  Negative for abdominal pain, nausea and vomiting.  Musculoskeletal:  Positive for back pain.  Skin:  Positive for rash.  Neurological: Negative.  Negative for dizziness and headaches.  All other systems reviewed and are negative.   Today's Vitals   11/15/23 1021  BP: 114/80  Pulse: (!) 115  Temp: 98.2 F (36.8 C)  TempSrc: Oral  SpO2: 96%  Weight: 184 lb (83.5 kg)  Height: 5\' 3"  (1.6 m)   Body mass index is 32.59 kg/m.   Physical Exam Vitals reviewed.  Constitutional:      Appearance: Normal appearance.  HENT:     Head: Normocephalic.     Mouth/Throat:     Mouth: Mucous membranes are moist.     Pharynx: Oropharynx is clear.  Eyes:     Extraocular Movements: Extraocular movements intact.     Conjunctiva/sclera: Conjunctivae normal.     Pupils: Pupils are equal, round, and reactive to light.  Cardiovascular:     Rate and Rhythm: Normal rate and regular rhythm.     Pulses: Normal pulses.     Heart sounds: Normal heart sounds.  Pulmonary:     Effort: Pulmonary effort is normal.     Breath sounds: Normal breath sounds.  Abdominal:     Palpations: Abdomen is soft.     Tenderness: There is no abdominal tenderness.  Musculoskeletal:        General: Normal range of motion.     Cervical back: No tenderness.  Lymphadenopathy:     Cervical: No cervical adenopathy.  Skin:    Capillary Refill: Capillary refill takes less than 2 seconds.     Findings: Rash present.     Comments: Eczematous rash to right lower leg  Neurological:     General: No focal deficit present.     Mental Status: She is alert and oriented to person, place, and time.  Psychiatric:        Mood and Affect: Mood  normal.        Behavior: Behavior normal.      ASSESSMENT & PLAN: A total of 48 minutes was spent with the patient and counseling/coordination of care regarding preparing for this visit, review of available medical records, establishing care with me, comprehensive history and physical examination, review of multiple chronic medical conditions, review of all medications, review of health maintenance items, chronic pain management, education on nutrition, prognosis, documentation, and need for follow-up.  Problem List Items Addressed  This Visit       Musculoskeletal and Integument   HNP (herniated nucleus pulposus), lumbar   Creating chronic low back pain Status post 2 lumbar surgeries in the past Sees spinal surgeon on a regular basis      Relevant Orders   CBC with Differential/Platelet   Comprehensive metabolic panel   Hemoglobin A1c   Lipid panel   TSH     Other   Sinus tachycardia   Has history of rapid heartbeats related to anxiety Asymptomatic and stable Occasionally takes propranolol      Relevant Orders   CBC with Differential/Platelet   Comprehensive metabolic panel   Hemoglobin A1c   Lipid panel   TSH   Bipolar disorder (HCC)   Stable.  Takes Seroquel 400 mg daily at bedtime Sees psychiatrist on a regular basis Also history of chronic generalized anxiety and occasional panic attacks Takes propranolol as needed      Relevant Orders   CBC with Differential/Platelet   Comprehensive metabolic panel   Hemoglobin A1c   Lipid panel   TSH   Chronic bilateral low back pain without sciatica - Primary   Active and intermittent affecting quality of life Secondary to lumbar spine disease Sees spine surgeon on a regular basis Chronic pain handle by their office Made patient aware I will not be handling chronic pain management for her.      Relevant Medications   clonazePAM (KLONOPIN) 1 MG tablet   Other Relevant Orders   CBC with Differential/Platelet    Comprehensive metabolic panel   Hemoglobin A1c   Lipid panel   TSH   Other Visit Diagnoses       Encounter to establish care       Relevant Orders   Ambulatory referral to Gynecology   CBC with Differential/Platelet   Comprehensive metabolic panel   Hemoglobin A1c   Lipid panel   TSH     Chronic eczema       Relevant Orders   Ambulatory referral to Dermatology      Patient Instructions  Health Maintenance, Female Adopting a healthy lifestyle and getting preventive care are important in promoting health and wellness. Ask your health care provider about: The right schedule for you to have regular tests and exams. Things you can do on your own to prevent diseases and keep yourself healthy. What should I know about diet, weight, and exercise? Eat a healthy diet  Eat a diet that includes plenty of vegetables, fruits, low-fat dairy products, and lean protein. Do not eat a lot of foods that are high in solid fats, added sugars, or sodium. Maintain a healthy weight Body mass index (BMI) is used to identify weight problems. It estimates body fat based on height and weight. Your health care provider can help determine your BMI and help you achieve or maintain a healthy weight. Get regular exercise Get regular exercise. This is one of the most important things you can do for your health. Most adults should: Exercise for at least 150 minutes each week. The exercise should increase your heart rate and make you sweat (moderate-intensity exercise). Do strengthening exercises at least twice a week. This is in addition to the moderate-intensity exercise. Spend less time sitting. Even light physical activity can be beneficial. Watch cholesterol and blood lipids Have your blood tested for lipids and cholesterol at 32 years of age, then have this test every 5 years. Have your cholesterol levels checked more often if: Your lipid or cholesterol levels  are high. You are older than 32 years of  age. You are at high risk for heart disease. What should I know about cancer screening? Depending on your health history and family history, you may need to have cancer screening at various ages. This may include screening for: Breast cancer. Cervical cancer. Colorectal cancer. Skin cancer. Lung cancer. What should I know about heart disease, diabetes, and high blood pressure? Blood pressure and heart disease High blood pressure causes heart disease and increases the risk of stroke. This is more likely to develop in people who have high blood pressure readings or are overweight. Have your blood pressure checked: Every 3-5 years if you are 29-46 years of age. Every year if you are 38 years old or older. Diabetes Have regular diabetes screenings. This checks your fasting blood sugar level. Have the screening done: Once every three years after age 30 if you are at a normal weight and have a low risk for diabetes. More often and at a younger age if you are overweight or have a high risk for diabetes. What should I know about preventing infection? Hepatitis B If you have a higher risk for hepatitis B, you should be screened for this virus. Talk with your health care provider to find out if you are at risk for hepatitis B infection. Hepatitis C Testing is recommended for: Everyone born from [birth year] through 1964. Anyone with known risk factors for hepatitis C. Sexually transmitted infections (STIs) Get screened for STIs, including gonorrhea and chlamydia, if: You are sexually active and are younger than 32 years of age. You are older than 32 years of age and your health care provider tells you that you are at risk for this type of infection. Your sexual activity has changed since you were last screened, and you are at increased risk for chlamydia or gonorrhea. Ask your health care provider if you are at risk. Ask your health care provider about whether you are at high risk for HIV. Your health  care provider may recommend a prescription medicine to help prevent HIV infection. If you choose to take medicine to prevent HIV, you should first get tested for HIV. You should then be tested every 3 months for as long as you are taking the medicine. Pregnancy If you are about to stop having your period (premenopausal) and you may become pregnant, seek counseling before you get pregnant. Take 400 to 800 micrograms (mcg) of folic acid every day if you become pregnant. Ask for birth control (contraception) if you want to prevent pregnancy. Osteoporosis and menopause Osteoporosis is a disease in which the bones lose minerals and strength with aging. This can result in bone fractures. If you are 30 years old or older, or if you are at risk for osteoporosis and fractures, ask your health care provider if you should: Be screened for bone loss. Take a calcium or vitamin D supplement to lower your risk of fractures. Be given hormone replacement therapy (HRT) to treat symptoms of menopause. Follow these instructions at home: Alcohol use Do not drink alcohol if: Your health care provider tells you not to drink. You are pregnant, may be pregnant, or are planning to become pregnant. If you drink alcohol: Limit how much you have to: 0-1 drink a day. Know how much alcohol is in your drink. In the [place] one drink equals one 12 oz bottle of beer (355 mL), one 5 oz glass of wine (148 mL), or one 1 oz glass of hard  liquor (44 mL). Lifestyle Do not use any products that contain nicotine or tobacco. These products include cigarettes, chewing tobacco, and vaping devices, such as e-cigarettes. If you need help quitting, ask your health care provider. Do not use street drugs. Do not share needles. Ask your health care provider for help if you need support or information about quitting drugs. General instructions Schedule regular health, dental, and eye exams. Stay current with your vaccines. Tell your health  care provider if: You often feel depressed. You have ever been abused or do not feel safe at home. Summary Adopting a healthy lifestyle and getting preventive care are important in promoting health and wellness. Follow your health care provider's instructions about healthy diet, exercising, and getting tested or screened for diseases. Follow your health care provider's instructions on monitoring your cholesterol and blood pressure. This information is not intended to replace advice given to you by your health care provider. Make sure you discuss any questions you have with your health care provider. Document Revised: 02/24/2021 Document Reviewed: 02/24/2021 Elsevier Patient Education  2024 Elsevier Inc.    Edwina Barth, MD [facility]

## 2023-11-15 NOTE — Assessment & Plan Note (Signed)
Has history of rapid heartbeats related to anxiety Asymptomatic and stable Occasionally takes propranolol

## 2023-11-15 NOTE — Patient Instructions (Signed)

## 2023-11-16 NOTE — Progress Notes (Signed)
Okay to refer?

## 2023-11-19 ENCOUNTER — Other Ambulatory Visit: Payer: Self-pay | Admitting: Radiology

## 2023-11-23 ENCOUNTER — Other Ambulatory Visit: Payer: Self-pay | Admitting: Radiology

## 2023-11-23 DIAGNOSIS — Z7689 Persons encountering health services in other specified circumstances: Secondary | ICD-10-CM

## 2023-11-24 ENCOUNTER — Other Ambulatory Visit (HOSPITAL_COMMUNITY): Payer: Medicaid Other

## 2023-11-24 ENCOUNTER — Ambulatory Visit (HOSPITAL_COMMUNITY): Admission: RE | Admit: 2023-11-24 | Payer: Medicaid Other | Source: Ambulatory Visit

## 2023-11-24 ENCOUNTER — Encounter (HOSPITAL_COMMUNITY): Payer: Self-pay

## 2023-11-24 DIAGNOSIS — E6609 Other obesity due to excess calories: Secondary | ICD-10-CM | POA: Insufficient documentation

## 2023-12-07 ENCOUNTER — Other Ambulatory Visit: Payer: Self-pay | Admitting: Neurosurgery

## 2023-12-17 ENCOUNTER — Other Ambulatory Visit: Payer: Self-pay

## 2023-12-17 ENCOUNTER — Ambulatory Visit (HOSPITAL_COMMUNITY): Admission: RE | Admit: 2023-12-17 | Payer: Medicaid Other | Source: Ambulatory Visit

## 2023-12-17 ENCOUNTER — Ambulatory Visit (HOSPITAL_COMMUNITY)
Admission: RE | Admit: 2023-12-17 | Discharge: 2023-12-17 | Disposition: A | Discharge status: Home / Self Care | Payer: Medicaid Other | Source: Ambulatory Visit | Attending: Neurosurgery | Admitting: Neurosurgery

## 2023-12-17 DIAGNOSIS — M5126 Other intervertebral disc displacement, lumbar region: Secondary | ICD-10-CM | POA: Diagnosis present

## 2023-12-17 LAB — PREGNANCY, URINE: Preg Test, Ur: NEGATIVE

## 2023-12-17 MED ORDER — DIAZEPAM 5 MG PO TABS
10.0000 mg | ORAL_TABLET | Freq: Once | ORAL | Status: DC
Start: 2023-12-17 — End: 2023-12-18

## 2023-12-17 MED ORDER — LIDOCAINE HCL (PF) 1 % IJ SOLN: 5.0000 mL | Freq: Once | INTRAMUSCULAR | Status: DC

## 2023-12-17 MED ORDER — IOHEXOL 180 MG/ML  SOLN
20.0000 mL | Freq: Once | INTRAMUSCULAR | Status: DC | PRN
Start: 2023-12-17 — End: 2023-12-18

## 2023-12-17 NOTE — Progress Notes (Signed)
 Client states last time had spinal injection had more pain after procedure and started having right hip pain; states having anxiety and has prescription for clonazepam here, advised to not take clonazepam prior to procedure because has valium ordered; Dr Franky Macho notified and will be in to see client

## 2023-12-29 ENCOUNTER — Other Ambulatory Visit (HOSPITAL_COMMUNITY): Payer: Self-pay | Admitting: Neurosurgery

## 2023-12-29 ENCOUNTER — Other Ambulatory Visit: Payer: Self-pay | Admitting: Neurosurgery

## 2023-12-29 DIAGNOSIS — M5126 Other intervertebral disc displacement, lumbar region: Secondary | ICD-10-CM

## 2024-01-11 ENCOUNTER — Ambulatory Visit (INDEPENDENT_AMBULATORY_CARE_PROVIDER_SITE_OTHER): Payer: Self-pay | Admitting: Nurse Practitioner

## 2024-01-11 ENCOUNTER — Encounter: Payer: Self-pay | Admitting: Nurse Practitioner

## 2024-01-11 ENCOUNTER — Other Ambulatory Visit (HOSPITAL_COMMUNITY)
Admission: RE | Admit: 2024-01-11 | Discharge: 2024-01-11 | Disposition: A | Discharge status: Home / Self Care | Source: Ambulatory Visit | Attending: Nurse Practitioner | Admitting: Nurse Practitioner

## 2024-01-11 VITALS — BP 128/86 | HR 98 | Ht 63.0 in | Wt 181.0 lb

## 2024-01-11 DIAGNOSIS — N921 Excessive and frequent menstruation with irregular cycle: Secondary | ICD-10-CM | POA: Diagnosis not present

## 2024-01-11 DIAGNOSIS — Z124 Encounter for screening for malignant neoplasm of cervix: Secondary | ICD-10-CM | POA: Diagnosis present

## 2024-01-11 DIAGNOSIS — N946 Dysmenorrhea, unspecified: Secondary | ICD-10-CM

## 2024-01-11 DIAGNOSIS — Z01419 Encounter for gynecological examination (general) (routine) without abnormal findings: Secondary | ICD-10-CM | POA: Diagnosis not present

## 2024-01-11 DIAGNOSIS — L680 Hirsutism: Secondary | ICD-10-CM

## 2024-01-11 NOTE — Progress Notes (Signed)
 [name] Jun 08, 1991 [id number]   History:  33 y.o. G1P1001 presents as new patient to establish care. Complains of history of heavy painful periods since menarche. Have been irregular since having daughter in 2013. Skips months and sometimes has 2 cycles per month. Reports hair along chin, jaw line and breasts, difficulty with weight loss. H/O migraines, HSV, depression, bipolar disorder. Normal TSH, A1c in January. Father with prediabetes. Boyfriend present during visit.   Gynecologic History Patient's last menstrual period was 01/05/2024. Period Duration (Days): 3-7 Period Pattern: (!) Irregular Menstrual Flow: Heavy, Light Dysmenorrhea: (!) Moderate Contraception/Family planning: vasectomy Sexually active: Yes  Health Maintenance Last Pap: UNK Last mammogram: Not indicated Last colonoscopy: Not indicated Last Dexa: Not indicated      11/15/2023   10:37 AM  Depression screen PHQ 2/9  Decreased Interest 0  Down, Depressed, Hopeless 0  PHQ - 2 Score 0     Past medical history, past surgical history, family history and social history were all reviewed and documented in the EPIC chart. Boyfriend. 32 yo daughter.   ROS:  A ROS was performed and pertinent positives and negatives are included.  Exam:  Vitals:   01/11/24 1105  BP: 128/86  Pulse: 98  SpO2: 98%  Weight: 181 lb (82.1 kg)  Height: 5\' 3"  (1.6 m)   Body mass index is 32.06 kg/m.  General appearance:  Normal Thyroid:  Symmetrical, normal in size, without palpable masses or nodularity. Respiratory  Auscultation:  Clear without wheezing or rhonchi Cardiovascular  Auscultation:  Regular rate, without rubs, murmurs or gallops  Edema/varicosities:  Not grossly evident Abdominal  Soft,nontender, without masses, guarding or rebound.  Liver/spleen:  No organomegaly noted  Hernia:  None appreciated  Skin  Inspection:  Grossly normal Breasts: Examined lying and sitting.   Right: Without masses,  retractions, nipple discharge or axillary adenopathy.   Left: Without masses, retractions, nipple discharge or axillary adenopathy. Pelvic: External genitalia:  no lesions              Urethra:  normal appearing urethra with no masses, tenderness or lesions              Bartholins and Skenes: normal                 Vagina: normal appearing vagina with normal color and discharge, no lesions              Cervix: no lesions Bimanual Exam:  Uterus:  no masses or tenderness              Adnexa: no mass, fullness, tenderness              Rectovaginal: Deferred              Anus:  normal, no lesions  Patient informed chaperone available to be present for breast and pelvic exam. Patient has requested no chaperone to be present. Patient has been advised what will be completed during breast and pelvic exam.   Assessment/Plan:  33 y.o. G1P1001 to establish care.   Well female exam with routine gynecological exam - Education provided on SBEs, importance of preventative screenings, current guidelines, high calcium diet, regular exercise, and multivitamin daily. Labs with PCP.   Cervical cancer screening - Plan: Cytology - PAP( [facility]). Normal pap history.   Menorrhagia with irregular cycle - Plan: Estradiol, Follicle stimulating hormone, Luteinizing hormone. PCOS workup. If labs are normal, ultrasound recommended.   Dysmenorrhea - Recommend Ibuprofen 800  mg every 8 hours as needed for cramping.   Hirsutism - Plan: Testos,Total,Free and SHBG (Female)  Follow up to be determined.     [name], 11:48 AM 01/11/2024

## 2024-01-12 ENCOUNTER — Ambulatory Visit (HOSPITAL_COMMUNITY): Admission: RE | Admit: 2024-01-12 | Source: Ambulatory Visit

## 2024-01-12 ENCOUNTER — Ambulatory Visit (HOSPITAL_COMMUNITY)
Admission: RE | Admit: 2024-01-12 | Discharge: 2024-01-12 | Disposition: A | Discharge status: Home / Self Care | Source: Ambulatory Visit | Attending: Neurosurgery | Admitting: Neurosurgery

## 2024-01-12 ENCOUNTER — Encounter (HOSPITAL_COMMUNITY): Payer: Self-pay

## 2024-01-12 ENCOUNTER — Other Ambulatory Visit: Payer: Self-pay

## 2024-01-12 DIAGNOSIS — M5126 Other intervertebral disc displacement, lumbar region: Secondary | ICD-10-CM | POA: Insufficient documentation

## 2024-01-12 LAB — PREGNANCY, URINE: Preg Test, Ur: NEGATIVE

## 2024-01-12 LAB — CYTOLOGY - PAP
Comment: NEGATIVE
Diagnosis: NEGATIVE
High risk HPV: NEGATIVE

## 2024-01-12 MED ORDER — DIAZEPAM 5 MG PO TABS: 10.0000 mg | ORAL_TABLET | Freq: Once | ORAL | Status: DC

## 2024-01-12 MED ORDER — ONDANSETRON HCL 4 MG/2ML IJ SOLN: 4.0000 mg | Freq: Four times a day (QID) | INTRAMUSCULAR | Status: DC | PRN

## 2024-01-12 MED ORDER — OXYCODONE-ACETAMINOPHEN 5-325 MG PO TABS: 1.0000 | ORAL_TABLET | ORAL | Status: DC | PRN

## 2024-01-12 NOTE — Progress Notes (Signed)
 Dr Franky Macho notified of client c/o 5/10 chest tightness and heart rate 115 and he will be in to see client

## 2024-01-17 LAB — TESTOS,TOTAL,FREE AND SHBG (FEMALE)
Free Testosterone: 6.6 pg/mL — ABNORMAL HIGH (ref 0.1–6.4)
Sex Hormone Binding: 34.7 nmol/L (ref 17–124)
Testosterone, Total, LC-MS-MS: 55 ng/dL — ABNORMAL HIGH (ref 2–45)

## 2024-01-17 LAB — ESTRADIOL: Estradiol: 167 pg/mL

## 2024-01-17 LAB — FOLLICLE STIMULATING HORMONE: FSH: 6.4 m[IU]/mL

## 2024-01-17 LAB — LUTEINIZING HORMONE: LH: 24.9 m[IU]/mL

## 2024-01-18 ENCOUNTER — Encounter: Payer: Self-pay | Admitting: Dietician

## 2024-01-18 ENCOUNTER — Encounter: Payer: Medicaid Other | Attending: Emergency Medicine | Admitting: Dietician

## 2024-01-18 VITALS — Wt 187.5 lb

## 2024-01-18 DIAGNOSIS — Z6833 Body mass index (BMI) 33.0-33.9, adult: Secondary | ICD-10-CM | POA: Insufficient documentation

## 2024-01-18 DIAGNOSIS — E66811 Obesity, class 1: Secondary | ICD-10-CM | POA: Insufficient documentation

## 2024-01-18 NOTE — Telephone Encounter (Signed)
 Per appt desk: "LM with patient appointment is 01/27/24 at 2:00 to arrive @1 :45 with Tiffany."   Encounter closed.

## 2024-01-18 NOTE — Patient Instructions (Signed)
 Goals Established by Pt  Goal 1: have a meatless meal (beans for protein) at least one night per week.   Goal 2: try 2 new foods between now and next visit.

## 2024-01-18 NOTE — Progress Notes (Signed)
 Medical Nutrition Therapy  Appointment Start time:  [phone]  Appointment End time:  1217  Primary concerns today: healthy eating   Referral diagnosis: E66.9 Preferred learning style: no preference indicated Learning readiness: ready   NUTRITION ASSESSMENT   Anthropometrics   Wt: 187.5 lb  Clinical Medical Hx: anxiety, depression, hyperlipidemia, IBS Medications: reviewed Labs: 11/15/23: chol 235, LDL 146,  Notable Signs/Symptoms: GI discomfort Food Allergies: none reported  Lifestyle & Dietary Hx  Pt states she hasn't been the healthiest growing up, considering herself a picky eater, and struggles with trying new foods. Pt reports over the last few years she has lost interest in meat. Pt states she started becoming aware of dieting when she was 33 years old, and got bullied in middle/high school about weight. Pt reports in high school one day she ate a chicken sandwich and got stomach issues, and ever since then she has dealt with stomach pain. Pt reports during that time she went from 150 to 115 lb unintentionally due to stomach pain. Pt states she feels she has body dysmorphia. Pt reports she was never diagnosed with an eating disorder but considered that she may have struggled with one.   Pt reports 2 weeks ago she started a diet by reducing her processed snacks. Pt states prior to this she would skip breakfast and have a lot of snacks such as candy, smores, chips, etc. Pt reports now having 3 meals per day with some snacks, and aiming to incorporate more healthy snacks.   Pt states she has had 2 spinal surgeries in her lower back which causes her to deal with chronic pain.   Pt states she tries to walk for 1 hour daily.  Pt lives with her fiance and daughter.   Estimated daily fluid intake: 32 oz Supplements: none reported Sleep: 6 hours.  Stress / self-care: high stress  Current average weekly physical activity: 1 hour walk 4 days per week.   24-Hr Dietary Recall First Meal:  whole wheat english muffin and egg and 1/2 with peanut butter and orange juice Snack: none Second Meal: chicken wrap with lettuce and cheese Snack: tangerine or apple Third Meal: meat, with veggie and starch Snack: ice cream with mug brownie OR graham crackers Beverages: occasional orange juice, 2 bottles water, 2-3 sprite per week.    NUTRITION DIAGNOSIS  NB-1.1 Food and nutrition-related knowledge deficit As related to lack of prior education by a registered dietitian.  As evidenced by pt report.   NUTRITION INTERVENTION  Nutrition education (E-1) on the following topics:   Plate Method Fruits & Vegetables: Aim to fill half your plate with a variety of fruits and vegetables. They are rich in vitamins, minerals, and fiber, and can help reduce the risk of chronic diseases. Choose a colorful assortment of fruits and vegetables to ensure you get a wide range of nutrients. Grains and Starches: Make at least half of your grain choices whole grains, such as brown rice, whole wheat bread, and oats. Whole grains provide fiber, which aids in digestion and healthy cholesterol levels. Aim for whole forms of starchy vegetables such as potatoes, sweet potatoes, beans, peas, and corn, which are fiber rich and provide many vitamins and minerals.  Protein: Incorporate lean sources of protein, such as poultry, fish, beans, nuts, and seeds, into your meals. Protein is essential for building and repairing tissues, staying full, balancing blood sugar, as well as supporting immune function. Dairy: Include low-fat or fat-free dairy products like milk, yogurt, and cheese  in your diet. Dairy foods are excellent sources of calcium and vitamin D, which are crucial for bone health.   Cholesterol Nutrition Education Soluble fiber and impact on lowering cholesterol: Sources: Oats, barley, beans, lentils, fruits (apples, oranges), vegetables (carrots, broccoli), and flaxseeds. Benefits: Soluble fiber reduces the  absorption of cholesterol into your bloodstream. Choose Healthy Unsaturated Fats and Omega 3's. Sources: Olive oil, canola oil, avocados, nuts, and Fatty fish (salmon, trout), walnuts, flaxseeds, and chia seeds. Benefits: Helps raise HDL cholesterol and lower LDL cholesterol. Omega-3s help reduce triglycerides and raise HDL cholesterol. Limit Saturated Fats: Sources: Red meat, full-fat dairy products, butter, and coconut oil. Benefits: Reducing intake helps lower LDL cholesterol levels. Eat Plenty of Fruits and Vegetables. Benefits: High in fiber and antioxidants, which help lower LDL cholesterol. Eat Whole Grains. Sources: Brown rice, whole wheat bread, quinoa, and whole grain pasta. Benefits: Whole grains contain more fiber and nutrients than refined grains.   Handouts Provided Include  Plate Method  Learning Style & Readiness for Change Teaching method utilized: Visual & Auditory  Demonstrated degree of understanding via: Teach Back  Barriers to learning/adherence to lifestyle change: none  Goals Established by Pt  Goal 1: have a meatless meal (beans for protein) at least one night per week.   Goal 2: try 2 new foods between now and next visit.   MONITORING & EVALUATION Dietary intake, weekly physical activity, and follow up in 6 weeks.  Next Steps  Patient is to call for questions.

## 2024-01-19 ENCOUNTER — Encounter: Payer: Self-pay | Admitting: Emergency Medicine

## 2024-01-19 ENCOUNTER — Ambulatory Visit (INDEPENDENT_AMBULATORY_CARE_PROVIDER_SITE_OTHER): Admitting: Emergency Medicine

## 2024-01-19 VITALS — BP 122/84 | HR 107 | Temp 98.7°F | Ht 63.0 in | Wt 188.0 lb

## 2024-01-19 DIAGNOSIS — F410 Panic disorder [episodic paroxysmal anxiety] without agoraphobia: Secondary | ICD-10-CM

## 2024-01-19 DIAGNOSIS — R42 Dizziness and giddiness: Secondary | ICD-10-CM | POA: Insufficient documentation

## 2024-01-19 DIAGNOSIS — F3170 Bipolar disorder, currently in remission, most recent episode unspecified: Secondary | ICD-10-CM

## 2024-01-19 DIAGNOSIS — R202 Paresthesia of skin: Secondary | ICD-10-CM | POA: Diagnosis not present

## 2024-01-19 DIAGNOSIS — F419 Anxiety disorder, unspecified: Secondary | ICD-10-CM | POA: Diagnosis not present

## 2024-01-19 DIAGNOSIS — F458 Other somatoform disorders: Secondary | ICD-10-CM | POA: Insufficient documentation

## 2024-01-19 NOTE — Progress Notes (Signed)
 [name] Setting 32 y.o.   Chief Complaint  Patient presents with   Numbness    Pt states 2 weeks ago she went for a walk and had an episode where she felt like her head couldn't catch up with her body, slow motion with some dizziness. She had another episode where she was driving and had felt pin and needles all over her body. She had another episode where she couldn't breath through her nose, couldn't move and numbness. She had anxiety/panic attacks for year and never had  this happen before. She states her family has a history of strokes and wants to get evaluated for potts.    HISTORY OF PRESENT ILLNESS: This is a 33 y.o. female who had episode about 2 weeks ago while she was walking and suddenly felt her head spinning and feeling weird, like her head could not catch up with her body, like in slow motion with some dizziness.  On the same day later while driving she had an episode of tachycardia and hyperventilation with numbness throughout her body and a feeling of pins-and-needles everywhere.  Her jaw locked up and could barely move.  Has history of generalized anxiety disorder and bipolar disorder Has had panic attacks in the past.  EMT was called to the scene but not taking to emergency department.  She elected to go home. Mother told her there is a family history of strokes and advised her to be checked by her doctor. No new medications.  No new supplements.  However she has been taking Hydroxycut prior to workouts. No other complaints or medical concerns today.  HPI   Prior to Admission medications   Medication Sig Start Date End Date Taking? Authorizing Provider  clonazePAM (KLONOPIN) 1 MG tablet Take 1-2 mg by mouth daily as needed. 11/03/23  Yes [provider]  oxycodone (OXY-IR) 5 MG capsule Take 5 mg by mouth every 4 (four) hours as needed.   Yes [provider]  propranolol (INDERAL) 10 MG tablet Take 10 mg by mouth as needed. 08/13/23  Yes [provider]  diazepam (VALIUM) 2 MG tablet Take 5 mg by mouth every 6 (six) hours as needed for anxiety. Patient not taking: Reported on 01/19/2024    [provider]  QUEtiapine (SEROQUEL XR) 400 MG 24 hr tablet Take 1 tablet (400 mg total) by mouth at bedtime. 10/13/16 01/12/24  Nira Conn, MD    Allergies  Allergen Reactions   Fentanyl Nausea And Vomiting   Aluminum Rash   Nickel Rash    Patient Active Problem List   Diagnosis Date Noted   Class 1 obesity due to excess calories without serious comorbidity with body mass index (BMI) of 32.0 to 32.9 in adult 11/24/2023   Chronic bilateral low back pain without sciatica 11/15/2023   Bipolar disorder (HCC)    HNP (herniated nucleus pulposus), lumbar 04/10/2022   Sinus tachycardia 05/30/2014   Panic attacks 05/30/2014   Anxiety 05/30/2014    Past Medical History:  Diagnosis Date   Abscess    Anginal pain (HCC) 12/15/2022   normal results w/ testing. Pt states she was told it was "probably a strained muscle" that was causing chest pain   Anxiety    Bipolar disorder (HCC)    Depression    Genital herpes    Migraines    "from time to time" per pt    Past Surgical History:  Procedure Laterality Date   CERVICAL DISC SURGERY  fusion   LUMBAR LAMINECTOMY/DECOMPRESSION MICRODISCECTOMY N/A 04/10/2022   Procedure: Lumbar three--four, Lumbar four-five Microdisscectomy;  Surgeon: Coletta Memos, MD;  Location: [facility] OR;  Service: Neurosurgery;  Laterality: N/A;  RM 21 3C   LUMBAR LAMINECTOMY/DECOMPRESSION MICRODISCECTOMY Right 02/04/2023   Procedure: Right Lumbar five-Sacral one Microdiscectomy;  Surgeon: Coletta Memos, MD;  Location: [facility] OR;  Service: Neurosurgery;  Laterality: Right;   MOUTH SURGERY     multiple extractions    Social History   Socioeconomic History   Marital status: Single    Spouse name: Stephens November   Number of children: 1   Years of education: Not on file   Highest education level:  Some college, no degree  Occupational History   Not on file  Tobacco Use   Smoking status: Never   Smokeless tobacco: Never  Vaping Use   Vaping status: Every Day   Substances: THC   Devices: use for the pain  Substance and Sexual Activity   Alcohol use: Not Currently   Drug use: Yes    Types: Marijuana    Comment: 1-2 times per day   Sexual activity: Yes    Birth control/protection: Other-see comments    Comment: vasectomy  Other Topics Concern   Not on file  Social History Narrative   Home with fiancee. Daughter 95 years old   Social Drivers of Corporate investment banker Strain: Not on file  Food Insecurity: No Food Insecurity (01/18/2024)   Hunger Vital Sign    Worried About Running Out of Food in the Last Year: Never true    Ran Out of Food in the Last Year: Never true  Transportation Needs: Not on file  Physical Activity: Not on file  Stress: Not on file  Social Connections: Not on file  Intimate Partner Violence: Not on file    Family History  Problem Relation Age of Onset   Depression Mother    Hyperlipidemia Mother    Hypertension Mother    Depression Father    Hypertension Father    Hyperlipidemia Father    Breast cancer Maternal Grandmother    Alzheimer's disease Maternal Grandmother      Review of Systems  Constitutional: Negative.  Negative for chills and fever.  HENT: Negative.  Negative for congestion and sore throat.   Respiratory: Negative.  Negative for cough and shortness of breath.   Cardiovascular:  Positive for palpitations. Negative for chest pain.  Gastrointestinal:  Negative for abdominal pain, diarrhea, nausea and vomiting.  Genitourinary: Negative.  Negative for dysuria and hematuria.  Musculoskeletal: Negative.   Skin: Negative.  Negative for rash.  Neurological:  Positive for dizziness, tingling, sensory change, weakness and headaches. Negative for speech change, focal weakness, seizures and loss of consciousness.  All other  systems reviewed and are negative.   Vitals:   01/19/24 1059  BP: 122/84  Pulse: (!) 107  Temp: 98.7 F (37.1 C)  SpO2: 96%    Physical Exam Vitals reviewed.  Constitutional:      Appearance: Normal appearance.  HENT:     Head: Normocephalic.     Right Ear: Tympanic membrane, ear canal and external ear normal.     Left Ear: Tympanic membrane, ear canal and external ear normal.     Mouth/Throat:     Mouth: Mucous membranes are moist.     Pharynx: Oropharynx is clear.  Eyes:     Extraocular Movements: Extraocular movements intact.     Conjunctiva/sclera: Conjunctivae normal.  Pupils: Pupils are equal, round, and reactive to light.     Funduscopic exam:    Right eye: No hemorrhage or papilledema.        Left eye: No hemorrhage or papilledema.  Cardiovascular:     Rate and Rhythm: Normal rate and regular rhythm.     Pulses: Normal pulses.     Heart sounds: Normal heart sounds.  Pulmonary:     Effort: Pulmonary effort is normal.     Breath sounds: Normal breath sounds.  Abdominal:     Palpations: Abdomen is soft.     Tenderness: There is no abdominal tenderness.  Musculoskeletal:     Cervical back: No tenderness.  Lymphadenopathy:     Cervical: No cervical adenopathy.  Skin:    General: Skin is warm and dry.  Neurological:     General: No focal deficit present.     Mental Status: She is alert and oriented to person, place, and time.     Cranial Nerves: No cranial nerve deficit.     Sensory: No sensory deficit.     Motor: No weakness.     Coordination: Coordination normal.     Gait: Gait normal.  Psychiatric:        Mood and Affect: Mood normal.        Behavior: Behavior normal.      ASSESSMENT & PLAN: A total of 42 minutes was spent with the patient and counseling/coordination of care regarding preparing for this visit, review of most recent office visit notes, review of multiple chronic medical conditions and their management, review of all medications,  review of most recent bloodwork results, review of health maintenance items, education on nutrition, prognosis, documentation, and need for follow up.   Problem List Items Addressed This Visit       Respiratory   Hyperventilation syndrome   Typical clinical picture. Unknown trigger. Recommend follow-up with psychiatrist/psychologist Referral placed today      Relevant Orders   Ambulatory referral to Neurology     Other   Panic attacks   Becoming more frequent and debilitating Needs follow-up with behavioral health      Relevant Orders   Ambulatory referral to Psychology   Anxiety   Long history of anxiety per the patient. Recommended she follow up with psychiatry.  Referral placed today      Relevant Orders   Ambulatory referral to Psychology   Bipolar disorder (HCC)   Stable.  Takes Seroquel 400 mg daily at bedtime Sees psychiatrist on a regular basis Also history of chronic generalized anxiety and occasional panic attacks Takes propranolol as needed      Relevant Orders   Ambulatory referral to Psychology   Paresthesia - Primary   Most likely secondary to hyperventilation syndrome Chronic and debilitating Recommend neurology evaluation Referral placed today      Relevant Orders   Ambulatory referral to Neurology   Head spinning   Nonspecific symptom Family history of strokes Recommend neurology evaluation Referral placed today      Relevant Orders   Ambulatory referral to Neurology   Patient Instructions  Hyperventilation Hyperventilation is a condition that happens when you breathe faster and more deeply than usual. An episode of hyperventilation usually lasts 20-30 minutes. During an episode, you may feel breathless, and your hands, feet, or mouth may tingle, spasm, or feel numb. Hyperventilation is usually triggered by stress, anxiety, or emotions. However, it can also be a sign of another condition, such as: A lung problem, such  as chronic  obstructive pulmonary disease, emphysema, or asthma. An infection. Heart problems. Pregnancy. Bleeding. Head injury. Use of stimulants or certain drug overdoses. High altitude. Follow these instructions at home:  Learn and use deep breathing exercises that help you breathe from your diaphragm and abdomen. Practice relaxation techniques to reduce stress, such as visualization, meditation, or gentle yoga. If you are hyperventilating, breathe into a paper bag. This slows down breathing. Contact a health care provider if: You continue to have episodes of hyperventilation. Your hyperventilation gets worse. Get help right away if: Someone witnesses you passing out during hyperventilation. You have continued numbness after a period of hyperventilation. You have chest pain, tightness, fullness, or pressure. These symptoms may be an emergency. Get help right away. Call 911. Do not wait to see if the symptoms will go away. Do not drive yourself to the hospital. Summary Hyperventilation is breathing more deeply and more rapidly than normal. During an episode, you may feel breathless, and your hands, feet, or mouth may tingle, spasm, or feel numb. If you are hyperventilating, try breathing into a paper bag. This slows down breathing. This information is not intended to replace advice given to you by your health care provider. Make sure you discuss any questions you have with your health care provider. Document Revised: 05/06/2021 Document Reviewed: 05/06/2021 Elsevier Patient Education  2024 Elsevier Inc.     Edwina Barth, MD  Primary Care at [facility]

## 2024-01-19 NOTE — Assessment & Plan Note (Signed)
 Typical clinical picture. Unknown trigger. Recommend follow-up with psychiatrist/psychologist Referral placed today

## 2024-01-19 NOTE — Assessment & Plan Note (Signed)
 Nonspecific symptom Family history of strokes Recommend neurology evaluation Referral placed today

## 2024-01-19 NOTE — Patient Instructions (Signed)
 Hyperventilation Hyperventilation is a condition that happens when you breathe faster and more deeply than usual. An episode of hyperventilation usually lasts 20-30 minutes. During an episode, you may feel breathless, and your hands, feet, or mouth may tingle, spasm, or feel numb. Hyperventilation is usually triggered by stress, anxiety, or emotions. However, it can also be a sign of another condition, such as: A lung problem, such as chronic obstructive pulmonary disease, emphysema, or asthma. An infection. Heart problems. Pregnancy. Bleeding. Head injury. Use of stimulants or certain drug overdoses. High altitude. Follow these instructions at home:  Learn and use deep breathing exercises that help you breathe from your diaphragm and abdomen. Practice relaxation techniques to reduce stress, such as visualization, meditation, or gentle yoga. If you are hyperventilating, breathe into a paper bag. This slows down breathing. Contact a health care provider if: You continue to have episodes of hyperventilation. Your hyperventilation gets worse. Get help right away if: Someone witnesses you passing out during hyperventilation. You have continued numbness after a period of hyperventilation. You have chest pain, tightness, fullness, or pressure. These symptoms may be an emergency. Get help right away. Call 911. Do not wait to see if the symptoms will go away. Do not drive yourself to the hospital. Summary Hyperventilation is breathing more deeply and more rapidly than normal. During an episode, you may feel breathless, and your hands, feet, or mouth may tingle, spasm, or feel numb. If you are hyperventilating, try breathing into a paper bag. This slows down breathing. This information is not intended to replace advice given to you by your health care provider. Make sure you discuss any questions you have with your health care provider. Document Revised: 05/06/2021 Document Reviewed:  05/06/2021 Elsevier Patient Education  2024 [employer].

## 2024-01-19 NOTE — Assessment & Plan Note (Signed)
 Long history of anxiety per the patient. Recommended she follow up with psychiatry.  Referral placed today

## 2024-01-19 NOTE — Assessment & Plan Note (Signed)
 Becoming more frequent and debilitating Needs follow-up with behavioral health

## 2024-01-19 NOTE — Assessment & Plan Note (Signed)
 Most likely secondary to hyperventilation syndrome Chronic and debilitating Recommend neurology evaluation Referral placed today

## 2024-01-19 NOTE — Assessment & Plan Note (Signed)
 Stable.  Takes Seroquel 400 mg daily at bedtime Sees psychiatrist on a regular basis Also history of chronic generalized anxiety and occasional panic attacks Takes propranolol as needed

## 2024-01-27 ENCOUNTER — Ambulatory Visit (INDEPENDENT_AMBULATORY_CARE_PROVIDER_SITE_OTHER): Admitting: Nurse Practitioner

## 2024-01-27 ENCOUNTER — Encounter: Payer: Self-pay | Admitting: Nurse Practitioner

## 2024-01-27 VITALS — BP 134/86 | HR 93

## 2024-01-27 DIAGNOSIS — E282 Polycystic ovarian syndrome: Secondary | ICD-10-CM

## 2024-01-27 MED ORDER — DROSPIRENONE-ETHINYL ESTRADIOL 3-0.02 MG PO TABS: 1.0000 | ORAL_TABLET | Freq: Every day | ORAL | 0 refills | Status: DC

## 2024-01-27 NOTE — Progress Notes (Signed)
   Acute Office Visit  Subjective:    Patient ID: [name], female    DOB: 1991-07-28, 33 y.o.   MRN: [id number]   HPI 33 y.o. G1P1001 presents today for follow up. Seen as new patient 01/11/2024 with complaints of history of heavy painful periods since menarche. Have been irregular since having daughter in 2013. Skips months and sometimes has 2 cycles per month. Reports hair along chin, jaw line and breasts, difficulty with weight loss. H/O migraines without aura, HSV, depression, bipolar disorder. Normal TSH, A1c in January. Father with prediabetes. Elevated Free and total testosterone.   Patient's last menstrual period was 01/05/2024.    Review of Systems  Constitutional: Negative.   Genitourinary:  Positive for menstrual problem.       Objective:    Physical Exam Constitutional:      Appearance: Normal appearance.   GU: Not indicated  BP 134/86 (BP Location: Right Arm, Patient Position: Sitting, Cuff Size: Small)   Pulse 93   LMP 01/05/2024   SpO2 100%  Wt Readings from Last 3 Encounters:  01/19/24 188 lb (85.3 kg)  01/18/24 187 lb 8 oz (85 kg)  01/12/24 179 lb (81.2 kg)       Assessment & Plan:   Problem List Items Addressed This Visit   None Visit Diagnoses       PCOS (polycystic ovarian syndrome)    -  Primary   Relevant Medications   drospirenone-ethinyl estradiol (YAZ) 3-0.02 MG tablet      Plan: Reviewed lab results and PCOS diagnosis. Educated on PCOS, S/S and management options with lifestyle and medication management with hormonal birth control and/or Metformin. Would like to start OCPs first. Educated on proper use. Will start with first day of next menses.    Return in about 3 months (around 04/27/2024) for PCOS follow up.    Sonya Mackie DNP, 2:46 PM 01/27/2024

## 2024-01-28 ENCOUNTER — Encounter: Payer: Self-pay | Admitting: Nurse Practitioner

## 2024-01-28 NOTE — Telephone Encounter (Signed)
 TW patient. Non urgent, please send to her.

## 2024-02-10 ENCOUNTER — Telehealth: Payer: Self-pay | Admitting: *Deleted

## 2024-02-10 NOTE — Telephone Encounter (Signed)
 Copied from CRM [id number]. Topic: Referral - Status >> Feb 10, 2024 10:02 AM Felicitas Horse wrote: Reason for CRM: PT requesting a status updated on her referral to [facility], Referral that was sent 04/02 shows notes stating that the [facility] [place] does not do those type of referrals, can pt get a status update.    PT callback [phone]

## 2024-02-11 NOTE — Telephone Encounter (Signed)
 E2C2 sent to the wrong office

## 2024-02-11 NOTE — Telephone Encounter (Signed)
 This is not our Patient.  The Neuro Referral was placed by Dr. Hilaria Loveless Office - She will need to call their Office in reference to her care.

## 2024-02-17 ENCOUNTER — Ambulatory Visit: Admitting: Licensed Clinical Social Worker

## 2024-03-02 ENCOUNTER — Encounter: Attending: Emergency Medicine | Admitting: Dietician

## 2024-03-02 ENCOUNTER — Encounter: Payer: Self-pay | Admitting: Dietician

## 2024-03-02 VITALS — Wt 180.7 lb

## 2024-03-02 DIAGNOSIS — E66811 Obesity, class 1: Secondary | ICD-10-CM | POA: Diagnosis present

## 2024-03-02 DIAGNOSIS — Z6832 Body mass index (BMI) 32.0-32.9, adult: Secondary | ICD-10-CM | POA: Insufficient documentation

## 2024-03-02 NOTE — Progress Notes (Signed)
 Medical Nutrition Therapy  Appointment Start time:  [id number]  Appointment End time: 1155   Primary concerns today: healthy eating   Referral diagnosis: E66.9 Preferred learning style: no preference indicated Learning readiness: ready   NUTRITION ASSESSMENT   Anthropometrics   Wt 03/02/24: 180.7 lb Wt 01/18/24: 187.5 lb  Clinical Medical Hx: anxiety, depression, hyperlipidemia, IBS, PCOS Medications: reviewed Labs: 11/15/23: chol 235, LDL 146,  Notable Signs/Symptoms: GI discomfort Food Allergies: none reported  Lifestyle & Dietary Hx  Pt reports following previous visit she was doing well with her walking, and met her goal of trying 2 new foods (tried bell pepper and lychee). Pt states she also tried to incorporate a vegetarian meal on Wednesdays, but reports the last few weeks she has fell into a depressive slump, and has not had the motivation to go walking (along with a week of rain) and also has not had an appetite. Pt reports most days she is just eating dinner, and may have something small earlier in the day. Pt reports she was doing well with water intake and states she was drinking 2 bottles daily, but is now not drinking much.   Pt states she has not had her back problem diagnosed yet, but is planning to get a scan soon. Pt reports she got a PCOS diagnosis and started birth control.    Estimated daily fluid intake: 16 oz Supplements: none reported Sleep: 6 hours.  Stress / self-care: high stress  Current average weekly physical activity: walking occasionally  24-Hr Dietary Recall First Meal: none Snack: none Second Meal: 11am: 2 chicken tenders in oven Snack: none Third Meal: garlic herb chicken and potato onion roasted and canned green beans Snack: none Beverages: sweet tea, koolaid, water   NUTRITION DIAGNOSIS  NB-1.1 Food and nutrition-related knowledge deficit As related to lack of prior education by a registered dietitian.  As evidenced by pt  report.   NUTRITION INTERVENTION  Nutrition education (E-1) on the following topics:   Hydration Adequate hydration is crucial for optimal nutrition and overall health. It aids in the digestion and absorption of nutrients, ensuring the body can effectively process and transport them to cells. Water also supports detoxification by helping the kidneys flush out waste, while maintaining hydration promotes energy levels and metabolism. Proper hydration helps control appetite and ensures a healthy balance of electrolytes, which are vital for muscle function and cellular processes. Overall, staying well-hydrated is essential for the body to utilize nutrients efficiently and maintain balance.  Physical Activity Finding an exercise you enjoy is crucial for maintaining long-term fitness and overall health. Enjoyable activities are more likely to become regular habits, making it easier to stay consistent with physical activity. When you look forward to your workouts, exercise becomes a positive experience rather than a chore, reducing the likelihood of burnout or quitting. Enjoyable exercise also enhances mental well-being, as engaging in activities you love can boost mood, reduce stress, and provide a sense of accomplishment.   Plate Method Fruits & Vegetables: Aim to fill half your plate with a variety of fruits and vegetables. They are rich in vitamins, minerals, and fiber, and can help reduce the risk of chronic diseases. Choose a colorful assortment of fruits and vegetables to ensure you get a wide range of nutrients. Grains and Starches: Make at least half of your grain choices whole grains, such as brown rice, whole wheat bread, and oats. Whole grains provide fiber, which aids in digestion and healthy cholesterol levels. Aim for whole forms  of starchy vegetables such as potatoes, sweet potatoes, beans, peas, and corn, which are fiber rich and provide many vitamins and minerals.  Protein: Incorporate lean  sources of protein, such as poultry, fish, beans, nuts, and seeds, into your meals. Protein is essential for building and repairing tissues, staying full, balancing blood sugar, as well as supporting immune function. Dairy: Include low-fat or fat-free dairy products like milk, yogurt, and cheese in your diet. Dairy foods are excellent sources of calcium and vitamin D, which are crucial for bone health.   Cholesterol Nutrition Education Soluble fiber and impact on lowering cholesterol: Sources: Oats, barley, beans, lentils, fruits (apples, oranges), vegetables (carrots, broccoli), and flaxseeds. Benefits: Soluble fiber reduces the absorption of cholesterol into your bloodstream. Choose Healthy Unsaturated Fats and Omega 3's. Sources: Olive oil, canola oil, avocados, nuts, and Fatty fish (salmon, trout), walnuts, flaxseeds, and chia seeds. Benefits: Helps raise HDL cholesterol and lower LDL cholesterol. Omega-3s help reduce triglycerides and raise HDL cholesterol. Limit Saturated Fats: Sources: Red meat, full-fat dairy products, butter, and coconut oil. Benefits: Reducing intake helps lower LDL cholesterol levels. Eat Plenty of Fruits and Vegetables. Benefits: High in fiber and antioxidants, which help lower LDL cholesterol. Eat Whole Grains. Sources: Brown rice, whole wheat bread, quinoa, and whole grain pasta. Benefits: Whole grains contain more fiber and nutrients than refined grains.   Handouts Provided Include  Plate Method  Learning Style & Readiness for Change Teaching method utilized: Visual & Auditory  Demonstrated degree of understanding via: Teach Back  Barriers to learning/adherence to lifestyle change: none  Goals Established by Pt  New Goals:  Goal 1: Go walking 2-3 times per week for 30 minutes.   Goal 2: Aim to drink at least 2 water bottles daily.   Assessment of Previous Goals:   Goal 1: have a meatless meal (beans for protein) at least one night per week. - goal in  progress  Goal 2: try 2 new foods between now and next visit. - goal met.  MONITORING & EVALUATION Dietary intake, weekly physical activity, and follow up in 2 months.  Next Steps  Patient is to call for questions.

## 2024-03-02 NOTE — Patient Instructions (Signed)
 New Goals:  Goal 1: Go walking 2-3 times per week for 30 minutes.   Goal 2: Aim to drink at least 2 water bottles daily.

## 2024-03-09 ENCOUNTER — Ambulatory Visit (INDEPENDENT_AMBULATORY_CARE_PROVIDER_SITE_OTHER): Admitting: Clinical

## 2024-03-09 DIAGNOSIS — F3189 Other bipolar disorder: Secondary | ICD-10-CM | POA: Diagnosis not present

## 2024-03-09 DIAGNOSIS — F439 Reaction to severe stress, unspecified: Secondary | ICD-10-CM | POA: Diagnosis not present

## 2024-03-09 DIAGNOSIS — F419 Anxiety disorder, unspecified: Secondary | ICD-10-CM
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
## 2024-03-09 NOTE — Progress Notes (Unsigned)
   Sonya Barthel, LCSW

## 2024-03-10 ENCOUNTER — Other Ambulatory Visit: Payer: Self-pay | Admitting: Neurosurgery

## 2024-03-14 NOTE — Progress Notes (Signed)
 Voicemail left with Sherian Dimitri, [place] scheduler for Dr. Michale Age, requesting consent order be modified to completely spell out "myelo" on surgical consent. Callback number also given for any questions.

## 2024-03-14 NOTE — Pre-Procedure Instructions (Addendum)
 Surgical Instructions   Your procedure is scheduled on Mar 17, 2024. Report to [facility] Main Entrance "A" at 10:00 A.M., then check in with the Admitting office. Any questions or running late day of surgery: call [phone]  Questions prior to your surgery date: call [phone], Monday-Friday, 8am-4pm. If you experience any cold or flu symptoms such as cough, fever, chills, shortness of breath, etc. between now and your scheduled surgery, please notify us  at the above number.     Remember:  Do not eat after midnight the night before your surgery  You may drink clear liquids until 9:00 AM the morning of your surgery.   Clear liquids allowed are: Water, Non-Citrus Juices (without pulp), Carbonated Beverages, Clear Tea (no milk, honey, etc.), Black Coffee Only (NO MILK, CREAM OR POWDERED CREAMER of any kind), and Gatorade.    Take these medicines the morning of surgery with A SIP OF WATER: drospirenone -ethinyl estradiol  (YAZ)    May take these medicines IF NEEDED: clonazePAM (KLONOPIN)  oxycodone  (OXY-IR)  propranolol (INDERAL)    One week prior to surgery, STOP taking any Aspirin (unless otherwise instructed by your surgeon) Aleve , Naproxen , Ibuprofen , Motrin , Advil , Goody's, BC's, all herbal medications, fish oil, and non-prescription vitamins.                     Do NOT Smoke (Tobacco/Vaping) for 24 hours prior to your procedure.  If you use a CPAP at night, you may bring your mask/headgear for your overnight stay.   You will be asked to remove any contacts, glasses, piercing's, hearing aid's, dentures/partials prior to surgery. Please bring cases for these items if needed.    Patients discharged the day of surgery will not be allowed to drive home, and someone needs to stay with them for 24 hours.  SURGICAL WAITING ROOM VISITATION Patients may have no more than 2 support people in the waiting area - these visitors may rotate.   Pre-op nurse will coordinate an  appropriate time for 1 ADULT support person, who may not rotate, to accompany patient in pre-op.  Children under the age of 15 must have an adult with them who is not the patient and must remain in the main waiting area with an adult.  If the patient needs to stay at the hospital during part of their recovery, the visitor guidelines for inpatient rooms apply.  Please refer to the [facility] website for the visitor guidelines for any additional information.   If you received a COVID test during your pre-op visit  it is requested that you wear a mask when out in public, stay away from anyone that may not be feeling well and notify your surgeon if you develop symptoms. If you have been in contact with anyone that has tested positive in the last 10 days please notify you surgeon.       Pre-operative CHG Bathing Instructions   You can play a key role in reducing the risk of infection after surgery. Your skin needs to be as free of germs as possible. You can reduce the number of germs on your skin by washing with CHG (chlorhexidine  gluconate) soap before surgery. CHG is an antiseptic soap that kills germs and continues to kill germs even after washing.   DO NOT use if you have an allergy to chlorhexidine /CHG or antibacterial soaps. If your skin becomes reddened or irritated, stop using the CHG and notify one of our RNs at [phone].  TAKE A SHOWER THE NIGHT BEFORE SURGERY AND THE DAY OF SURGERY    Please keep in mind the following:  DO NOT shave, including legs and underarms, 48 hours prior to surgery.   You may shave your face before/day of surgery.  Place clean sheets on your bed the night before surgery Use a clean washcloth (not used since being washed) for each shower. DO NOT sleep with pet's night before surgery.  CHG Shower Instructions:  Wash your face and private area with normal soap. If you choose to wash your hair, wash first with your normal shampoo.  After you  use shampoo/soap, rinse your hair and body thoroughly to remove shampoo/soap residue.  Turn the water OFF and apply half the bottle of CHG soap to a CLEAN washcloth.  Apply CHG soap ONLY FROM YOUR NECK DOWN TO YOUR TOES (washing for 3-5 minutes)  DO NOT use CHG soap on face, private areas, open wounds, or sores.  Pay special attention to the area where your surgery is being performed.  If you are having back surgery, having someone wash your back for you may be helpful. Wait 2 minutes after CHG soap is applied, then you may rinse off the CHG soap.  Pat dry with a clean towel  Put on clean pajamas    Additional instructions for the day of surgery: DO NOT APPLY any lotions, deodorants, cologne, or perfumes.   Do not wear jewelry or makeup Do not wear nail polish, gel polish, artificial nails, or any other type of covering on natural nails (fingers and toes) Do not bring valuables to the hospital. [facility] is not responsible for valuables/personal belongings. Put on clean/comfortable clothes.  Please brush your teeth.  Ask your nurse before applying any prescription medications to the skin.

## 2024-03-15 ENCOUNTER — Encounter (HOSPITAL_COMMUNITY): Payer: Self-pay

## 2024-03-15 ENCOUNTER — Other Ambulatory Visit: Payer: Self-pay | Admitting: Neurosurgery

## 2024-03-15 ENCOUNTER — Other Ambulatory Visit: Payer: Self-pay

## 2024-03-15 ENCOUNTER — Encounter (HOSPITAL_COMMUNITY)
Admission: RE | Admit: 2024-03-15 | Discharge: 2024-03-15 | Disposition: A | Discharge status: Home / Self Care | Source: Ambulatory Visit | Attending: Neurosurgery | Admitting: Neurosurgery

## 2024-03-15 VITALS — BP 136/92 | HR 108 | Temp 97.8°F | Resp 18 | Ht 63.0 in | Wt 177.2 lb

## 2024-03-15 DIAGNOSIS — Z01818 Encounter for other preprocedural examination: Secondary | ICD-10-CM

## 2024-03-15 DIAGNOSIS — Z01812 Encounter for preprocedural laboratory examination: Secondary | ICD-10-CM | POA: Diagnosis not present

## 2024-03-15 HISTORY — DX: Other complications of anesthesia, initial encounter: T88.59XA

## 2024-03-15 HISTORY — DX: Other specified postprocedural states: Z98.890

## 2024-03-15 LAB — CBC
HCT: 40.5 % (ref 36.0–46.0)
Hemoglobin: 13.7 g/dL (ref 12.0–15.0)
MCH: 30 pg (ref 26.0–34.0)
MCHC: 33.8 g/dL (ref 30.0–36.0)
MCV: 88.8 fL (ref 80.0–100.0)
Platelets: 360 10*3/uL (ref 150–400)
RBC: 4.56 MIL/uL (ref 3.87–5.11)
RDW: 11.5 % (ref 11.5–15.5)
WBC: 10.4 10*3/uL (ref 4.0–10.5)
nRBC: 0 % (ref 0.0–0.2)

## 2024-03-15 NOTE — Progress Notes (Addendum)
 PCP - Dr. Isidro Margo Sagardia Cardiologist - Denies  PPM/ICD - Denies Device Orders - n/a Rep Notified - n/a  Chest x-ray - n/a EKG - 12/15/2022 Stress Test - Denies ECHO - Denies Cardiac Cath - Denies  Sleep Study - Denies CPAP - n/a  No DM  Last dose of GLP1 agonist- n/a GLP1 instructions: n/a  Blood Thinner Instructions: n/a Aspirin Instructions: n/a  ERAS Protcol - Clear liquids until 0900 morning of surgery PRE-SURGERY Ensure or G2- n/a  COVID TEST- n/a   Anesthesia review: Yes. Difficult intubation flag from previous surgery April 2024. Discussed with Rudy Costain, PA-C who will review.   Patient denies shortness of breath, fever, cough and chest pain at PAT appointment. Pt denies any respiratory illness/infection in the last two months.   All instructions explained to the patient, with a verbal understanding of the material. Patient agrees to go over the instructions while at home for a better understanding. Patient also instructed to self quarantine after being tested for COVID-19. The opportunity to ask questions was provided.

## 2024-03-16 NOTE — Anesthesia Preprocedure Evaluation (Signed)
 Anesthesia Evaluation  Patient identified by MRN, date of birth, ID band Patient awake    Reviewed: Allergy & Precautions, NPO status , Patient's Chart, lab work & pertinent test results  History of Anesthesia Complications (+) history of anesthetic complications  Airway Mallampati: I  TM Distance: >3 FB Neck ROM: Full    Dental no notable dental hx. (+) Partial Upper, Dental Advisory Given   Pulmonary    Pulmonary exam normal breath sounds clear to auscultation       Cardiovascular Normal cardiovascular exam Rhythm:Regular Rate:Normal     Neuro/Psych  PSYCHIATRIC DISORDERS Anxiety Depression Bipolar Disorder      GI/Hepatic Neg liver ROS,,,  Endo/Other  negative endocrine ROS    Renal/GU      Musculoskeletal negative musculoskeletal ROS (+)    Abdominal   Peds  Hematology   Anesthesia Other Findings   Reproductive/Obstetrics negative OB ROS                             Anesthesia Physical Anesthesia Plan  ASA: 2  Anesthesia Plan: General   Post-op Pain Management: Minimal or no pain anticipated   Induction: Intravenous  PONV Risk Score and Plan: 3 and Treatment may vary due to age or medical condition, Midazolam , Ondansetron  and Propofol  infusion  Airway Management Planned: Oral ETT  Additional Equipment: None  Intra-op Plan:   Post-operative Plan: Extubation in OR  Informed Consent: I have reviewed the patients History and Physical, chart, labs and discussed the procedure including the risks, benefits and alternatives for the proposed anesthesia with the patient or authorized representative who has indicated his/her understanding and acceptance.     Dental advisory given  Plan Discussed with: CRNA and Surgeon  Anesthesia Plan Comments:        Anesthesia Quick Evaluation

## 2024-03-17 ENCOUNTER — Encounter (HOSPITAL_COMMUNITY): Payer: Self-pay | Admitting: Neurosurgery

## 2024-03-17 ENCOUNTER — Encounter (HOSPITAL_COMMUNITY)
Admission: RE | Disposition: A | Discharge status: Home / Self Care | Payer: Self-pay | Source: Home / Self Care | Attending: Neurosurgery

## 2024-03-17 ENCOUNTER — Ambulatory Visit (HOSPITAL_BASED_OUTPATIENT_CLINIC_OR_DEPARTMENT_OTHER): Payer: Self-pay

## 2024-03-17 ENCOUNTER — Other Ambulatory Visit: Payer: Self-pay

## 2024-03-17 ENCOUNTER — Ambulatory Visit (HOSPITAL_COMMUNITY)

## 2024-03-17 ENCOUNTER — Ambulatory Visit (HOSPITAL_COMMUNITY): Payer: Self-pay | Admitting: Physician Assistant

## 2024-03-17 ENCOUNTER — Ambulatory Visit (HOSPITAL_COMMUNITY)
Admission: RE | Admit: 2024-03-17 | Discharge: 2024-03-17 | Disposition: A | Discharge status: Home / Self Care | Attending: Neurosurgery | Admitting: Neurosurgery

## 2024-03-17 DIAGNOSIS — F17290 Nicotine dependence, other tobacco product, uncomplicated: Secondary | ICD-10-CM | POA: Insufficient documentation

## 2024-03-17 DIAGNOSIS — M5126 Other intervertebral disc displacement, lumbar region: Secondary | ICD-10-CM

## 2024-03-17 DIAGNOSIS — F408 Other phobic anxiety disorders: Secondary | ICD-10-CM | POA: Insufficient documentation

## 2024-03-17 DIAGNOSIS — F319 Bipolar disorder, unspecified: Secondary | ICD-10-CM | POA: Diagnosis not present

## 2024-03-17 DIAGNOSIS — Z01818 Encounter for other preprocedural examination: Secondary | ICD-10-CM

## 2024-03-17 DIAGNOSIS — M79606 Pain in leg, unspecified: Secondary | ICD-10-CM | POA: Insufficient documentation

## 2024-03-17 DIAGNOSIS — M545 Low back pain, unspecified: Secondary | ICD-10-CM | POA: Insufficient documentation

## 2024-03-17 HISTORY — PX: LUMBAR PUNCTURE: SHX6733

## 2024-03-17 LAB — POCT PREGNANCY, URINE: Preg Test, Ur: NEGATIVE

## 2024-03-17 SURGERY — LUMBAR PUNCTURE
Anesthesia: Monitor Anesthesia Care | Site: Back

## 2024-03-17 MED ORDER — SUGAMMADEX SODIUM 200 MG/2ML IV SOLN
INTRAVENOUS | Status: DC | PRN
Start: 2024-03-17 — End: 2024-03-17
  Administered 2024-03-17: 315.6 mg via INTRAVENOUS

## 2024-03-17 MED ORDER — MIDAZOLAM HCL 2 MG/2ML IJ SOLN
INTRAMUSCULAR | Status: DC | PRN
  Administered 2024-03-17: 2 mg via INTRAVENOUS

## 2024-03-17 MED ORDER — LIDOCAINE 2% (20 MG/ML) 5 ML SYRINGE
INTRAMUSCULAR | Status: DC | PRN
  Administered 2024-03-17: 100 mg via INTRAVENOUS

## 2024-03-17 MED ORDER — ROCURONIUM BROMIDE 10 MG/ML (PF) SYRINGE
PREFILLED_SYRINGE | INTRAVENOUS | Status: AC
  Filled 2024-03-17: qty 10

## 2024-03-17 MED ORDER — CHLORHEXIDINE GLUCONATE CLOTH 2 % EX PADS: 6.0000 | MEDICATED_PAD | Freq: Once | CUTANEOUS | Status: DC

## 2024-03-17 MED ORDER — MIDAZOLAM HCL 2 MG/2ML IJ SOLN
INTRAMUSCULAR | Status: AC
  Filled 2024-03-17: qty 2

## 2024-03-17 MED ORDER — ONDANSETRON HCL 4 MG/2ML IJ SOLN
INTRAMUSCULAR | Status: DC | PRN
  Administered 2024-03-17: 4 mg via INTRAVENOUS

## 2024-03-17 MED ORDER — LACTATED RINGERS IV SOLN: INTRAVENOUS | Status: DC

## 2024-03-17 MED ORDER — LIDOCAINE 2% (20 MG/ML) 5 ML SYRINGE
INTRAMUSCULAR | Status: AC
  Filled 2024-03-17: qty 5

## 2024-03-17 MED ORDER — PROPOFOL 1000 MG/100ML IV EMUL
INTRAVENOUS | Status: AC
  Filled 2024-03-17: qty 100

## 2024-03-17 MED ORDER — FENTANYL CITRATE (PF) 250 MCG/5ML IJ SOLN
INTRAMUSCULAR | Status: AC
  Filled 2024-03-17: qty 5

## 2024-03-17 MED ORDER — ORAL CARE MOUTH RINSE: 15.0000 mL | Freq: Once | OROMUCOSAL | Status: AC

## 2024-03-17 MED ORDER — PROPOFOL 500 MG/50ML IV EMUL
INTRAVENOUS | Status: DC | PRN
  Administered 2024-03-17: 125 ug/kg/min via INTRAVENOUS

## 2024-03-17 MED ORDER — IOHEXOL 180 MG/ML  SOLN
INTRAMUSCULAR | Status: DC | PRN
  Administered 2024-03-17: 20 mL

## 2024-03-17 MED ORDER — CEFAZOLIN SODIUM-DEXTROSE 2-4 GM/100ML-% IV SOLN: 2.0000 g | INTRAVENOUS | Status: DC

## 2024-03-17 MED ORDER — DEXMEDETOMIDINE HCL IN NACL 80 MCG/20ML IV SOLN
INTRAVENOUS | Status: DC | PRN
  Administered 2024-03-17: 16 ug via INTRAVENOUS
  Administered 2024-03-17: 4 ug via INTRAVENOUS

## 2024-03-17 MED ORDER — CHLORHEXIDINE GLUCONATE 0.12 % MT SOLN
15.0000 mL | Freq: Once | OROMUCOSAL | Status: AC
  Administered 2024-03-17: 15 mL via OROMUCOSAL
  Filled 2024-03-17: qty 15

## 2024-03-17 MED ORDER — ROCURONIUM BROMIDE 10 MG/ML (PF) SYRINGE
PREFILLED_SYRINGE | INTRAVENOUS | Status: DC | PRN
  Administered 2024-03-17: 50 mg via INTRAVENOUS
  Administered 2024-03-17: 30 mg via INTRAVENOUS

## 2024-03-17 MED ORDER — CEFAZOLIN SODIUM-DEXTROSE 2-4 GM/100ML-% IV SOLN
2.0000 g | INTRAVENOUS | Status: DC
  Filled 2024-03-17: qty 100

## 2024-03-17 MED ORDER — PROPOFOL 10 MG/ML IV BOLUS
INTRAVENOUS | Status: DC | PRN
  Administered 2024-03-17: 150 mg via INTRAVENOUS

## 2024-03-17 MED ORDER — FENTANYL CITRATE (PF) 250 MCG/5ML IJ SOLN
INTRAMUSCULAR | Status: DC | PRN
Start: 2024-03-17 — End: 2024-03-17
  Administered 2024-03-17: 50 ug via INTRAVENOUS

## 2024-03-17 MED ORDER — DEXAMETHASONE SODIUM PHOSPHATE 10 MG/ML IJ SOLN
INTRAMUSCULAR | Status: DC | PRN
  Administered 2024-03-17: 10 mg via INTRAVENOUS

## 2024-03-17 SURGICAL SUPPLY — 13 items
BAG COUNTER SPONGE SURGICOUNT (BAG) ×1 IMPLANT
DRAIN SUBARACHNOID (WOUND CARE) IMPLANT
DRAPE HALF SHEET 40X57 (DRAPES) IMPLANT
DURAPREP 6ML APPLICATOR 50/CS (WOUND CARE) ×1 IMPLANT
GAUZE 4X4 16PLY ~~LOC~~+RFID DBL (SPONGE) ×1 IMPLANT
GAUZE SPONGE 4X4 12PLY STRL (GAUZE/BANDAGES/DRESSINGS) IMPLANT
GLOVE ECLIPSE 6.5 STRL STRAW (GLOVE) ×1 IMPLANT
GOWN STRL REUS W/ TWL LRG LVL3 (GOWN DISPOSABLE) ×1 IMPLANT
PATTIES SURGICAL .5 X.5 (GAUZE/BANDAGES/DRESSINGS) ×1 IMPLANT
PATTIES SURGICAL 1X1 (DISPOSABLE) ×1 IMPLANT
SUT 3-0 BLK 1X30 PSL (SUTURE) IMPLANT
SYR 30ML SLIP (SYRINGE) ×1 IMPLANT
TOWEL GREEN STERILE (TOWEL DISPOSABLE) ×2 IMPLANT

## 2024-03-17 NOTE — Transfer of Care (Signed)
 Immediate Anesthesia Transfer of Care Note  Patient: [name]  Procedure(s) Performed: LUMBAR PUNCTURE (Back)  Patient Location: PACU  Anesthesia Type:General  Level of Consciousness: drowsy  Airway & Oxygen Therapy: Patient Spontanous Breathing and Patient connected to face mask oxygen  Post-op Assessment: Report given to RN and Post -op Vital signs reviewed and stable  Post vital signs: Reviewed and stable  Last Vitals:  Vitals Value Taken Time  BP 110/71 03/17/24 1353  Temp    Pulse 87 03/17/24 1353  Resp 22 03/17/24 1353  SpO2 100 % 03/17/24 1353  Vitals shown include unfiled device data.  Last Pain:  Vitals:   03/17/24 1039  TempSrc: Oral  PainSc:          Complications: No notable events documented.

## 2024-03-17 NOTE — Anesthesia Postprocedure Evaluation (Signed)
 Anesthesia Post Note  Patient: [name]  Procedure(s) Performed: LUMBAR PUNCTURE (Back)     Patient location during evaluation: PACU Anesthesia Type: General Level of consciousness: awake and alert Pain management: pain level controlled Vital Signs Assessment: post-procedure vital signs reviewed and stable Respiratory status: spontaneous breathing, nonlabored ventilation, respiratory function stable and patient connected to nasal cannula oxygen Cardiovascular status: blood pressure returned to baseline and stable Postop Assessment: no apparent nausea or vomiting Anesthetic complications: no  No notable events documented.  Last Vitals:  Vitals:   03/17/24 1415 03/17/24 1430  BP: 99/84 110/78  Pulse: 69 75  Resp: 15 10  Temp:    SpO2: 99% 98%    Last Pain:  Vitals:   03/17/24 1430  TempSrc:   PainSc: 0-No pain                 Rosalita Combe

## 2024-03-17 NOTE — H&P (Signed)
 BP 129/89   Pulse (!) 102   Temp 97.9 F (36.6 C) (Oral)   Resp 18   Ht 5\' 3"  (1.6 m)   Wt 78.9 kg   LMP 03/01/2024 (Approximate)   SpO2 97%   BMI 30.82 kg/m  [name] presents for a myelogram under a general anesthetic due to anxiety and needle phobia. She has complained of back and lower extremity pain since her postoperative period. We have tried twice to perform a myelogram and have failed twice, thus the decision to do this today with a general anesthetic.  The MRI performed postoperatively did not show a problem which I could identify continuing to cause her discomfort.  Allergies  Allergen Reactions   Fentanyl  Nausea And Vomiting   Aluminum Rash   Nickel Rash   Past Medical History:  Diagnosis Date   Abscess    Anxiety    Bipolar disorder (HCC)    Complication of anesthesia    Depression    Genital herpes    Migraines    "from time to time" per pt   PONV (postoperative nausea and vomiting)    Past Surgical History:  Procedure Laterality Date   CERVICAL DISC SURGERY  2018   Fusion - Dr. Debrah Fan with [facility]   LUMBAR LAMINECTOMY/DECOMPRESSION MICRODISCECTOMY N/A 04/10/2022   Procedure: Lumbar three--four, Lumbar four-five Microdisscectomy;  Surgeon: Audie Bleacher, MD;  Location: [facility] OR;  Service: Neurosurgery;  Laterality: N/A;  RM 21 3C   LUMBAR LAMINECTOMY/DECOMPRESSION MICRODISCECTOMY Right 02/04/2023   Procedure: Right Lumbar five-Sacral one Microdiscectomy;  Surgeon: Audie Bleacher, MD;  Location: [facility] OR;  Service: Neurosurgery;  Laterality: Right;   MOUTH SURGERY     multiple extractions   Family History  Problem Relation Age of Onset   Depression Mother    Hyperlipidemia Mother    Hypertension Mother    Depression Father    Hypertension Father    Hyperlipidemia Father    Breast cancer Maternal Grandmother    Alzheimer's disease Maternal Grandmother    Social History   Socioeconomic History   Marital status: Single    Spouse name: Tiburcio Fly    Number of children: 1   Years of education: Not on file   Highest education level: Some college, no degree  Occupational History   Not on file  Tobacco Use   Smoking status: Never   Smokeless tobacco: Never  Vaping Use   Vaping status: Some Days   Substances: THC   Devices: use for the pain  Substance and Sexual Activity   Alcohol use: Not Currently   Drug use: Yes    Types: Marijuana    Comment: 1-2 times per day   Sexual activity: Yes    Birth control/protection: Other-see comments    Comment: vasectomy  Other Topics Concern   Not on file  Social History Narrative   Home with fiancee. Daughter 39 years old   Social Drivers of Corporate investment banker Strain: Not on file  Food Insecurity: No Food Insecurity (01/18/2024)   Hunger Vital Sign    Worried About Running Out of Food in the Last Year: Never true    Ran Out of Food in the Last Year: Never true  Transportation Needs: Not on file  Physical Activity: Not on file  Stress: Not on file  Social Connections: Not on file  Intimate Partner Violence: Not on file   Physical Exam Constitutional:      Appearance: Normal appearance. She is normal weight.  HENT:     Head: Normocephalic and atraumatic.     Nose: Nose normal.     Mouth/Throat:     Mouth: Mucous membranes are moist.     Pharynx: Oropharynx is clear.  Eyes:     Extraocular Movements: Extraocular movements intact.     Pupils: Pupils are equal, round, and reactive to light.  Cardiovascular:     Rate and Rhythm: Normal rate and regular rhythm.     Pulses: Normal pulses.  Pulmonary:     Effort: Pulmonary effort is normal.  Abdominal:     General: Abdomen is flat.     Palpations: Abdomen is soft.  Musculoskeletal:        General: Normal range of motion.     Cervical back: Normal range of motion and neck supple.  Skin:    General: Skin is warm and dry.  Neurological:     General: No focal deficit present.     Mental Status: She is alert and oriented  to person, place, and time.     Motor: No weakness.     Gait: Gait abnormal.  Psychiatric:        Behavior: Behavior normal.        Thought Content: Thought content normal.        Judgment: Judgment normal.     Comments: anxious   OR for lumbar puncture and myelogram

## 2024-03-17 NOTE — Op Note (Signed)
 03/17/2024 Lumbar Myelogram  PATIENT:  [name] is a 33 y.o. female With severe anxiety surrounding needles. After two failed attempts at performing a myelogram, [name] did not make it past short stay. She decided to do this under a general anesthetic.  PRE-OPERATIVE DIAGNOSIS:  lumbago  POST-OPERATIVE DIAGNOSIS:  lumbago  PROCEDURE:  Lumbar Myelogram  SURGEON:  Vernel Langenderfer  ANESTHESIA:   general LOCAL MEDICATIONS USED:  LIDOCAINE   Procedure Note: [name] is a 33 y.o. female Was taken to the fluoroscopy suite and  positioned prone on the OR table. Her back was prepared and draped in a sterile manner. I infiltrated 5 cc lidocaine  into the lumbar region. I then introduced a spinal needle into the thecal sac at the L3/4 interlaminar space. I infiltrated 20cc of Isovue 180 into the thecal sac. Fluoroscopy showed the needle and contrast in the thecal sac. [name] tolerated the procedure well. she Will be taken to CT for evaluation.     PATIENT DISPOSITION:  PACU - hemodynamically stable.

## 2024-03-17 NOTE — Anesthesia Procedure Notes (Signed)
 Procedure Name: Intubation Date/Time: 03/17/2024 12:23 PM  Performed by: Andee Bamberger, CRNAPre-anesthesia Checklist: Patient identified, Emergency Drugs available, Suction available and Patient being monitored Patient Re-evaluated:Patient Re-evaluated prior to induction Oxygen Delivery Method: Circle system utilized Preoxygenation: Pre-oxygenation with 100% oxygen Induction Type: IV induction Ventilation: Mask ventilation without difficulty Laryngoscope Size: Mac and 4 Grade View: Grade I Tube type: Oral Tube size: 7.0 mm Number of attempts: 1 Airway Equipment and Method: Stylet and Oral airway Placement Confirmation: ETT inserted through vocal cords under direct vision, positive ETCO2 and breath sounds checked- equal and bilateral Secured at: 22 cm Tube secured with: Tape Dental Injury: Teeth and Oropharynx as per pre-operative assessment

## 2024-03-18 ENCOUNTER — Encounter (HOSPITAL_COMMUNITY): Payer: Self-pay | Admitting: Neurosurgery

## 2024-03-30 ENCOUNTER — Encounter: Payer: Self-pay | Admitting: Neurology

## 2024-03-31 ENCOUNTER — Other Ambulatory Visit: Payer: Self-pay

## 2024-03-31 DIAGNOSIS — R202 Paresthesia of skin: Secondary | ICD-10-CM

## 2024-04-04 ENCOUNTER — Ambulatory Visit: Admitting: Clinical

## 2024-04-13 ENCOUNTER — Ambulatory Visit: Admitting: Neurology

## 2024-04-13 DIAGNOSIS — R202 Paresthesia of skin: Secondary | ICD-10-CM

## 2024-04-13 NOTE — Procedures (Signed)
  [facility]  [address] [place], [address]  [place], [place] [address] Tel: [phone] Fax: [phone] Test Date:  04/13/2024  Patient: [name] DOB: June 10, 1991 Physician: Tonita Blanch, DO  Sex: Female Height: 5' 3 Ref Phys: Rockey Peru, MD  ID#: [id number]   Technician:    History: This is a 33 year old female with history of lumbar surgery referred for evaluation of right leg pain.  NCV & EMG Findings: Electrodiagnostic testing was limited to nerve conduction study only, due to needle phobia.  Findings show: Right sural and superficial peroneal sensory responses are within normal limits. Right peroneal and tibial motor responses are within normal limits.  Impression: This is a normal nerve conduction study of the right lower extremity.  In particular, there is no evidence of a large fiber sensorimotor polyneuropathy.  As requested, needle electromyography was not performed.    ___________________________ Tonita Blanch, DO    Nerve Conduction Studies   Stim Site NR Peak (ms) Norm Peak (ms) O-P Amp (V) Norm O-P Amp  Right Sup Peroneal Anti Sensory (Ant Lat Mall)  32 C  12 cm    2.0 <4.5 28.1 >5  Right Sural Anti Sensory (Lat Mall)  32 C  Calf    2.1 <4.5 36.7 >5     Stim Site NR Onset (ms) Norm Onset (ms) O-P Amp (mV) Norm O-P Amp Site1 Site2 Delta-0 (ms) Dist (cm) Vel (m/s) Norm Vel (m/s)  Right Peroneal Motor (Ext Dig Brev)  32 C  Ankle    2.6 <5.5 8.3 >3 B Fib Ankle 6.4 37.0 58 >40  B Fib    9.0  8.0  Poplt B Fib 1.2 8.0 67 >40  Poplt    10.2  7.9         Right Peroneal TA Motor (Tib Ant)  32 C  Fib Head    2.3 <4.0 7.8 >4 Poplit Fib Head 1.1 8.0 73 >40  Poplit    3.4 <5.7 7.8         Right Tibial Motor (Abd Hall Brev)  32 C  Ankle    2.4 <6.0 12.9 >8 Knee Ankle 6.7 39.0 58 >40  Knee    9.1  12.0             Waveforms:

## 2024-04-24 ENCOUNTER — Telehealth: Payer: Self-pay

## 2024-04-24 ENCOUNTER — Encounter: Payer: Self-pay | Admitting: Dermatology

## 2024-04-24 ENCOUNTER — Ambulatory Visit: Admitting: Dermatology

## 2024-04-24 VITALS — BP 103/74

## 2024-04-24 DIAGNOSIS — L300 Nummular dermatitis: Secondary | ICD-10-CM

## 2024-04-24 DIAGNOSIS — B079 Viral wart, unspecified: Secondary | ICD-10-CM | POA: Diagnosis not present

## 2024-04-24 DIAGNOSIS — L309 Dermatitis, unspecified: Secondary | ICD-10-CM

## 2024-04-24 MED ORDER — TRIAMCINOLONE ACETONIDE 0.1 % EX OINT: 1.0000 | TOPICAL_OINTMENT | Freq: Two times a day (BID) | CUTANEOUS | 2 refills | Status: DC

## 2024-04-24 MED ORDER — MUPIROCIN 2 % EX OINT: 1.0000 | TOPICAL_OINTMENT | Freq: Two times a day (BID) | CUTANEOUS | 0 refills | Status: DC

## 2024-04-24 MED ORDER — CLOTRIMAZOLE-BETAMETHASONE 1-0.05 % EX CREA: 1.0000 | TOPICAL_CREAM | Freq: Two times a day (BID) | CUTANEOUS | 2 refills | Status: AC

## 2024-04-24 MED ORDER — SAFETY SEAL MISCELLANEOUS MISC: 1.0000 | Freq: Every day | 3 refills | Status: AC

## 2024-04-24 NOTE — Progress Notes (Signed)
 New Patient Visit   Subjective  [name] is a 33 y.o. female who presents for the following: Rash of right lower leg x ~1 1/2. It came up after her first spinal surgery in 2023. It comes and goes and it is itchy. She thought it may be related to stress. She has used prescription strength hydrocortisone cream and it helped a little. When it flares the itch is about an 8 out 10. Today the itch is 1 out of 10. She has wart on her right thumb that has been there for years. She has tried several OTC treatments and they help but it just comes back. It snags on things and busts open and gets painful. She also has a rash on her left foot that has been there for ~ 2 weeks. It  started as blisters on the inside of her foot and it has spread around her heel. She has tried a prescription cream that she had and TMC 0.1% that her mother had and neither helped. She only used the TMC 0.1% cream twice.    The following portions of the chart were reviewed this encounter and updated as appropriate: medications, allergies, medical history  Review of Systems:  No other skin or systemic complaints except as noted in HPI or Assessment and Plan.  Objective  Well appearing patient in no apparent distress; mood and affect are within normal limits.   A focused examination was performed of the following areas: Right lower leg, left foot, right hand  Relevant exam findings are noted in the Assessment and Plan.  Right Palmar Proximal Thumb Verrucous papules   Assessment & Plan   1. Verruca vulgaris (wart) on right thumb - Assessment: Large verrucous papule on the palmar surface of the right thumb, present for a couple of years with cyclical pattern of flattening and recurrence. Caused by HPV virus, likely entered through a small cut in the skin.  - Plan:    Cryotherapy with liquid nitrogen applied to the wart    Application of cantharone (blistering agent) for 4 hours, then wash off    Prescribe wart pen  with antiviral ingredients for nightly application (or every other night if irritation occurs) after initial treatment heals (approximately 1 week)    Patient instructed to allow blister to form, then peel off skin when tolerable    Follow-up in 8 weeks for reassessment and potential retreatment  2. 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 Patient reports scratching until bleeding occurs. Examination shows remnants of rash consistent with nummular dermatitis. May be exacerbated by stress.  - Plan:    Prescribe triamcinolone  0.1% ointment, apply twice daily for 2 weeks, followed by 2 weeks off    Continue regimen until rash is clear and no longer itchy    Educate patient on importance of consistent moisturizer use to prevent flares    Advise on proper use of topical steroids, including importance of taking breaks to prevent skin thinning  3. Unspecified dermatitis on right foot - Assessment: New, intensely itchy rash on right foot developed within past two weeks. Differential diagnosis includes pompholyx (dyshidrotic eczema), tinea pedis, or bacterial infection (possibly bullous impetigo). Clusters of erythematous blisters on plantar aspect of right heel. Empiric treatment preferred over biopsy due to patient's anxiety about needles.  - Plan:    Prescribe combination of clotrimazole -betamethasone  cream and mupirocin  ointment, apply twice daily for 3 weeks  If rash resolves before 3 weeks, discontinue treatment    If rash persists, instruct patient to take 1-week break, then resume treatment for another 3-week cycle    Continue regimen until rash resolves    Follow-up in 8 weeks to assess response to treatment   VIRAL WARTS, UNSPECIFIED TYPE Right Palmar Proximal Thumb Start G Pen from [facility] at bedtime. Destruction of lesion - Right Palmar Proximal Thumb Complexity: simple   Destruction method: cryotherapy    Informed consent: discussed and consent obtained   Timeout:  patient name, date of birth, surgical site, and procedure verified Lesion destroyed using liquid nitrogen: Yes   Region frozen until ice ball extended beyond lesion: Yes   Outcome: patient tolerated procedure well with no complications   Post-procedure details: wound care instructions given    Destruction of lesion - Right Palmar Proximal Thumb  Destruction method: chemical removal   Informed consent: discussed and consent obtained   Timeout:  patient name, date of birth, surgical site, and procedure verified Chemical destruction method: cantharidin   Application time:  4 hours Procedure instructions: patient instructed to wash and dry area   Outcome: patient tolerated procedure well with no complications   Post-procedure details: wound care instructions given        Return in about 8 weeks (around 06/19/2024) for Follow up Wart and rash.  I, Roseline Hutchinson, CMA, am acting as scribe for [employer], DO .   Documentation: I have reviewed the above documentation for accuracy and completeness, and I agree with the above.  Delon Lenis, DO

## 2024-04-24 NOTE — Patient Instructions (Addendum)
 Date: Mon Apr 24 2024  [facility]  Thank you for visiting today. Here is a summary of the key instructions:  - Wart Treatment:   - Wash off cantharone medicine with soap and water at 3:00 PM today   - Put a band-aid on the treated area after washing   - Allow the area to blister over the next 48 hours   - Peel off the blistered skin when you can tolerate it   - Wait about a week before starting the prescription wart pad treatment  - Prescription Wart Pen:   - Apply the wart pen every night if tolerable   - Cover with a Band-Aid   - Wash off in the morning   - If skin gets irritated, use every other night instead  - Skin Rash Treatment:   - Apply triamcinolone  0.1% ointment to affected areas twice a day for 2 weeks   - Take a 2-week break, then repeat if needed   - Continue this cycle until the rash is no longer itchy and flat   - Use lots of moisturizer on the affected areas  - Foot Treatment:   - Apply a mixture of clotrimazole -betamethasone  cream and mupirocin  cream to the affected area on your foot twice a day for 3 weeks   - If the condition clears before 3 weeks, you can stop   - If not cleared after 3 weeks, take a 1-week break, then repeat the treatment   - Continue this 3-weeks-on, 1-week-off cycle until the condition is gone  - Follow-up:   - Return to the office in 8 weeks for a follow-up appointment   - We will check on your foot condition and do another wart treatment  Please reach out if you have any questions or concerns.  Warm regards,  Dr. Delon Lenis [facility]      Cryotherapy Aftercare  Wash gently with soap and water everyday.   Apply Vaseline and Band-Aid daily until healed.     Your provider has sent your prescription to [facility] in [place], [place] . A pharmacy representative will call you to confirm details and take your payment information. If you do not receive a call within 24 hours, please contact the pharmacy at  [phone] or [phone]. Your unique skincare compound is being formulated in our lab (most compounds take less than 24 hours). Your prescription is shipped vis USPS to your mailbox (2-4 business days). Priority shipping is available at an additional cost. Once received, you will electronically sign/acknowledge that you received your prescription. The pharmacy hours are Monday-Friday 9 am-6 pm EST and Saturday 9 am-1 pm EST.      Important Information  Due to recent changes in healthcare laws, you may see results of your pathology and/or laboratory studies on MyChart before the doctors have had a chance to review them. We understand that in some cases there may be results that are confusing or concerning to you. Please understand that not all results are received at the same time and often the doctors may need to interpret multiple results in order to provide you with the best plan of care or course of treatment. Therefore, we ask that you please give us  2 business days to thoroughly review all your results before contacting the office for clarification. Should we see a critical lab result, you will be contacted sooner.   If You Need Anything After Your Visit  If you have any questions or concerns for your doctor, please  call our main line at [phone] If no one answers, please leave a voicemail as directed and we will return your call as soon as possible. Messages left after 4 pm will be answered the following business day.   You may also send us  a message via MyChart. We typically respond to MyChart messages within 1-2 business days.  For prescription refills, please ask your pharmacy to contact our office. Our fax number is [phone].  If you have an urgent issue when the clinic is closed that cannot wait until the next business day, you can page your doctor at the number below.    Please note that while we do our best to be available for urgent issues outside of office hours, we are  not available 24/7.   If you have an urgent issue and are unable to reach us , you may choose to seek medical care at your doctor's office, retail clinic, urgent care center, or emergency room.  If you have a medical emergency, please immediately call 911 or go to the emergency department. In the event of inclement weather, please call our main line at [phone] for an update on the status of any delays or closures.  Dermatology Medication Tips: Please keep the boxes that topical medications come in in order to help keep track of the instructions about where and how to use these. Pharmacies typically print the medication instructions only on the boxes and not directly on the medication tubes.   If your medication is too expensive, please contact our office at [phone] or send us  a message through MyChart.   We are unable to tell what your co-pay for medications will be in advance as this is different depending on your insurance coverage. However, we may be able to find a substitute medication at lower cost or fill out paperwork to get insurance to cover a needed medication.   If a prior authorization is required to get your medication covered by your insurance company, please allow us  1-2 business days to complete this process.  Drug prices often vary depending on where the prescription is filled and some pharmacies may offer cheaper prices.  The website www.goodrx.com contains coupons for medications through different pharmacies. The prices here do not account for what the cost may be with help from insurance (it may be cheaper with your insurance), but the website can give you the price if you did not use any insurance.  - You can print the associated coupon and take it with your prescription to the pharmacy.  - You may also stop by our office during regular business hours and pick up a GoodRx coupon card.  - If you need your prescription sent electronically to a different pharmacy,  notify our office through [facility] or by phone at [phone]

## 2024-04-24 NOTE — Telephone Encounter (Signed)
 [facility] reached. I verified that it is the G-Pen that needs to be prescribed.

## 2024-04-25 ENCOUNTER — Encounter: Payer: Self-pay | Admitting: Nurse Practitioner

## 2024-04-25 ENCOUNTER — Ambulatory Visit (INDEPENDENT_AMBULATORY_CARE_PROVIDER_SITE_OTHER): Admitting: Nurse Practitioner

## 2024-04-25 VITALS — BP 120/80 | HR 80 | Ht 63.0 in | Wt 176.8 lb

## 2024-04-25 DIAGNOSIS — E282 Polycystic ovarian syndrome: Secondary | ICD-10-CM

## 2024-04-25 DIAGNOSIS — E785 Hyperlipidemia, unspecified: Secondary | ICD-10-CM

## 2024-04-25 DIAGNOSIS — Z6831 Body mass index (BMI) 31.0-31.9, adult: Secondary | ICD-10-CM

## 2024-04-25 MED ORDER — DROSPIRENONE-ETHINYL ESTRADIOL 3-0.02 MG PO TABS: 1.0000 | ORAL_TABLET | Freq: Every day | ORAL | 0 refills | Status: DC

## 2024-04-25 MED ORDER — METFORMIN HCL 500 MG PO TABS: 500.0000 mg | ORAL_TABLET | Freq: Two times a day (BID) | ORAL | 0 refills | Status: DC

## 2024-04-25 NOTE — Progress Notes (Signed)
   Acute Office Visit  Subjective:    Patient ID: [name], female    DOB: [birth year], 33 y.o.   MRN: [id number]   HPI 33 y.o. presents today for PCOS follow up. Diagnosed with PCOS in April after complaints of irregular periods, female pattern hair growth, difficulty losing weight. Elevated free and total testosterone levels. Started COCs at that time. Has had improvements in cycles but still has pelvic pain prior to menses each month. Normal A1c in January. Concerned with her weight. Seeing nutritionist since April, down 11 pounds. Going to the gym daily, even going twice a day 3 days a week, has trainer, doing mix of cardio and weight training. Reports her scale is showing she is gaining weight.   Patient's last menstrual period was 04/21/2024. Period Cycle (Days): 28 Period Duration (Days): 2-3 days Period Pattern: Regular Menstrual Flow: Moderate Menstrual Control: Tampon Menstrual Control Change Freq (Hours): 3-4 hrs Dysmenorrhea: (!) Moderate Dysmenorrhea Symptoms: Cramping, Diarrhea, Other (Comment)  Review of Systems  Constitutional: Negative.   Genitourinary: Negative.        Objective:    Physical Exam Constitutional:      Appearance: Normal appearance.   GU: Not indicated  BP 120/80   Pulse 80   Ht 5' 3 (1.6 m)   Wt 176 lb 12.8 oz (80.2 kg)   LMP 04/21/2024   SpO2 99%   BMI 31.32 kg/m  Wt Readings from Last 3 Encounters:  04/25/24 176 lb 12.8 oz (80.2 kg)  03/17/24 174 lb (78.9 kg)  03/15/24 177 lb 3.2 oz (80.4 kg)         Assessment & Plan:   Problem List Items Addressed This Visit   None Visit Diagnoses       PCOS (polycystic ovarian syndrome)    -  Primary   Relevant Medications   metFORMIN  (GLUCOPHAGE ) 500 MG tablet   drospirenone -ethinyl estradiol  (YAZ) 3-0.02 MG tablet     BMI 31.0-31.9,adult         Hyperlipidemia, unspecified hyperlipidemia type          Plan: Improvement in menses with COCs. Discussed weight management and  congratulated on current weight loss. Continue seeing nutritionist and going to gym regularly. Will start Metformin . Begin daily with breakfast and increase as tolerated to twice a day with meals. Aware of potential GI symptoms.   Return in about 3 months (around 07/26/2024) for Med follow up, PCOS.    Sonya DELENA Shutter DNP, 11:40 AM 04/25/2024

## 2024-05-02 ENCOUNTER — Encounter: Payer: Self-pay | Admitting: Dietician

## 2024-05-02 ENCOUNTER — Encounter: Attending: Emergency Medicine | Admitting: Dietician

## 2024-05-02 VITALS — Wt 177.9 lb

## 2024-05-02 DIAGNOSIS — Z6832 Body mass index (BMI) 32.0-32.9, adult: Secondary | ICD-10-CM | POA: Diagnosis present

## 2024-05-02 DIAGNOSIS — E66811 Obesity, class 1: Secondary | ICD-10-CM | POA: Diagnosis present

## 2024-05-02 DIAGNOSIS — E6609 Other obesity due to excess calories: Secondary | ICD-10-CM | POA: Insufficient documentation

## 2024-05-02 NOTE — Patient Instructions (Signed)
 Assessment of Previous Goals:   Goal 1: Go walking 2-3 times per week for 30 minutes. - goal met, continue gym routine!  Goal 2: Aim to drink at least 2 water bottles daily. - goal in progress, continue.

## 2024-05-02 NOTE — Progress Notes (Signed)
 Medical Nutrition Therapy  Appointment Start time:  55  Appointment End time: 1215  Primary concerns today: healthy eating   Referral diagnosis: E66.9 Preferred learning style: no preference indicated Learning readiness: ready   NUTRITION ASSESSMENT   Anthropometrics   Wt 05/02/24: 177.9 lb Wt 03/02/24: 180.7 lb Wt 01/18/24: 187.5 lb  Clinical Medical Hx: anxiety, depression, hyperlipidemia, IBS, PCOS Medications: reviewed Labs: 11/15/23: chol 235, LDL 146,  Notable Signs/Symptoms: GI discomfort Food Allergies: none reported  Lifestyle & Dietary Hx  Pt reports she is still dealing with back pain. Pt reports frustration around this because she feels she has not gotten answers as to why she is experiencing so much pain.   Pt reports she is still managing one car between herself, her partner, and taking her daughter places.   Pt reports she started going to [employer] 3-5 days a week. Pt reports she got a trainer to help create a plan. Pt states M, W, F she goes to the gym with fiance in the evening to do strength training, and she may also do 30 minutes of cardio in the morning. Pt reports she has been doing this 1.5 months.  Pt reports she tried spring mix and liked it.   Pt states she has been trying to work on water intake but is still averaging about 1 bottle per day.   Estimated daily fluid intake: 16 oz Supplements: none reported Sleep: 6 hours.  Stress / self-care: high stress  Current average weekly physical activity: gym 3-5 days per week.   24-Hr Dietary Recall First Meal: none Snack: none Second Meal: can't remember Snack: smore Third Meal: hoagie sub OR chicken pot pie Snack: none Beverages: sweet tea, koolaid, water   NUTRITION DIAGNOSIS  NB-1.1 Food and nutrition-related knowledge deficit As related to lack of prior education by a registered dietitian.  As evidenced by pt report.   NUTRITION INTERVENTION  Nutrition education (E-1) on the  following topics:   Hydration Adequate hydration is crucial for optimal nutrition and overall health. It aids in the digestion and absorption of nutrients, ensuring the body can effectively process and transport them to cells. Water also supports detoxification by helping the kidneys flush out waste, while maintaining hydration promotes energy levels and metabolism. Proper hydration helps control appetite and ensures a healthy balance of electrolytes, which are vital for muscle function and cellular processes. Overall, staying well-hydrated is essential for the body to utilize nutrients efficiently and maintain balance.  Physical Activity Finding an exercise you enjoy is crucial for maintaining long-term fitness and overall health. Enjoyable activities are more likely to become regular habits, making it easier to stay consistent with physical activity. When you look forward to your workouts, exercise becomes a positive experience rather than a chore, reducing the likelihood of burnout or quitting. Enjoyable exercise also enhances mental well-being, as engaging in activities you love can boost mood, reduce stress, and provide a sense of accomplishment.   Plate Method Fruits & Vegetables: Aim to fill half your plate with a variety of fruits and vegetables. They are rich in vitamins, minerals, and fiber, and can help reduce the risk of chronic diseases. Choose a colorful assortment of fruits and vegetables to ensure you get a wide range of nutrients. Grains and Starches: Make at least half of your grain choices whole grains, such as brown rice, whole wheat bread, and oats. Whole grains provide fiber, which aids in digestion and healthy cholesterol levels. Aim for whole forms of starchy  vegetables such as potatoes, sweet potatoes, beans, peas, and corn, which are fiber rich and provide many vitamins and minerals.  Protein: Incorporate lean sources of protein, such as poultry, fish, beans, nuts, and seeds,  into your meals. Protein is essential for building and repairing tissues, staying full, balancing blood sugar, as well as supporting immune function. Dairy: Include low-fat or fat-free dairy products like milk, yogurt, and cheese in your diet. Dairy foods are excellent sources of calcium and vitamin D, which are crucial for bone health.   Cholesterol Nutrition Education Soluble fiber and impact on lowering cholesterol: Sources: Oats, barley, beans, lentils, fruits (apples, oranges), vegetables (carrots, broccoli), and flaxseeds. Benefits: Soluble fiber reduces the absorption of cholesterol into your bloodstream. Choose Healthy Unsaturated Fats and Omega 3's. Sources: Olive oil, canola oil, avocados, nuts, and Fatty fish (salmon, trout), walnuts, flaxseeds, and chia seeds. Benefits: Helps raise HDL cholesterol and lower LDL cholesterol. Omega-3s help reduce triglycerides and raise HDL cholesterol. Limit Saturated Fats: Sources: Red meat, full-fat dairy products, butter, and coconut oil. Benefits: Reducing intake helps lower LDL cholesterol levels. Eat Plenty of Fruits and Vegetables. Benefits: High in fiber and antioxidants, which help lower LDL cholesterol. Eat Whole Grains. Sources: Brown rice, whole wheat bread, quinoa, and whole grain pasta. Benefits: Whole grains contain more fiber and nutrients than refined grains.   Handouts Provided Include  Plate Method Food Assistance Resources  Learning Style & Readiness for Change Teaching method utilized: Visual & Auditory  Demonstrated degree of understanding via: Teach Back  Barriers to learning/adherence to lifestyle change: none  Goals Established by Pt (pt wants to continue with previous goals)  Assessment of Previous Goals:   Goal 1: Go walking 2-3 times per week for 30 minutes. - goal met, continue gym routine!  Goal 2: Aim to drink at least 2 water bottles daily. - goal in progress, continue.    MONITORING & EVALUATION Dietary  intake, weekly physical activity, and follow up in 2 months.  Next Steps  Patient is to call for questions.

## 2024-05-16 ENCOUNTER — Encounter: Payer: Self-pay | Admitting: Dermatology

## 2024-05-16 NOTE — Telephone Encounter (Signed)
 Per the photo the wart is still there.  She should continue the wart pen until her follow up in september

## 2024-06-06 ENCOUNTER — Ambulatory Visit (INDEPENDENT_AMBULATORY_CARE_PROVIDER_SITE_OTHER): Admitting: Clinical

## 2024-06-06 DIAGNOSIS — F4389 Other reactions to severe stress: Secondary | ICD-10-CM

## 2024-06-06 DIAGNOSIS — F439 Reaction to severe stress, unspecified: Secondary | ICD-10-CM

## 2024-06-06 DIAGNOSIS — F419 Anxiety disorder, unspecified: Secondary | ICD-10-CM | POA: Diagnosis not present

## 2024-06-06 DIAGNOSIS — F3189 Other bipolar disorder: Secondary | ICD-10-CM | POA: Diagnosis not present

## 2024-06-06 NOTE — Progress Notes (Signed)
   Sonya Seats, LCSW

## 2024-06-06 NOTE — Progress Notes (Signed)
 [facility] Counselor/Therapist Progress Note  Patient ID: [name], MRN: [id number],    Date: 06/06/2024  Time Spent: 10:38am -11:45am :  67 minutes  Treatment Type: Individual Therapy  Reported Symptoms: Patient reported depressed mood yesterday, irritability at times  Mental Status Exam: Appearance:  Neat and Well Groomed     Behavior: Appropriate  Motor: Normal  Speech/Language:  Clear and Coherent and Normal Rate  Affect: Appropriate  Mood: normal  Thought process: normal  Thought content:   WNL  Sensory/Perceptual disturbances:   WNL  Orientation: oriented to person, place, time/date, and situation  Attention: Good  Concentration: Good  Memory: WNL  Fund of knowledge:  Good  Insight:   Good  Judgment:  Good  Impulse Control: Good   Risk Assessment: Danger to Self:  No Patient denied current suicidal ideation  Self-injurious Behavior: No Danger to Others: No Patient denied current homicidal ideation Duty to Warn:no Physical Aggression / Violence:No  Access to Firearms a concern: No  Gang Involvement:No   Subjective: Patient reported patient has attended multiple medical appointments since last session and is in the process of filing for disability. Patient stated, I'm 24/7 constantly in pain. Patient stated, I get grumpy fairly easy. Patient stated, It holds me back in reference to chronic pain. Patient reported irritability when feeling overstimulated. Patient stated, I think for the most part I'm able to keep it level in reference to patient's mood. Patient stated, today I'm ok in reference to patient's current mood. Patient reported depressed mood for the  day and a half prior to today and reported depressed mood related to challenges making bread. Patient stated, I got upset because it didn't work even though I followed directions. Patient stated, I feel like I don't deserve love or affection in reference to difficulty with tasks.  Patient reported a historical diagnosis of Generalized Anxiety Disorder. Patient reported a history of anxiety in response to multiple situations and was recommended patient be evaluated for borderline personality disorder. Patient stated, the trauma one makes sense, I have a lot of stuff blocked out in response to diagnosis. Patient stated, Its a little scary I'm not going to lie in reference to referral to clinician with expertise in treatment of trauma. Patient reported patient is open to referral to clinician with expertise in treatment of trauma. Patient reported patient has a follow up appointment with current psychiatrist.   Interventions: Motivational Interviewing. Clinician conducted session in person at clinician's office at [facility]. Reviewed events since last session and assessed for changes. Clinician reviewed diagnoses and treatment recommendations. Provided psycho education related to diagnoses and treatment. Discussed clinician's scope of practice and referral to a provider with expertise in treatment of trauma. Clinician will initiate a referral to a provider with expertise in treatment of trauma.   Collaboration of Care: Referral or follow-up with counselor/therapist AEB Discussed consent required for referral to Myrick Rummage, MSW, LCSW-A  Patient/Guardian was advised Release of Information must be obtained prior to any record release in order to collaborate their care with an outside provider. Patient/Guardian was advised if they have not already done so to contact [facility] to sign all necessary forms in order for us  to release information regarding their care.   Consent: Patient/Guardian gives verbal consent for treatment and assignment of benefits for services provided during this visit. Patient/Guardian expressed understanding and agreed to proceed.   Diagnoses:  Hypomanic episodes with insufficient symptoms and major depressive episodes -  Other  bipolar disorder (HCC)   Anxiety disorder, unspecified type   Trauma and stressor-related disorder  Plan: Clinician will initiate a referral to a provider with expertise in treatment of trauma.  Darice Seats, LCSW

## 2024-06-08 ENCOUNTER — Ambulatory Visit: Payer: Medicaid Other | Admitting: Dermatology

## 2024-06-28 ENCOUNTER — Encounter: Payer: Self-pay | Admitting: Dermatology

## 2024-06-28 ENCOUNTER — Ambulatory Visit: Admitting: Dermatology

## 2024-06-28 DIAGNOSIS — B079 Viral wart, unspecified: Secondary | ICD-10-CM | POA: Diagnosis not present

## 2024-06-28 NOTE — Patient Instructions (Addendum)

## 2024-06-28 NOTE — Progress Notes (Signed)
   Follow-Up Visit   Subjective  [name] is a 33 y.o. female who presents for the following: Wart & Dermatitis  Patient present today for follow up visit for wart. Patient was last evaluated on 04/24/24. At this visit patient was prescribed the Wart pen to apply nightly from [facility] & also had Cryo therapy and cantharone. Patient reports sxs are Improving but not at goal. Patient denies medication changes.  At this visit patient also had foot dermatitis. She was prescribed clotrimazole /betamethasone  cream to mix with mupirocin  ointment  to apply 2 times daily for 3 weeks then stopping. Advised to apply 3 weeks on and 3 weeks off. Patient reports her sxs have resolved.  The following portions of the chart were reviewed this encounter and updated as appropriate: medications, allergies, medical history  Review of Systems:  No other skin or systemic complaints except as noted in HPI or Assessment and Plan.  Objective  Well appearing patient in no apparent distress; mood and affect are within normal limits.  A focused examination was performed of the following areas: Right Thumb, Left Foot  Relevant exam findings are noted in the Assessment and Plan.         Right Palmar Proximal Thumb Verrucous papules   Assessment & Plan   1. Warts - Assessment: Patient reports improvement in wart condition. Treatment adherence has been inconsistent due to life stressors, including adjusting to child's school schedule and transportation issues. Patient admits to occasionally forgetting to apply treatment, particularly late at night. - Plan:    Continue wart pen treatment as prescribed    Applied treatment in office; patient instructed to leave on for 4 hours    Educate patient on importance of adherence to prevent complications    Follow up as scheduled  VIRAL WARTS, UNSPECIFIED TYPE Right Palmar Proximal Thumb Start G Pen from [facility] at bedtime. Destruction of lesion - Right  Palmar Proximal Thumb Complexity: simple   Destruction method: cryotherapy   Informed consent: discussed and consent obtained   Timeout:  patient name, date of birth, surgical site, and procedure verified Cryotherapy cycles:  1  Destruction of lesion - Right Palmar Proximal Thumb Complexity: simple   Destruction method: chemical removal   Timeout:  patient name, date of birth, surgical site, and procedure verified Chemical destruction method: cantharidin   Application time:  5 seconds Procedure instructions: patient instructed to wash and dry area   Outcome: patient tolerated procedure well with no complications   Post-procedure details: wound care instructions given     Return in about 8 weeks (around 08/23/2024) for Wart F/U.  I, Jetta Ager, am acting as Neurosurgeon for [employer], DO.  Documentation: I have reviewed the above documentation for accuracy and completeness, and I agree with the above.  Delon Lenis, DO

## 2024-06-29 ENCOUNTER — Encounter: Payer: Self-pay | Admitting: Dermatology

## 2024-07-04 ENCOUNTER — Encounter: Payer: Self-pay | Admitting: Dietician

## 2024-07-04 ENCOUNTER — Encounter: Attending: Emergency Medicine | Admitting: Dietician

## 2024-07-04 DIAGNOSIS — Z6832 Body mass index (BMI) 32.0-32.9, adult: Secondary | ICD-10-CM | POA: Diagnosis present

## 2024-07-04 DIAGNOSIS — E66811 Obesity, class 1: Secondary | ICD-10-CM | POA: Insufficient documentation

## 2024-07-04 DIAGNOSIS — E6609 Other obesity due to excess calories: Secondary | ICD-10-CM | POA: Insufficient documentation

## 2024-07-04 NOTE — Progress Notes (Signed)
 Medical Nutrition Therapy  Appointment Start time:  45  Appointment End time: 1210  Primary concerns today: healthy eating   Referral diagnosis: E66.9 Preferred learning style: no preference indicated Learning readiness: ready   NUTRITION ASSESSMENT   Anthropometrics   Wt 07/04/24: virtual visit (reports fluctuating between 172-175 lb) Wt 05/02/24: 177.9 lb Wt 03/02/24: 180.7 lb Wt 01/18/24: 187.5 lb  Clinical Medical Hx: anxiety, depression, hyperlipidemia, IBS, PCOS Medications: reviewed Labs: 11/15/23: chol 235, LDL 146,  Notable Signs/Symptoms: GI discomfort Food Allergies: none reported  Lifestyle & Dietary Hx  Pt reports she had to switch to virtual due to car issues. Pt states her car is broken down, currently making this difficult with work, child, and gym access.   Pt states she has been trying to buy healthier snacks, emphasizing fruits such as grapes.   Pt reports she has increased water intake from 1 bottle to 5 bottles most days, and adds mio flavor. Pt reports she has decreased soda and sweet tea intake. Pt reports she has been having 2 sodas per week.   Pt states she has been logging what she eats and drinks and has found it helps keep her on track.   Estimated daily fluid intake: 64 oz Supplements: none reported Sleep: 6 hours.  Stress / self-care: high stress  Current average weekly physical activity: gym 3 days per week, just stopped going due to car being broken down. Plans to walk.   24-Hr Dietary Recall First Meal: none Snack: none Second Meal: [place] sandwich and chips Snack: grapes Third Meal: spaghetti OR chicken pot pie Snack: brownie with ice cream Beverages: 3-5 16 oz bottles water daily with flavoring, 2 sodas a week, 1 c tea with dinner   NUTRITION DIAGNOSIS  NB-1.1 Food and nutrition-related knowledge deficit As related to lack of prior education by a registered dietitian.  As evidenced by pt report.   NUTRITION INTERVENTION   Nutrition education (E-1) on the following topics:   Hydration Adequate hydration is crucial for optimal nutrition and overall health. It aids in the digestion and absorption of nutrients, ensuring the body can effectively process and transport them to cells. Water also supports detoxification by helping the kidneys flush out waste, while maintaining hydration promotes energy levels and metabolism. Proper hydration helps control appetite and ensures a healthy balance of electrolytes, which are vital for muscle function and cellular processes. Overall, staying well-hydrated is essential for the body to utilize nutrients efficiently and maintain balance.  Physical Activity Finding an exercise you enjoy is crucial for maintaining long-term fitness and overall health. Enjoyable activities are more likely to become regular habits, making it easier to stay consistent with physical activity. When you look forward to your workouts, exercise becomes a positive experience rather than a chore, reducing the likelihood of burnout or quitting. Enjoyable exercise also enhances mental well-being, as engaging in activities you love can boost mood, reduce stress, and provide a sense of accomplishment.   Plate Method Fruits & Vegetables: Aim to fill half your plate with a variety of fruits and vegetables. They are rich in vitamins, minerals, and fiber, and can help reduce the risk of chronic diseases. Choose a colorful assortment of fruits and vegetables to ensure you get a wide range of nutrients. Grains and Starches: Make at least half of your grain choices whole grains, such as brown rice, whole wheat bread, and oats. Whole grains provide fiber, which aids in digestion and healthy cholesterol levels. Aim for whole forms of starchy  vegetables such as potatoes, sweet potatoes, beans, peas, and corn, which are fiber rich and provide many vitamins and minerals.  Protein: Incorporate lean sources of protein, such as  poultry, fish, beans, nuts, and seeds, into your meals. Protein is essential for building and repairing tissues, staying full, balancing blood sugar, as well as supporting immune function. Dairy: Include low-fat or fat-free dairy products like milk, yogurt, and cheese in your diet. Dairy foods are excellent sources of calcium and vitamin D, which are crucial for bone health.   Cholesterol Nutrition Education Soluble fiber and impact on lowering cholesterol: Sources: Oats, barley, beans, lentils, fruits (apples, oranges), vegetables (carrots, broccoli), and flaxseeds. Benefits: Soluble fiber reduces the absorption of cholesterol into your bloodstream. Choose Healthy Unsaturated Fats and Omega 3's. Sources: Olive oil, canola oil, avocados, nuts, and Fatty fish (salmon, trout), walnuts, flaxseeds, and chia seeds. Benefits: Helps raise HDL cholesterol and lower LDL cholesterol. Omega-3s help reduce triglycerides and raise HDL cholesterol. Limit Saturated Fats: Sources: Red meat, full-fat dairy products, butter, and coconut oil. Benefits: Reducing intake helps lower LDL cholesterol levels. Eat Plenty of Fruits and Vegetables. Benefits: High in fiber and antioxidants, which help lower LDL cholesterol. Eat Whole Grains. Sources: Brown rice, whole wheat bread, quinoa, and whole grain pasta. Benefits: Whole grains contain more fiber and nutrients than refined grains.   Handouts Provided Include  Plate Method Food Assistance Resources  Learning Style & Readiness for Change Teaching method utilized: Visual & Auditory  Demonstrated degree of understanding via: Teach Back  Barriers to learning/adherence to lifestyle change: none  Goals Established by Pt (pt wants to continue with previous goals)  Assessment of Previous Goals:   Goal 1: Go walking 2-3 times per week for 30 minutes. - goal met, changed goal.   Goal 2: Aim to drink at least 2 water bottles daily. - goal met! Drinking around 5 bottles.  Continue!  New Goal:   Goal 1: walk for 45 minutes 3 days a week.   MONITORING & EVALUATION Dietary intake, weekly physical activity, and follow up in 2 months.  Next Steps  Patient is to call for questions.

## 2024-07-26 ENCOUNTER — Ambulatory Visit: Admitting: Nurse Practitioner

## 2024-07-26 ENCOUNTER — Encounter: Payer: Self-pay | Admitting: Nurse Practitioner

## 2024-07-26 VITALS — BP 112/70 | HR 94 | Wt 178.0 lb

## 2024-07-26 DIAGNOSIS — Z6831 Body mass index (BMI) 31.0-31.9, adult: Secondary | ICD-10-CM

## 2024-07-26 DIAGNOSIS — N6452 Nipple discharge: Secondary | ICD-10-CM | POA: Diagnosis not present

## 2024-07-26 DIAGNOSIS — E282 Polycystic ovarian syndrome: Secondary | ICD-10-CM | POA: Diagnosis not present

## 2024-07-26 MED ORDER — DROSPIRENONE-ETHINYL ESTRADIOL 3-0.02 MG PO TABS: 1.0000 | ORAL_TABLET | Freq: Every day | ORAL | 1 refills | Status: AC

## 2024-07-26 MED ORDER — METFORMIN HCL 500 MG PO TABS: 500.0000 mg | ORAL_TABLET | Freq: Two times a day (BID) | ORAL | 1 refills | Status: DC

## 2024-07-26 NOTE — Progress Notes (Signed)
   Acute Office Visit  Subjective:    Patient ID: [name], female    DOB: 10-18-1991, 33 y.o.   MRN: [id number]   HPI 33 y.o. presents today for 32-month follow up for PCOS. Diagnosed with PCOS in April after complaints of irregular periods, female pattern hair growth, difficulty losing weight. Elevated free and total testosterone levels. Started COCs at that time. Has had improvements in cycles. Normal A1c in January.  Started Metformin  in July. Not consistent with taking twice a day due to not eating breakfast each day but does take at least daily. Seeing nutritionist for weight management. Reports occasional very small amount of bilateral nipple discharge for years with expression, usually during intimacy. Denies lumps, pain, skin changes. Discharge is usually a greyish color.   Patient's last menstrual period was 07/19/2024 (exact date). Period Duration (Days): 5 Period Pattern: Regular Menstrual Flow: Light Menstrual Control: Tampon Dysmenorrhea: (!) Mild Dysmenorrhea Symptoms: Cramping, Nausea, Other (Comment) (constipation)  Review of Systems  Constitutional: Negative.   Genitourinary: Negative.   Right breast: Positive for nipple discharge. Negative for mass, pain, skin changes.  Left breast: Positive for nipple discharge. Negative for mass, pain, skin changes.      Objective:    Physical Exam Constitutional:      Appearance: Normal appearance.   GU: Not indicated  BP 112/70   Pulse 94   Wt 178 lb (80.7 kg)   LMP 07/19/2024 (Exact Date)   SpO2 99%   BMI 31.53 kg/m  Wt Readings from Last 3 Encounters:  07/26/24 178 lb (80.7 kg)  05/02/24 177 lb 14.4 oz (80.7 kg)  04/25/24 176 lb 12.8 oz (80.2 kg)         Assessment & Plan:   Problem List Items Addressed This Visit   None Visit Diagnoses       PCOS (polycystic ovarian syndrome)    -  Primary   Relevant Medications   drospirenone -ethinyl estradiol  (YAZ) 3-0.02 MG tablet   metFORMIN  (GLUCOPHAGE ) 500 MG  tablet     BMI 31.0-31.9,adult         Nipple discharge          Plan: Doing well on OCPs and Metformin . Continue this, may add Inositol daily. Continue seeing nutritionist for weight management. Reassured nipple discharge can occur with PCOS. Will monitor. If she experiences any new symptoms, imaging is recommended.   Return if symptoms worsen or fail to improve.    [name] DNP, 10:52 AM 07/26/2024

## 2024-08-02 ENCOUNTER — Telehealth: Payer: Self-pay

## 2024-08-02 NOTE — Telephone Encounter (Signed)
 Copied from CRM #[id number]. Topic: Referral - Request for Referral >> Aug 02, 2024 11:45 AM Mercedes MATSU wrote: Did the patient discuss referral with their provider in the last year? Yes (If No - schedule appointment) (If Yes - send message)  Appointment offered? Yes  Type of order/referral and detailed reason for visit: Pain Management   Preference of office, provider, location: [facility] ; [place] Location  If referral order, have you been seen by this specialty before? Yes (If Yes, this issue or another issue? When? Where?  Can we respond through MyChart? Yes or call at [phone] voice mail is secure for message she is requesting a follow up referral is sent.

## 2024-08-23 ENCOUNTER — Ambulatory Visit: Admitting: Dermatology

## 2024-09-05 ENCOUNTER — Ambulatory Visit: Payer: Self-pay

## 2024-09-05 ENCOUNTER — Telehealth: Payer: Self-pay

## 2024-09-05 DIAGNOSIS — G8929 Other chronic pain: Secondary | ICD-10-CM

## 2024-09-05 NOTE — Telephone Encounter (Signed)
 Copied from CRM [id number]. Topic: Referral - Question >> Sep 05, 2024  3:53 PM [name] wrote: Reason for CRM: Patient is calling back to follow up on referral request that she made back on 10/15 in regards to Pain Management with [facility]. Patient has not heard anything back yet and I don't see a referral in the patient's chart at this moment for pain management.   Patient really needs an appointment with pain management. And, her neurosurgeon provider wont give anymore oxycodone  5mg  unless she see's pain managemen for an appointment.    [phone] (M) Patient's contact

## 2024-09-05 NOTE — Telephone Encounter (Signed)
 Patient would need to ask neurosurgeon for a referral?

## 2024-09-05 NOTE — Telephone Encounter (Signed)
 Either office can place pain management referral.  Go ahead and place it please.

## 2024-09-05 NOTE — Telephone Encounter (Signed)
 FYI Only or Action Required?: Action required by provider: referral request.  Patient was last seen in primary care on 01/19/2024 by Purcell Emil Schanz, MD.  Called Nurse Triage reporting Back Pain.  Symptoms began several years ago.  Interventions attempted: Prescription medications: Oxycodone  .  Symptoms are: rapidly worsening.  Triage Disposition: Information or Advice Only Call  Patient/caregiver understands and will follow disposition?: Yes Reason for Disposition  Health information question, no triage required and triager able to answer question  Answer Assessment - Initial Assessment Questions Patient has been seeing a neurosurgeon for the last 4 years for chronic back pain, spine is stable and they are unsure where the pain is coming from. Advised to call PCP to ask for a referral to [facility] for pain management. Patient states she submitted a request on 10/15 for referral and a note was put back stating it was for dermatology but this was a mistake, she did not ask for a derm referral. Please advise.    1. REASON FOR CALL: What is the main reason for your call? or How can I best help you?     Needs referral to pain management  2. SYMPTOMS : Do you have any symptoms?      Chronic back pain that radiates to hips and legs  Protocols used: Information Only Call - No Triage-A-AH  Copied from CRM [id number]. Topic: Clinical - Red Word Triage >> Sep 05, 2024  3:59 PM [name] wrote: Red Word that prompted transfer to Nurse Triage: Severe pain- back, legs, hips all lower area and has been without pain medication for the last two and a half weeks. Patient has difficulty with standing, and doing things around the house.

## 2024-09-06 NOTE — Telephone Encounter (Signed)
 I have placed a referral for pain management

## 2024-09-06 NOTE — Telephone Encounter (Signed)
 I did place a referral

## 2024-09-22 ENCOUNTER — Other Ambulatory Visit: Payer: Self-pay | Admitting: Nurse Practitioner

## 2024-09-22 ENCOUNTER — Encounter: Payer: Self-pay | Admitting: Nurse Practitioner

## 2024-09-22 DIAGNOSIS — E282 Polycystic ovarian syndrome: Secondary | ICD-10-CM

## 2024-09-22 MED ORDER — METFORMIN HCL ER 500 MG PO TB24: 500.0000 mg | ORAL_TABLET | Freq: Every day | ORAL | 2 refills | Status: AC

## 2024-09-22 NOTE — Telephone Encounter (Signed)
 Hx PCOS, last OV 07/26/24

## 2024-11-01 ENCOUNTER — Encounter: Payer: Self-pay | Admitting: *Deleted

## 2024-11-02 ENCOUNTER — Encounter: Payer: Self-pay | Admitting: *Deleted

## 2024-11-29 ENCOUNTER — Ambulatory Visit: Admitting: Nurse Practitioner

## 2025-01-11 ENCOUNTER — Ambulatory Visit: Admitting: Nurse Practitioner

## 2025-01-18 ENCOUNTER — Ambulatory Visit: Admitting: Dermatology
